# Patient Record
Sex: Female | Born: 1960 | Race: White | Hispanic: No | Smoking: Former smoker
Health system: Southern US, Community
[De-identification: ages and names within clinical notes are randomized; demographics above are authoritative.]

## PROBLEM LIST (undated history)

## (undated) DIAGNOSIS — N3281 Overactive bladder: Secondary | ICD-10-CM

## (undated) DIAGNOSIS — E119 Type 2 diabetes mellitus without complications: Secondary | ICD-10-CM

## (undated) DIAGNOSIS — C4490 Unspecified malignant neoplasm of skin, unspecified: Secondary | ICD-10-CM

## (undated) DIAGNOSIS — F419 Anxiety disorder, unspecified: Secondary | ICD-10-CM

## (undated) DIAGNOSIS — F411 Generalized anxiety disorder: Secondary | ICD-10-CM

## (undated) DIAGNOSIS — G4730 Sleep apnea, unspecified: Secondary | ICD-10-CM

## (undated) DIAGNOSIS — E785 Hyperlipidemia, unspecified: Secondary | ICD-10-CM

## (undated) DIAGNOSIS — R42 Dizziness and giddiness: Secondary | ICD-10-CM

## (undated) DIAGNOSIS — S82899A Other fracture of unspecified lower leg, initial encounter for closed fracture: Secondary | ICD-10-CM

## (undated) DIAGNOSIS — Z972 Presence of dental prosthetic device (complete) (partial): Secondary | ICD-10-CM

## (undated) HISTORY — PX: BASAL CELL CARCINOMA EXCISION: SHX1214

## (undated) HISTORY — DX: Other fracture of unspecified lower leg, initial encounter for closed fracture: S82.899A

## (undated) HISTORY — DX: Overactive bladder: N32.81

---

## 2004-06-30 ENCOUNTER — Ambulatory Visit: Payer: Self-pay

## 2007-12-03 ENCOUNTER — Ambulatory Visit: Payer: Self-pay

## 2007-12-28 ENCOUNTER — Emergency Department: Payer: Self-pay | Admitting: Internal Medicine

## 2008-01-31 ENCOUNTER — Ambulatory Visit: Payer: Self-pay | Admitting: Unknown Physician Specialty

## 2008-02-06 ENCOUNTER — Ambulatory Visit: Payer: Self-pay | Admitting: Unknown Physician Specialty

## 2008-07-13 ENCOUNTER — Ambulatory Visit: Payer: Self-pay | Admitting: Unknown Physician Specialty

## 2008-07-16 ENCOUNTER — Inpatient Hospital Stay: Payer: Self-pay | Admitting: Unknown Physician Specialty

## 2008-09-11 HISTORY — PX: TOTAL ABDOMINAL HYSTERECTOMY: SHX209

## 2008-09-11 HISTORY — PX: ABDOMINAL HYSTERECTOMY: SHX81

## 2008-11-14 ENCOUNTER — Emergency Department: Payer: Self-pay | Admitting: Emergency Medicine

## 2010-05-06 ENCOUNTER — Ambulatory Visit (HOSPITAL_BASED_OUTPATIENT_CLINIC_OR_DEPARTMENT_OTHER): Admission: RE | Admit: 2010-05-06 | Discharge: 2010-05-06 | Payer: Self-pay | Admitting: Orthopedic Surgery

## 2010-09-11 DIAGNOSIS — C4490 Unspecified malignant neoplasm of skin, unspecified: Secondary | ICD-10-CM

## 2010-09-11 HISTORY — DX: Unspecified malignant neoplasm of skin, unspecified: C44.90

## 2010-11-25 LAB — POCT HEMOGLOBIN-HEMACUE: Hemoglobin: 14.6 g/dL (ref 12.0–15.0)

## 2011-05-24 ENCOUNTER — Ambulatory Visit: Payer: Self-pay | Admitting: Family Medicine

## 2011-07-02 ENCOUNTER — Ambulatory Visit: Payer: Self-pay

## 2011-09-06 ENCOUNTER — Ambulatory Visit: Payer: Self-pay | Admitting: Family Medicine

## 2013-09-08 ENCOUNTER — Emergency Department: Payer: Self-pay | Admitting: Emergency Medicine

## 2015-10-22 DIAGNOSIS — F411 Generalized anxiety disorder: Secondary | ICD-10-CM | POA: Insufficient documentation

## 2015-10-22 DIAGNOSIS — E1169 Type 2 diabetes mellitus with other specified complication: Secondary | ICD-10-CM | POA: Insufficient documentation

## 2015-10-22 DIAGNOSIS — E7800 Pure hypercholesterolemia, unspecified: Secondary | ICD-10-CM | POA: Insufficient documentation

## 2015-10-25 ENCOUNTER — Other Ambulatory Visit: Payer: Self-pay | Admitting: Family Medicine

## 2015-10-25 DIAGNOSIS — Z1231 Encounter for screening mammogram for malignant neoplasm of breast: Secondary | ICD-10-CM

## 2015-11-23 ENCOUNTER — Ambulatory Visit
Admission: RE | Admit: 2015-11-23 | Discharge: 2015-11-23 | Disposition: A | Discharge status: Home / Self Care | Payer: BLUE CROSS/BLUE SHIELD | Source: Ambulatory Visit | Attending: Family Medicine | Admitting: Family Medicine

## 2015-11-23 DIAGNOSIS — Z1231 Encounter for screening mammogram for malignant neoplasm of breast: Secondary | ICD-10-CM | POA: Diagnosis present

## 2015-11-23 HISTORY — DX: Unspecified malignant neoplasm of skin, unspecified: C44.90

## 2015-11-26 ENCOUNTER — Other Ambulatory Visit: Payer: Self-pay | Admitting: Family Medicine

## 2015-11-26 DIAGNOSIS — R928 Other abnormal and inconclusive findings on diagnostic imaging of breast: Secondary | ICD-10-CM

## 2015-12-06 ENCOUNTER — Ambulatory Visit
Admission: RE | Admit: 2015-12-06 | Discharge: 2015-12-06 | Disposition: A | Discharge status: Home / Self Care | Payer: BLUE CROSS/BLUE SHIELD | Source: Ambulatory Visit | Attending: Family Medicine | Admitting: Family Medicine

## 2015-12-06 DIAGNOSIS — R928 Other abnormal and inconclusive findings on diagnostic imaging of breast: Secondary | ICD-10-CM | POA: Insufficient documentation

## 2015-12-17 ENCOUNTER — Encounter: Payer: Self-pay | Admitting: *Deleted

## 2015-12-20 ENCOUNTER — Encounter: Payer: Self-pay | Admitting: *Deleted

## 2015-12-20 ENCOUNTER — Encounter
Admission: RE | Disposition: A | Discharge status: Home / Self Care | Payer: Self-pay | Source: Ambulatory Visit | Attending: Gastroenterology

## 2015-12-20 ENCOUNTER — Ambulatory Visit
Admission: RE | Admit: 2015-12-20 | Discharge: 2015-12-20 | Disposition: A | Discharge status: Home / Self Care | Payer: BLUE CROSS/BLUE SHIELD | Source: Ambulatory Visit | Attending: Gastroenterology | Admitting: Gastroenterology

## 2015-12-20 ENCOUNTER — Ambulatory Visit: Payer: BLUE CROSS/BLUE SHIELD | Admitting: *Deleted

## 2015-12-20 DIAGNOSIS — E785 Hyperlipidemia, unspecified: Secondary | ICD-10-CM | POA: Diagnosis not present

## 2015-12-20 DIAGNOSIS — D125 Benign neoplasm of sigmoid colon: Secondary | ICD-10-CM | POA: Insufficient documentation

## 2015-12-20 DIAGNOSIS — Z87891 Personal history of nicotine dependence: Secondary | ICD-10-CM | POA: Insufficient documentation

## 2015-12-20 DIAGNOSIS — D122 Benign neoplasm of ascending colon: Secondary | ICD-10-CM | POA: Diagnosis not present

## 2015-12-20 DIAGNOSIS — Z90710 Acquired absence of both cervix and uterus: Secondary | ICD-10-CM | POA: Insufficient documentation

## 2015-12-20 DIAGNOSIS — D124 Benign neoplasm of descending colon: Secondary | ICD-10-CM | POA: Insufficient documentation

## 2015-12-20 DIAGNOSIS — Z6834 Body mass index (BMI) 34.0-34.9, adult: Secondary | ICD-10-CM | POA: Insufficient documentation

## 2015-12-20 DIAGNOSIS — Z1211 Encounter for screening for malignant neoplasm of colon: Secondary | ICD-10-CM | POA: Diagnosis not present

## 2015-12-20 DIAGNOSIS — F419 Anxiety disorder, unspecified: Secondary | ICD-10-CM | POA: Insufficient documentation

## 2015-12-20 DIAGNOSIS — Z79899 Other long term (current) drug therapy: Secondary | ICD-10-CM | POA: Insufficient documentation

## 2015-12-20 DIAGNOSIS — E669 Obesity, unspecified: Secondary | ICD-10-CM | POA: Insufficient documentation

## 2015-12-20 DIAGNOSIS — Z85828 Personal history of other malignant neoplasm of skin: Secondary | ICD-10-CM | POA: Insufficient documentation

## 2015-12-20 DIAGNOSIS — Z8371 Family history of colonic polyps: Secondary | ICD-10-CM | POA: Insufficient documentation

## 2015-12-20 HISTORY — PX: COLONOSCOPY WITH PROPOFOL: SHX5780

## 2015-12-20 HISTORY — DX: Anxiety disorder, unspecified: F41.9

## 2015-12-20 HISTORY — DX: Hyperlipidemia, unspecified: E78.5

## 2015-12-20 SURGERY — COLONOSCOPY WITH PROPOFOL
Anesthesia: General

## 2015-12-20 MED ORDER — SODIUM CHLORIDE 0.9 % IV SOLN: INTRAVENOUS | Status: DC

## 2015-12-20 MED ORDER — SODIUM CHLORIDE 0.9 % IV SOLN
INTRAVENOUS | Status: DC
  Administered 2015-12-20: 1000 mL via INTRAVENOUS

## 2015-12-20 MED ORDER — PROPOFOL 500 MG/50ML IV EMUL
INTRAVENOUS | Status: DC | PRN
  Administered 2015-12-20: 125 ug/kg/min via INTRAVENOUS

## 2015-12-20 MED ORDER — PROPOFOL 10 MG/ML IV BOLUS
INTRAVENOUS | Status: DC | PRN
  Administered 2015-12-20: 60 mg via INTRAVENOUS

## 2015-12-20 MED ORDER — LIDOCAINE HCL (PF) 2 % IJ SOLN
INTRAMUSCULAR | Status: DC | PRN
  Administered 2015-12-20: 80 mg via INTRADERMAL

## 2015-12-20 NOTE — Anesthesia Postprocedure Evaluation (Signed)
Anesthesia Post Note  Patient: Karen Lambert  Procedure(s) Performed: Procedure(s) (LRB): COLONOSCOPY WITH PROPOFOL (N/A)  Patient location during evaluation: Endoscopy Anesthesia Type: General Level of consciousness: awake Pain management: pain level controlled Vital Signs Assessment: post-procedure vital signs reviewed and stable Respiratory status: spontaneous breathing Cardiovascular status: blood pressure returned to baseline Postop Assessment: no headache Anesthetic complications: no    Last Vitals:  Filed Vitals:   12/20/15 1053 12/20/15 1100  BP:  121/67  Pulse: 64 69  Temp:    Resp: 12 10    Last Pain: There were no vitals filed for this visit.               Satori Krabill M

## 2015-12-20 NOTE — Op Note (Signed)
[facility] Gastroenterology Patient Name: Karen Lambert Procedure Date: 12/20/2015 10:10 AM MRN: [id number] Account #: [id number] Date of Birth: 02/22/1961 Admit Type: Outpatient Age: 55 Room: [facility] ENDO ROOM 4 Gender: Female Note Status: Finalized Procedure:            Colonoscopy Indications:          Colon cancer screening in patient at increased risk:                        Family history of 1st-degree relative with colon polyps Providers:            Lupita Dawn. Candace Cruise, MD Referring MD:         Sofie Hartigan (Referring MD) Medicines:            Monitored Anesthesia Care Complications:        No immediate complications. Procedure:            Pre-Anesthesia Assessment:                       - Prior to the procedure, a History and Physical was                        performed, and patient medications, allergies and                        sensitivities were reviewed. The patient's tolerance of                        previous anesthesia was reviewed.                       - The risks and benefits of the procedure and the                        sedation options and risks were discussed with the                        patient. All questions were answered and informed                        consent was obtained.                       - After reviewing the risks and benefits, the patient                        was deemed in satisfactory condition to undergo the                        procedure.                       After obtaining informed consent, the colonoscope was                        passed under direct vision. Throughout the procedure,                        the patient's blood pressure, pulse, and oxygen  saturations were monitored continuously. The                        Colonoscope was introduced through the anus and                        advanced to the the cecum, identified by appendiceal                        orifice and ileocecal  valve. The colonoscopy was                        performed without difficulty. The patient tolerated the                        procedure well. Findings:      Three sessile polyps were found in the sigmoid colon and descending       colon. The polyps were small in size. These polyps were removed with a       hot snare. Resection and retrieval were complete.      A diminutive polyp was found in the ascending colon. The polyp was       sessile. The polyp was removed with a jumbo cold forceps. Resection and       retrieval were complete.      The exam was otherwise without abnormality. Impression:           - Three small polyps in the sigmoid colon and in the                        descending colon, removed with a hot snare. Resected                        and retrieved.                       - One diminutive polyp in the ascending colon, removed                        with a jumbo cold forceps. Resected and retrieved.                       - The examination was otherwise normal. Recommendation:       - Discharge patient to home.                       - Await pathology results.                       - Repeat colonoscopy in 5 years for surveillance based                        on pathology results.                       - The findings and recommendations were discussed with                        the patient. Procedure Code(s):    --- Professional ---  45385, Colonoscopy, flexible; with removal of tumor(s),                        polyp(s), or other lesion(s) by snare technique                       45380, 59, Colonoscopy, flexible; with biopsy, single                        or multiple Diagnosis Code(s):    --- Professional ---                       Z83.71, Family history of colonic polyps                       D12.5, Benign neoplasm of sigmoid colon                       D12.4, Benign neoplasm of descending colon                       D12.2, Benign neoplasm of  ascending colon CPT copyright 2016 American Medical Association. All rights reserved. The codes documented in this report are preliminary and upon coder review may  be revised to meet current compliance requirements. Hulen Luster, MD 12/20/2015 10:39:01 AM This report has been signed electronically. Number of Addenda: 0 Note Initiated On: 12/20/2015 10:10 AM Scope Withdrawal Time: 0 hours 15 minutes 39 seconds  Total Procedure Duration: 0 hours 19 minutes 55 seconds       [facility]

## 2015-12-20 NOTE — Anesthesia Preprocedure Evaluation (Signed)
Anesthesia Evaluation  Patient identified by MRN, date of birth, ID band Patient awake    Reviewed: Allergy & Precautions, NPO status , Patient's Chart, lab work & pertinent test results  Airway Mallampati: I  TM Distance: >3 FB Neck ROM: Full    Dental  (+) Teeth Intact   Pulmonary former smoker,    Pulmonary exam normal        Cardiovascular Exercise Tolerance: Good negative cardio ROS Normal cardiovascular exam     Neuro/Psych    GI/Hepatic negative GI ROS,   Endo/Other  negative endocrine ROS  Renal/GU      Musculoskeletal   Abdominal (+) + obese,  Abdomen: soft.    Peds  Hematology   Anesthesia Other Findings   Reproductive/Obstetrics                             Anesthesia Physical Anesthesia Plan  ASA: II  Anesthesia Plan: General   Post-op Pain Management:    Induction: Intravenous  Airway Management Planned: Nasal Cannula  Additional Equipment:   Intra-op Plan:   Post-operative Plan:   Informed Consent: I have reviewed the patients History and Physical, chart, labs and discussed the procedure including the risks, benefits and alternatives for the proposed anesthesia with the patient or authorized representative who has indicated his/her understanding and acceptance.     Plan Discussed with: CRNA  Anesthesia Plan Comments:         Anesthesia Quick Evaluation

## 2015-12-20 NOTE — H&P (Signed)
    Primary Care Physician:  [facility], Chrissie Noa, MD Primary Gastroenterologist:  Dr. Candace Cruise  Pre-Procedure History & Physical: HPI:  Karen Lambert is a 55 y.o. female is here for an colonoscopy.  Past Medical History  Diagnosis Date  . Hyperlipidemia   . Anxiety   . Skin cancer 2012    Past Surgical History  Procedure Laterality Date  . Abdominal hysterectomy  2010  . Cesarean section      Prior to Admission medications   Medication Sig Start Date End Date Taking? Authorizing Provider  FLUoxetine (PROZAC) 10 MG capsule Take 10 mg by mouth daily.   Yes Historical Provider, MD  hydrocortisone valerate ointment (WESTCORT) 0.2 % Apply 1 application topically 2 (two) times daily.   Yes Historical Provider, MD    Allergies as of 12/14/2015  . (Not on File)    Family History  Problem Relation Age of Onset  . Breast cancer Neg Hx     Social History   Social History  . Marital Status: Legally Separated    Spouse Name: N/A  . Number of Children: N/A  . Years of Education: N/A   Occupational History  . Not on file.   Social History Main Topics  . Smoking status: Former Research scientist (life sciences)  . Smokeless tobacco: Not on file  . Alcohol Use: Not on file  . Drug Use: Not on file  . Sexual Activity: Not on file   Other Topics Concern  . Not on file   Social History Narrative    Review of Systems: See HPI, otherwise negative ROS  Physical Exam: BP 130/62 mmHg  Pulse 66  Temp(Src) 96.9 F (36.1 C) (Tympanic)  Resp 20  Ht 5' (1.524 m)  Wt 79.833 kg (176 lb)  BMI 34.37 kg/m2  SpO2 100% General:   Alert,  pleasant and cooperative in NAD Head:  Normocephalic and atraumatic. Neck:  Supple; no masses or thyromegaly. Lungs:  Clear throughout to auscultation.    Heart:  Regular rate and rhythm. Abdomen:  Soft, nontender and nondistended. Normal bowel sounds, without guarding, and without rebound.   Neurologic:  Alert and  oriented x4;  grossly normal  neurologically.  Impression/Plan: Karen Lambert is here for an colonoscopy to be performed for screening, family hx of colon polyps  Risks, benefits, limitations, and alternatives regarding  {colonoscopy have been reviewed with the patient.  Questions have been answered.  All parties agreeable.   Tyia Binford, Lupita Dawn, MD  12/20/2015, 10:12 AM

## 2015-12-20 NOTE — Transfer of Care (Signed)
Immediate Anesthesia Transfer of Care Note  Patient: Karen Lambert  Procedure(s) Performed: Procedure(s): COLONOSCOPY WITH PROPOFOL (N/A)  Patient Location: PACU  Anesthesia Type:General  Level of Consciousness: awake, alert  and oriented  Airway & Oxygen Therapy: Patient Spontanous Breathing and Patient connected to nasal cannula oxygen  Post-op Assessment: Report given to RN and Post -op Vital signs reviewed and stable  Post vital signs: Reviewed and stable  Last Vitals:  Filed Vitals:   12/20/15 0940 12/20/15 1040  BP: 130/62 100/63  Pulse: 66 71  Temp: 36.1 C   Resp: 20 13    Complications: No apparent anesthesia complications

## 2015-12-21 ENCOUNTER — Encounter: Payer: Self-pay | Admitting: Gastroenterology

## 2015-12-21 LAB — SURGICAL PATHOLOGY

## 2017-04-03 ENCOUNTER — Ambulatory Visit: Payer: BLUE CROSS/BLUE SHIELD

## 2017-04-03 ENCOUNTER — Ambulatory Visit (INDEPENDENT_AMBULATORY_CARE_PROVIDER_SITE_OTHER): Payer: BLUE CROSS/BLUE SHIELD

## 2017-04-03 ENCOUNTER — Ambulatory Visit (INDEPENDENT_AMBULATORY_CARE_PROVIDER_SITE_OTHER): Payer: BLUE CROSS/BLUE SHIELD | Admitting: Podiatry

## 2017-04-03 DIAGNOSIS — M722 Plantar fascial fibromatosis: Secondary | ICD-10-CM

## 2017-04-03 MED ORDER — MELOXICAM 15 MG PO TABS: 15.0000 mg | ORAL_TABLET | Freq: Every day | ORAL | 1 refills | Status: DC

## 2017-04-03 MED ORDER — BETAMETHASONE SOD PHOS & ACET 6 (3-3) MG/ML IJ SUSP: 3.0000 mg | Freq: Once | INTRAMUSCULAR | Status: DC

## 2017-04-03 NOTE — Progress Notes (Signed)
   Subjective: Patient presents today for pain and tenderness in the feet bilaterally. Patient states the foot pain has been hurting for several weeks now. Patient states that it hurts in the mornings with the first steps out of bed. Patient presents today for further treatment and evaluation The patient recently underwent rotator cuff surgery on the right shoulder. Surgery was performed in [place].  Objective: Physical Exam General: The patient is alert and oriented x3 in no acute distress.  Dermatology: Skin is warm, dry and supple bilateral lower extremities. Negative for open lesions or macerations bilateral.   Vascular: Dorsalis Pedis and Posterior Tibial pulses palpable bilateral.  Capillary fill time is immediate to all digits.  Neurological: Epicritic and protective threshold intact bilateral.   Musculoskeletal: Tenderness to palpation at the medial calcaneal tubercale and through the insertion of the plantar fascia of the bilateral feet. All other joints range of motion within normal limits bilateral. Strength 5/5 in all groups bilateral.   Radiographic exam: Normal osseous mineralization. Joint spaces preserved. No fracture/dislocation/boney destruction. Calcaneal spur present with mild thickening of plantar fascia bilateral. No other soft tissue abnormalities or radiopaque foreign bodies.   Assessment: 1. plantar fasciitis bilateral feet  Plan of Care:  1. Patient evaluated. Xrays reviewed.   2. Injection of 0.5cc Celestone soluspan injected into the bilateral heels.  3. Prescription for meloxicam 15 mg daily  4. patient regarding therapies and modalities at home to alleviate symptoms.  6. Return to clinic in 4 weeks.    Patient's insurance covers 70% after a $489 remaining deductible  Edrick Kins, DPM [facility]  Dr. Edrick Kins, DPM    [address], [place] [address]                Office  [phone]  Fax [phone]

## 2017-05-01 ENCOUNTER — Ambulatory Visit (INDEPENDENT_AMBULATORY_CARE_PROVIDER_SITE_OTHER): Payer: BLUE CROSS/BLUE SHIELD | Admitting: Podiatry

## 2017-05-01 DIAGNOSIS — M722 Plantar fascial fibromatosis: Secondary | ICD-10-CM

## 2017-05-09 MED ORDER — BETAMETHASONE SOD PHOS & ACET 6 (3-3) MG/ML IJ SUSP: 3.0000 mg | Freq: Once | INTRAMUSCULAR | Status: DC

## 2017-05-09 NOTE — Progress Notes (Signed)
   Subjective: Patient presents today for follow-up evaluation of plantar fasciitis bilateral. Patient states that her feet feel better however she still gets some aches and pain at night when she removes the plantar fascial brace. She believes the meloxicam and injections helped.  Objective: Physical Exam General: The patient is alert and oriented x3 in no acute distress.  Dermatology: Skin is warm, dry and supple bilateral lower extremities. Negative for open lesions or macerations bilateral.   Vascular: Dorsalis Pedis and Posterior Tibial pulses palpable bilateral.  Capillary fill time is immediate to all digits.  Neurological: Epicritic and protective threshold intact bilateral.   Musculoskeletal: Tenderness to palpation at the medial calcaneal tubercale and through the insertion of the plantar fascia of the bilateral feet. All other joints range of motion within normal limits bilateral. Strength 5/5 in all groups bilateral.    Assessment: 1. plantar fasciitis bilateral feet  Plan of Care:  1. Patient evaluated.  2. Injection of 0.5cc Celestone soluspan injected into the bilateral heels.  3. Continue meloxicam and plantar fascial brace 4. Today I stressed the importance of stretching exercises and modalities to stretch the calf and plantar fascia. Recommend stretching 3 times daily 5. Handwritten Prescription for physical therapy to take to [place], since that is where she is undergoing physical therapy for rotator cuff surgery 6. Return to clinic in 6 weeks  Patient's insurance covers 70% after a $489 remaining deductible  Edrick Kins, DPM [facility]  Dr. Edrick Kins, DPM    [address], [place] [address]                Office [phone]  Fax [phone]

## 2017-06-03 ENCOUNTER — Other Ambulatory Visit: Payer: Self-pay | Admitting: Podiatry

## 2017-06-12 ENCOUNTER — Ambulatory Visit: Payer: BLUE CROSS/BLUE SHIELD | Admitting: Podiatry

## 2017-09-11 HISTORY — PX: ROTATOR CUFF REPAIR: SHX139

## 2017-09-19 DIAGNOSIS — M722 Plantar fascial fibromatosis: Secondary | ICD-10-CM | POA: Insufficient documentation

## 2018-01-17 DIAGNOSIS — N3281 Overactive bladder: Secondary | ICD-10-CM | POA: Insufficient documentation

## 2018-01-17 DIAGNOSIS — J301 Allergic rhinitis due to pollen: Secondary | ICD-10-CM | POA: Insufficient documentation

## 2018-01-17 DIAGNOSIS — E559 Vitamin D deficiency, unspecified: Secondary | ICD-10-CM | POA: Insufficient documentation

## 2018-06-26 DIAGNOSIS — M75101 Unspecified rotator cuff tear or rupture of right shoulder, not specified as traumatic: Secondary | ICD-10-CM | POA: Insufficient documentation

## 2018-11-12 ENCOUNTER — Encounter: Payer: Self-pay | Admitting: Nurse Practitioner

## 2018-11-12 DIAGNOSIS — Z87898 Personal history of other specified conditions: Secondary | ICD-10-CM | POA: Insufficient documentation

## 2018-11-13 ENCOUNTER — Other Ambulatory Visit: Payer: Self-pay

## 2018-11-13 ENCOUNTER — Encounter: Payer: Self-pay | Admitting: Nurse Practitioner

## 2018-11-13 ENCOUNTER — Ambulatory Visit (INDEPENDENT_AMBULATORY_CARE_PROVIDER_SITE_OTHER): Payer: BLUE CROSS/BLUE SHIELD | Admitting: Nurse Practitioner

## 2018-11-13 VITALS — BP 131/78 | HR 82 | Temp 97.9°F | Ht 60.5 in | Wt 167.0 lb

## 2018-11-13 DIAGNOSIS — E6609 Other obesity due to excess calories: Secondary | ICD-10-CM | POA: Diagnosis not present

## 2018-11-13 DIAGNOSIS — Z6832 Body mass index (BMI) 32.0-32.9, adult: Secondary | ICD-10-CM

## 2018-11-13 DIAGNOSIS — N3281 Overactive bladder: Secondary | ICD-10-CM

## 2018-11-13 DIAGNOSIS — F411 Generalized anxiety disorder: Secondary | ICD-10-CM

## 2018-11-13 DIAGNOSIS — E669 Obesity, unspecified: Secondary | ICD-10-CM | POA: Insufficient documentation

## 2018-11-13 DIAGNOSIS — E7800 Pure hypercholesterolemia, unspecified: Secondary | ICD-10-CM

## 2018-11-13 NOTE — Assessment & Plan Note (Signed)
ASCVD 3.8% based on recent lipid panel in January.  Recommend diet changes at this time and repeat lipid panel at physical.

## 2018-11-13 NOTE — Patient Instructions (Addendum)
Over time and in combination with inflammation and other factors, this contributes to plaque which in turn may lead to stroke and/or heart attack down the road.  Sometimes high LDL is primarily genetic, and people might be eating all the right foods but still have high numbers.  Other times, there is room for improvement in one's diet and eating healthier can bring this number down and potentially reduce one's risk of heart attack and/or stroke. To reduce your LDL, Remember - more fruits and vegetables, more fish, and limitnred meat and dairy products.  More soy, nuts, beans, barley, lentils, oats and plant sterol ester enriched margarine instead of butter.  I also encourage eliminating sugar and processed food.  Remember, shop on the outside of the grocery store and visit your [employer].   Fat and Cholesterol Restricted Eating Plan Getting too much fat and cholesterol in your diet may cause health problems. Choosing the right foods helps keep your fat and cholesterol at normal levels. This can keep you from getting certain diseases. Your doctor may recommend an eating plan that includes:  Total fat: ______% or less of total calories a day.  Saturated fat: ______% or less of total calories a day.  Cholesterol: less than _________mg a day.  Fiber: ______g a day. What are tips for following this plan? Meal planning  At meals, divide your plate into four equal parts: ? Fill one-half of your plate with vegetables and green salads. ? Fill one-fourth of your plate with whole grains. ? Fill one-fourth of your plate with low-fat (lean) protein foods.  Eat fish that is high in omega-3 fats at least two times a week. This includes mackerel, tuna, sardines, and salmon.  Eat foods that are high in fiber, such as whole grains, beans, apples, broccoli, carrots, peas, and barley. General tips   Work with your doctor to lose weight if you need to.  Avoid: ? Foods with added sugar. ? Fried  foods. ? Foods with partially hydrogenated oils.  Limit alcohol intake to no more than 1 drink a day for nonpregnant women and 2 drinks a day for men. One drink equals 12 oz of beer, 5 oz of wine, or 1 oz of hard liquor. Reading food labels  Check food labels for: ? Trans fats. ? Partially hydrogenated oils. ? Saturated fat (g) in each serving. ? Cholesterol (mg) in each serving. ? Fiber (g) in each serving.  Choose foods with healthy fats, such as: ? Monounsaturated fats. ? Polyunsaturated fats. ? Omega-3 fats.  Choose grain products that have whole grains. Look for the word "whole" as the first word in the ingredient list. Cooking  Cook foods using low-fat methods. These include baking, boiling, grilling, and broiling.  Eat more home-cooked foods. Eat at restaurants and buffets less often.  Avoid cooking using saturated fats, such as butter, cream, palm oil, palm kernel oil, and coconut oil. Recommended foods  Fruits  All fresh, canned (in natural juice), or frozen fruits. Vegetables  Fresh or frozen vegetables (raw, steamed, roasted, or grilled). Green salads. Grains  Whole grains, such as whole wheat or whole grain breads, crackers, cereals, and pasta. Unsweetened oatmeal, bulgur, barley, quinoa, or brown rice. Corn or whole wheat flour tortillas. Meats and other protein foods  Ground beef (85% or leaner), grass-fed beef, or beef trimmed of fat. Skinless chicken or [place]. Ground chicken or [place]. Pork trimmed of fat. All fish and seafood. Egg whites. Dried beans, peas, or lentils. Unsalted nuts or  seeds. Unsalted canned beans. Nut butters without added sugar or oil. Dairy  Low-fat or nonfat dairy products, such as skim or 1% milk, 2% or reduced-fat cheeses, low-fat and fat-free ricotta or cottage cheese, or plain low-fat and nonfat yogurt. Fats and oils  Tub margarine without trans fats. Light or reduced-fat mayonnaise and salad dressings. Avocado. Olive, canola,  sesame, or safflower oils. The items listed above may not be a complete list of foods and beverages you can eat. Contact a dietitian for more information. Foods to avoid Fruits  Canned fruit in heavy syrup. Fruit in cream or butter sauce. Fried fruit. Vegetables  Vegetables cooked in cheese, cream, or butter sauce. Fried vegetables. Grains  White bread. White pasta. White rice. Cornbread. Bagels, pastries, and croissants. Crackers and snack foods that contain trans fat and hydrogenated oils. Meats and other protein foods  Fatty cuts of meat. Ribs, chicken wings, bacon, sausage, bologna, salami, chitterlings, fatback, hot dogs, bratwurst, and packaged lunch meats. Liver and organ meats. Whole eggs and egg yolks. Chicken and [place] with skin. Fried meat. Dairy  Whole or 2% milk, cream, half-and-half, and cream cheese. Whole milk cheeses. Whole-fat or sweetened yogurt. Full-fat cheeses. Nondairy creamers and whipped toppings. Processed cheese, cheese spreads, and cheese curds. Beverages  Alcohol. Sugar-sweetened drinks such as sodas, lemonade, and fruit drinks. Fats and oils  Butter, stick margarine, lard, shortening, ghee, or bacon fat. Coconut, palm kernel, and palm oils. Sweets and desserts  Corn syrup, sugars, honey, and molasses. Candy. Jam and jelly. Syrup. Sweetened cereals. Cookies, pies, cakes, donuts, muffins, and ice cream. The items listed above may not be a complete list of foods and beverages you should avoid. Contact a dietitian for more information. Summary  Choosing the right foods helps keep your fat and cholesterol at normal levels. This can keep you from getting certain diseases.  At meals, fill one-half of your plate with vegetables and green salads.  Eat high-fiber foods, like whole grains, beans, apples, carrots, peas, and barley.  Limit added sugar, saturated fats, alcohol, and fried foods. This information is not intended to replace advice given to you by  your health care provider. Make sure you discuss any questions you have with your health care provider. Document Released: 02/27/2012 Document Revised: 05/01/2018 Document Reviewed: 05/15/2017 Elsevier Interactive Patient Education  2019 [employer].

## 2018-11-13 NOTE — Assessment & Plan Note (Signed)
Recommend continued focus on health diet choices and regular physical activity (30 minutes 5 days a week). 

## 2018-11-13 NOTE — Assessment & Plan Note (Signed)
Chronic, ongoing.  Recommend increasing Sertraline to 50 MG at home with her current medication (25 MG tablet x 2) and if tolerates then will send in 50 MG daily script.

## 2018-11-13 NOTE — Assessment & Plan Note (Signed)
Chronic, ongoing.  Continue current medication regimen.   

## 2018-11-13 NOTE — Progress Notes (Signed)
New Patient Office Visit  Subjective:  Patient ID: Karen Lambert, female    DOB: October 22, 1960  Age: 58 y.o. MRN: [id number]  CC:  Chief Complaint  Patient presents with  . Establish Care    HPI Karen Lambert presents for new patient visit to establish care.  Introduced to Designer, jewellery role and practice setting.  All questions answered.  Denies any current acute issues or concerns today.  Previously receiving care at [facility], reports having to change due to insurance.  ANXIETY/STRESS Duration:stable Anxious mood: yes  Excessive worrying: yes Irritability: no  Sweating: no Nausea: no Palpitations:no Hyperventilation: no Panic attacks: no Agoraphobia: no  Obscessions/compulsions: no Depressed mood: occasional Depression screen PHQ 2/9 11/13/2018  Decreased Interest 1  Down, Depressed, Hopeless 1  PHQ - 2 Score 2  Altered sleeping 2  Tired, decreased energy 2  Change in appetite 2  Feeling bad or failure about yourself  1  Trouble concentrating 1  Moving slowly or fidgety/restless 0  Suicidal thoughts 0  PHQ-9 Score 10  Difficult doing work/chores Somewhat difficult   Anhedonia: no Weight changes: yes , gain some Insomnia: yes hard to fall asleep  Hypersomnia: no Fatigue/loss of energy: yes Feelings of worthlessness: no Feelings of guilt: no Impaired concentration/indecisiveness: finds hard to concentrate when more depressed Suicidal ideations: no  Crying spells: no Recent Stressors/Life Changes: no   Relationship problems: no   Family stress: no     Financial stress: no    Job stress: no    Recent death/loss: no  OVERACTIVE BLADDER: Currently reports good control with Detrol.  Reports occasional dribbling with laughing too hard or coughing.  Past UTI treated in September and October.  No recent UTI.  VITAMIN D DEFICIENCY: Reports h/o Vitamin D deficiency.  Not currently taking supplement.  No recent level.   Past Medical History:  Diagnosis  Date  . Anxiety   . Hyperlipidemia   . Skin cancer 2012    Past Surgical History:  Procedure Laterality Date  . ABDOMINAL HYSTERECTOMY  2010  . CESAREAN SECTION    . COLONOSCOPY WITH PROPOFOL N/A 12/20/2015   Procedure: COLONOSCOPY WITH PROPOFOL;  Surgeon: Hulen Luster, MD;  Location: [facility] ENDOSCOPY;  Service: Gastroenterology;  Laterality: N/A;    Family History  Problem Relation Age of Onset  . Heart disease Mother   . Diabetes Mother   . Cataracts Mother   . Cancer Father   . Diabetes Father   . Stroke Father   . Stroke Maternal Grandfather   . Breast cancer Neg Hx     Social History   Socioeconomic History  . Marital status: Legally Separated    Spouse name: Not on file  . Number of children: Not on file  . Years of education: Not on file  . Highest education level: Not on file  Occupational History  . Not on file  Social Needs  . Financial resource strain: Not on file  . Food insecurity:    Worry: Not on file    Inability: Not on file  . Transportation needs:    Medical: Not on file    Non-medical: Not on file  Tobacco Use  . Smoking status: Former Research scientist (life sciences)  . Smokeless tobacco: Never Used  Substance and Sexual Activity  . Alcohol use: Yes    Alcohol/week: 1.0 standard drinks    Types: 1 Glasses of wine per week  . Drug use: Not Currently  . Sexual activity: Not Currently  Lifestyle  . Physical activity:    Days per week: Not on file    Minutes per session: Not on file  . Stress: Not on file  Relationships  . Social connections:    Talks on phone: Not on file    Gets together: Not on file    Attends religious service: Not on file    Active member of club or organization: Not on file    Attends meetings of clubs or organizations: Not on file    Relationship status: Not on file  . Intimate partner violence:    Fear of current or ex partner: Not on file    Emotionally abused: Not on file    Physically abused: Not on file    Forced sexual activity: Not  on file  Other Topics Concern  . Not on file  Social History Narrative  . Not on file    ROS Review of Systems  Constitutional: Negative for activity change, appetite change, diaphoresis, fatigue and fever.  Respiratory: Negative for cough, chest tightness and shortness of breath.   Cardiovascular: Negative for chest pain, palpitations and leg swelling.  Gastrointestinal: Negative for abdominal distention, abdominal pain, constipation, diarrhea, nausea and vomiting.  Endocrine: Negative for cold intolerance, heat intolerance, polydipsia, polyphagia and polyuria.  Neurological: Negative for dizziness, syncope, weakness, light-headedness, numbness and headaches.  Psychiatric/Behavioral: Positive for sleep disturbance. Negative for decreased concentration and self-injury. The patient is nervous/anxious.     Objective:   Today's Vitals: BP 131/78   Pulse 82   Temp 97.9 F (36.6 C) (Oral)   Ht 5' 0.5" (1.537 m)   Wt 167 lb (75.8 kg)   BMI 32.08 kg/m   Physical Exam Vitals signs and nursing note reviewed.  Constitutional:      Appearance: She is well-developed.  HENT:     Head: Normocephalic.  Eyes:     General:        Right eye: No discharge.        Left eye: No discharge.     Conjunctiva/sclera: Conjunctivae normal.     Pupils: Pupils are equal, round, and reactive to light.  Neck:     Musculoskeletal: Normal range of motion and neck supple.     Thyroid: No thyromegaly.     Vascular: No carotid bruit or JVD.  Cardiovascular:     Rate and Rhythm: Normal rate and regular rhythm.     Heart sounds: Normal heart sounds. No murmur. No gallop.   Pulmonary:     Effort: Pulmonary effort is normal.     Breath sounds: Normal breath sounds.  Abdominal:     General: Bowel sounds are normal.     Palpations: Abdomen is soft.  Musculoskeletal:     Right lower leg: No edema.     Left lower leg: No edema.  Lymphadenopathy:     Cervical: No cervical adenopathy.  Skin:    General:  Skin is warm and dry.  Neurological:     Mental Status: She is alert and oriented to person, place, and time.  Psychiatric:        Mood and Affect: Mood normal.        Behavior: Behavior normal.        Thought Content: Thought content normal.        Judgment: Judgment normal.     Assessment & Plan:   Problem List Items Addressed This Visit      Genitourinary   OAB (overactive bladder)  Chronic, ongoing.  Continue current medication regimen.          Other   Generalized anxiety disorder - Primary    Chronic, ongoing.  Recommend increasing Sertraline to 50 MG at home with her current medication (25 MG tablet x 2) and if tolerates then will send in 50 MG daily script.        Relevant Medications   sertraline (ZOLOFT) 25 MG tablet   Pure hypercholesterolemia    ASCVD 3.8% based on recent lipid panel in January.  Recommend diet changes at this time and repeat lipid panel at physical.      Obesity    Recommend continued focus on health diet choices and regular physical activity (30 minutes 5 days a week).         Outpatient Encounter Medications as of 11/13/2018  Medication Sig  . Cholecalciferol (VITAMIN D3) 10 MCG (400 UNIT) CAPS Take by mouth.  . gabapentin (NEURONTIN) 300 MG capsule Take by mouth.  . sertraline (ZOLOFT) 25 MG tablet Take by mouth.  . tolterodine (DETROL LA) 2 MG 24 hr capsule Take by mouth.  . [DISCONTINUED] FLUoxetine (PROZAC) 10 MG capsule Take 10 mg by mouth daily.  . [DISCONTINUED] HYDROcodone-acetaminophen (NORCO/VICODIN) 5-325 MG tablet TK 1 T PO  Q 6 H PRN P  . [DISCONTINUED] hydrocortisone valerate ointment (WESTCORT) 0.2 % Apply 1 application topically 2 (two) times daily.  . [DISCONTINUED] meloxicam (MOBIC) 15 MG tablet TAKE 1 TABLET(15 MG) BY MOUTH DAILY  . [DISCONTINUED] ondansetron (ZOFRAN-ODT) 4 MG disintegrating tablet DIS 1 T ON THE TONGUE Q 8 H PRF NAUSEA OR VOM   Facility-Administered Encounter Medications as of 11/13/2018  Medication   . betamethasone acetate-betamethasone sodium phosphate (CELESTONE) injection 3 mg  . betamethasone acetate-betamethasone sodium phosphate (CELESTONE) injection 3 mg    Follow-up: Return in about 2 weeks (around 11/27/2018) for Physical.   Venita Lick, NP

## 2018-11-27 ENCOUNTER — Encounter: Payer: Self-pay | Admitting: Nurse Practitioner

## 2018-11-27 ENCOUNTER — Encounter: Payer: BLUE CROSS/BLUE SHIELD | Admitting: Nurse Practitioner

## 2018-11-27 ENCOUNTER — Other Ambulatory Visit: Payer: Self-pay

## 2018-11-27 ENCOUNTER — Ambulatory Visit (INDEPENDENT_AMBULATORY_CARE_PROVIDER_SITE_OTHER): Payer: BLUE CROSS/BLUE SHIELD | Admitting: Nurse Practitioner

## 2018-11-27 VITALS — BP 120/76 | HR 58 | Temp 97.5°F | Ht 60.0 in

## 2018-11-27 DIAGNOSIS — E6609 Other obesity due to excess calories: Secondary | ICD-10-CM

## 2018-11-27 DIAGNOSIS — R350 Frequency of micturition: Secondary | ICD-10-CM | POA: Diagnosis not present

## 2018-11-27 DIAGNOSIS — E7800 Pure hypercholesterolemia, unspecified: Secondary | ICD-10-CM

## 2018-11-27 DIAGNOSIS — Z1239 Encounter for other screening for malignant neoplasm of breast: Secondary | ICD-10-CM

## 2018-11-27 DIAGNOSIS — F411 Generalized anxiety disorder: Secondary | ICD-10-CM

## 2018-11-27 DIAGNOSIS — E559 Vitamin D deficiency, unspecified: Secondary | ICD-10-CM

## 2018-11-27 DIAGNOSIS — Z6832 Body mass index (BMI) 32.0-32.9, adult: Secondary | ICD-10-CM

## 2018-11-27 DIAGNOSIS — Z0000 Encounter for general adult medical examination without abnormal findings: Secondary | ICD-10-CM | POA: Diagnosis not present

## 2018-11-27 DIAGNOSIS — Z1159 Encounter for screening for other viral diseases: Secondary | ICD-10-CM

## 2018-11-27 DIAGNOSIS — Z23 Encounter for immunization: Secondary | ICD-10-CM | POA: Diagnosis not present

## 2018-11-27 DIAGNOSIS — Z114 Encounter for screening for human immunodeficiency virus [HIV]: Secondary | ICD-10-CM

## 2018-11-27 MED ORDER — CIPROFLOXACIN HCL 500 MG PO TABS: 500.0000 mg | ORAL_TABLET | Freq: Two times a day (BID) | ORAL | 0 refills | Status: AC

## 2018-11-27 MED ORDER — ZOSTER VAC RECOMB ADJUVANTED 50 MCG/0.5ML IM SUSR: 0.5000 mL | Freq: Once | INTRAMUSCULAR | 0 refills | Status: AC

## 2018-11-27 MED ORDER — SERTRALINE HCL 50 MG PO TABS: 50.0000 mg | ORAL_TABLET | Freq: Every day | ORAL | 3 refills | Status: DC

## 2018-11-27 NOTE — Patient Instructions (Addendum)
Please call to this number to schedule your mammogram: [phone]    Mammogram  A mammogram is an X-ray of the breasts that is done to check for changes that are not normal. This test can screen for and find any changes that may suggest breast cancer. This test can also help to find other changes and variations in the breast. What happens before the procedure?  Have this test done about 1-2 weeks after your period. This is usually when your breasts are the least tender.  If you are visiting a new doctor or clinic, send any past mammogram images to your new doctor's office.  Wash your breasts and under your arms the day of the test.  Do not use deodorants, perfumes, lotions, or powders on the day of the test.  Take off any jewelry from your neck.  Wear clothes that you can change into and out of easily. What happens during the procedure?  You will undress from the waist up. You will put on a gown.  You will stand in front of the X-ray machine.  Each breast will be placed between two plastic or glass plates. The plates will press down on your breast for a few seconds. Try to stay as relaxed as possible. This does not cause any harm to your breasts. Any discomfort you feel will be very brief.  X-rays will be taken from different angles of each breast. The procedure may vary among doctors and hospitals. What happens after the procedure?  The mammogram will be looked at by a specialist (radiologist).  You may need to do certain parts of the test again. This depends on the quality of the images.  Ask when your test results will be ready. Make sure you get your test results.  You may go back to your normal activities. This information is not intended to replace advice given to you by your health care provider. Make sure you discuss any questions you have with your health care provider. Document Released: 11/24/2008 Document Revised: 04/12/2017 Document Reviewed: 11/06/2014 Elsevier  Interactive Patient Education  2019 [employer].

## 2018-11-27 NOTE — Assessment & Plan Note (Signed)
Chronic, ongoing.  Continue current medication regimen.  Vitamin D level today.

## 2018-11-27 NOTE — Assessment & Plan Note (Signed)
UA noting trace blood, trace LEU, and few bacteria.  Negative NIT.  Due to symptoms at this time will initiate treatment with Cipro, which has helped before.  Discussed at length aging and vaginal atrophy, with recommendations to use lubricant to vagina as needed and increase hydration.  Use AZO for symptom control.  Return for worsening or continued issues.

## 2018-11-27 NOTE — Assessment & Plan Note (Signed)
Chronic, ongoing.  Continue current medication regimen.  Reports improvement with 50 MG Sertraline.

## 2018-11-27 NOTE — Assessment & Plan Note (Signed)
Recommend continued focus on health diet choices and regular physical activity (30 minutes 5 days a week). 

## 2018-11-27 NOTE — Progress Notes (Signed)
BP 120/76   Pulse (!) 58   Temp (!) 97.5 F (36.4 C) (Oral)   Ht 5' (1.524 m)   SpO2 95%   BMI 32.61 kg/m    Subjective:    Patient ID: Karen Lambert, female    DOB: 1961-04-29, 58 y.o.   MRN: [id number]  HPI: Karen Lambert is a 58 y.o. female presenting on 11/27/2018 for comprehensive medical examination. Current medical complaints include:urinary frequency  She currently lives with: mother Menopausal Symptoms: no   URINARY SYMPTOMS Started yesterday.  Currently taking medication for overactive bladder, Detrol.   Has history of urine infections, treated last 06/27/18 with Cipro.   Dysuria: no Urinary frequency: yes Urgency: yes Small volume voids: yes Symptom severity: yes Urinary incontinence: no Foul odor: no Hematuria: no Abdominal pain: no Back pain: no Suprapubic pain/pressure: yes Flank pain: no Fever:  no Vomiting: no Relief with cranberry juice: yes Relief with pyridium: no Status: stable Previous urinary tract infection: yes Recurrent urinary tract infection: no Sexual activity: monogomous History of sexually transmitted disease: no Treatments attempted: cranberry and increasing fluids   HYPERLIPIDEMIA Labs at [facility] 09/30/18 TRIG 389, TCHOL 238, LDL 119.   Supplements: none Aspirin:  no The ASCVD Risk score Mikey Bussing DC Jr., et al., 2013) failed to calculate for the following reasons:   Cannot find a previous HDL lab   Cannot find a previous total cholesterol lab Chest pain:  no Coronary artery disease:  no Family history CAD:  yes Family history early CAD:  no   VITAMIN D DEFICIENCY: Currently takes 2000 units daily.  No recent falls or fractures.  Denies muscle aches.  ANXIETY/STRESS Increased Sertraline to 50 MG and reports improved mood. Duration:controlled Anxious mood: no  Excessive worrying: no Irritability: no  Sweating: no Nausea: no Palpitations:no Hyperventilation: no Panic attacks: no Agoraphobia: no   Obscessions/compulsions: no Depressed mood: no  Anhedonia: no Weight changes: no Insomnia: none Hypersomnia: no Fatigue/loss of energy: no Feelings of worthlessness: no Feelings of guilt: no Impaired concentration/indecisiveness: no Suicidal ideations: no  Crying spells: no Recent Stressors/Life Changes: no   Relationship problems: no   Family stress: no     Financial stress: no    Job stress: no    Recent death/loss: no  Depression Screen done today and results listed below:  Depression screen [facility] 2/9 11/27/2018 11/13/2018  Decreased Interest 0 1  Down, Depressed, Hopeless 0 1  PHQ - 2 Score 0 2  Altered sleeping 1 2  Tired, decreased energy 2 2  Change in appetite 1 2  Feeling bad or failure about yourself  0 1  Trouble concentrating 1 1  Moving slowly or fidgety/restless 0 0  Suicidal thoughts 0 0  PHQ-9 Score 5 10  Difficult doing work/chores Not difficult at all Somewhat difficult    The patient has a history of falls. I did complete a risk assessment for falls. A plan of care for falls was documented.   Past Medical History:  Past Medical History:  Diagnosis Date  . Anxiety   . Hyperlipidemia   . Skin cancer 2012    Surgical History:  Past Surgical History:  Procedure Laterality Date  . ABDOMINAL HYSTERECTOMY  2010  . CESAREAN SECTION    . COLONOSCOPY WITH PROPOFOL N/A 12/20/2015   Procedure: COLONOSCOPY WITH PROPOFOL;  Surgeon: Hulen Luster, MD;  Location: [facility] ENDOSCOPY;  Service: Gastroenterology;  Laterality: N/A;    Medications:  Current Outpatient Medications on File Prior  to Visit  Medication Sig  . gabapentin (NEURONTIN) 300 MG capsule Take by mouth.  . tolterodine (DETROL LA) 2 MG 24 hr capsule Take by mouth.  . Cholecalciferol (VITAMIN D3) 10 MCG (400 UNIT) CAPS Take by mouth.   Current Facility-Administered Medications on File Prior to Visit  Medication  . betamethasone acetate-betamethasone sodium phosphate (CELESTONE) injection 3 mg  .  betamethasone acetate-betamethasone sodium phosphate (CELESTONE) injection 3 mg    Allergies:  Allergies  Allergen Reactions  . Latex Rash and Shortness Of Breath    Social History:  Social History   Socioeconomic History  . Marital status: Divorced    Spouse name: Not on file  . Number of children: Not on file  . Years of education: Not on file  . Highest education level: Not on file  Occupational History  . Not on file  Social Needs  . Financial resource strain: Not on file  . Food insecurity:    Worry: Not on file    Inability: Not on file  . Transportation needs:    Medical: Not on file    Non-medical: Not on file  Tobacco Use  . Smoking status: Former Research scientist (life sciences)  . Smokeless tobacco: Never Used  Substance and Sexual Activity  . Alcohol use: Yes    Alcohol/week: 1.0 standard drinks    Types: 1 Glasses of wine per week  . Drug use: Not Currently  . Sexual activity: Not Currently  Lifestyle  . Physical activity:    Days per week: Not on file    Minutes per session: Not on file  . Stress: Not on file  Relationships  . Social connections:    Talks on phone: Not on file    Gets together: Not on file    Attends religious service: Not on file    Active member of club or organization: Not on file    Attends meetings of clubs or organizations: Not on file    Relationship status: Not on file  . Intimate partner violence:    Fear of current or ex partner: Not on file    Emotionally abused: Not on file    Physically abused: Not on file    Forced sexual activity: Not on file  Other Topics Concern  . Not on file  Social History Narrative  . Not on file   Social History   Tobacco Use  Smoking Status Former Smoker  Smokeless Tobacco Never Used   Social History   Substance and Sexual Activity  Alcohol Use Yes  . Alcohol/week: 1.0 standard drinks  . Types: 1 Glasses of wine per week    Family History:  Family History  Problem Relation Age of Onset  . Heart  disease Mother   . Diabetes Mother   . Cataracts Mother   . Cancer Father   . Diabetes Father   . Stroke Father   . Stroke Maternal Grandfather   . Breast cancer Neg Hx     Past medical history, surgical history, medications, allergies, family history and social history reviewed with patient today and changes made to appropriate areas of the chart.   Review of Systems - Negative except urinary frequency All other ROS negative except what is listed above and in the HPI.      Objective:    BP 120/76   Pulse (!) 58   Temp (!) 97.5 F (36.4 C) (Oral)   Ht 5' (1.524 m)   SpO2 95%   BMI 32.61 kg/m  Wt Readings from Last 3 Encounters:  11/13/18 167 lb (75.8 kg)  12/20/15 176 lb (79.8 kg)    Physical Exam Vitals signs and nursing note reviewed.  Constitutional:      General: She is awake.     Appearance: She is well-developed and overweight.  HENT:     Head: Normocephalic and atraumatic.     Right Ear: Hearing, tympanic membrane, ear canal and external ear normal.     Left Ear: Hearing, tympanic membrane, ear canal and external ear normal.     Nose: Nose normal.     Right Sinus: No maxillary sinus tenderness or frontal sinus tenderness.     Left Sinus: No maxillary sinus tenderness or frontal sinus tenderness.     Mouth/Throat:     Mouth: Mucous membranes are moist.     Pharynx: Oropharynx is clear. Uvula midline.  Eyes:     General: Lids are normal.        Right eye: No discharge.        Left eye: No discharge.     Conjunctiva/sclera: Conjunctivae normal.     Pupils: Pupils are equal, round, and reactive to light.  Neck:     Musculoskeletal: Normal range of motion and neck supple.     Thyroid: No thyromegaly.     Vascular: No carotid bruit or JVD.  Cardiovascular:     Rate and Rhythm: Normal rate and regular rhythm.     Pulses: Normal pulses.     Heart sounds: Normal heart sounds. No murmur. No gallop.   Pulmonary:     Effort: Pulmonary effort is normal.      Breath sounds: Normal breath sounds.  Chest:     Breasts:        Right: Normal. No swelling, bleeding, inverted nipple, mass, nipple discharge, skin change or tenderness.        Left: Normal. No swelling, bleeding, inverted nipple, mass, nipple discharge, skin change or tenderness.  Abdominal:     General: Bowel sounds are normal.     Palpations: Abdomen is soft. There is no hepatomegaly or splenomegaly.     Tenderness: There is abdominal tenderness in the suprapubic area. There is no right CVA tenderness, left CVA tenderness, guarding or rebound.     Hernia: No hernia is present.  Musculoskeletal: Normal range of motion.     Right lower leg: No edema.     Left lower leg: No edema.  Lymphadenopathy:     Cervical: No cervical adenopathy.  Skin:    General: Skin is warm and dry.  Neurological:     Mental Status: She is alert and oriented to person, place, and time.     Deep Tendon Reflexes: Reflexes are normal and symmetric.     Reflex Scores:      Brachioradialis reflexes are 2+ on the right side and 2+ on the left side.      Patellar reflexes are 2+ on the right side and 2+ on the left side. Psychiatric:        Attention and Perception: Attention normal.        Mood and Affect: Mood normal.        Speech: Speech normal.        Behavior: Behavior normal. Behavior is cooperative.        Thought Content: Thought content normal.        Cognition and Memory: Cognition normal.        Judgment: Judgment normal.  Results for orders placed or performed in visit on 11/27/18  Microscopic Examination  Result Value Ref Range   WBC, UA 0-5 0 - 5 /hpf   RBC, UA 0-2 0 - 2 /hpf   Epithelial Cells (non renal) 0-10 0 - 10 /hpf   Bacteria, UA Few (A) None seen/Few  Urine Culture, Reflex  Result Value Ref Range   Urine Culture, Routine WILL FOLLOW   UA/M w/rflx Culture, Routine  Result Value Ref Range   Specific Gravity, UA 1.015 1.005 - 1.030   pH, UA 6.5 5.0 - 7.5   Color, UA Yellow  Yellow   Appearance Ur Clear Clear   Leukocytes, UA Trace (A) Negative   Protein, UA Negative Negative/Trace   Glucose, UA Negative Negative   Ketones, UA Negative Negative   RBC, UA Trace (A) Negative   Bilirubin, UA Negative Negative   Urobilinogen, Ur 0.2 0.2 - 1.0 mg/dL   Nitrite, UA Negative Negative   Microscopic Examination See below:    Urinalysis Reflex Comment       Assessment & Plan:   Problem List Items Addressed This Visit      Other   Generalized anxiety disorder    Chronic, ongoing.  Continue current medication regimen.  Reports improvement with 50 MG Sertraline.      Relevant Medications   sertraline (ZOLOFT) 50 MG tablet   Pure hypercholesterolemia    Chronic, ongoing.  ASCVD last 3.8%.  Repeat lipid panel today and focus on diet.      Relevant Orders   VITAMIN D 25 Hydroxy (Vit-D Deficiency, Fractures)   Vitamin D deficiency    Chronic, ongoing.  Continue current medication regimen.  Vitamin D level today.      Relevant Orders   Lipid Panel w/o Chol/HDL Ratio   Obesity    Recommend continued focus on health diet choices and regular physical activity (30 minutes 5 days a week).      Urine frequency    UA noting trace blood, trace LEU, and few bacteria.  Negative NIT.  Due to symptoms at this time will initiate treatment with Cipro, which has helped before.  Discussed at length aging and vaginal atrophy, with recommendations to use lubricant to vagina as needed and increase hydration.  Use AZO for symptom control.  Return for worsening or continued issues.      Relevant Orders   UA/M w/rflx Culture, Routine (Completed)   Microscopic Examination (Completed)   Urine Culture, Reflex (Completed)    Other Visit Diagnoses    Encounter for annual physical exam    -  Primary   Relevant Orders   Lipid Panel w/o Chol/HDL Ratio   Need for diphtheria-tetanus-pertussis (Tdap) vaccine       Relevant Orders   Tdap vaccine greater than or equal to 7yo IM  (Completed)   Encounter for screening for HIV       Relevant Orders   HIV Antibody (routine testing w rflx)   Need for hepatitis C screening test       Relevant Orders   Hepatitis C antibody   Breast cancer screening       Relevant Orders   MM DIGITAL SCREENING BILATERAL       Follow up plan: Return in about 6 months (around 05/30/2019) for Follow-up HLD.   LABORATORY TESTING:  - Pap smear: not applicable, history of hysterectomy total  IMMUNIZATIONS:   - Tdap: Tetanus vaccination status reviewed: provided today - Influenza: Up to date -  Pneumovax: Not applicable - Prevnar: Not applicable - HPV: Not applicable - Zostavax vaccine: ordered today  SCREENING: -Mammogram: Ordered today  - Colonoscopy: Up to date  - Bone Density: Not applicable  -Hearing Test: Not applicable  -Spirometry: Not applicable   PATIENT COUNSELING:   Advised to take 1 mg of folate supplement per day if capable of pregnancy.   Sexuality: Discussed sexually transmitted diseases, partner selection, use of condoms, avoidance of unintended pregnancy  and contraceptive alternatives.   Advised to avoid cigarette smoking.  I discussed with the patient that most people either abstain from alcohol or drink within safe limits (<=14/week and <=4 drinks/occasion for males, <=7/weeks and <= 3 drinks/occasion for females) and that the risk for alcohol disorders and other health effects rises proportionally with the number of drinks per week and how often a drinker exceeds daily limits.  Discussed cessation/primary prevention of drug use and availability of treatment for abuse.   Diet: Encouraged to adjust caloric intake to maintain  or achieve ideal body weight, to reduce intake of dietary saturated fat and total fat, to limit sodium intake by avoiding high sodium foods and not adding table salt, and to maintain adequate dietary potassium and calcium preferably from fresh fruits, vegetables, and low-fat dairy  products.    stressed the importance of regular exercise  Injury prevention: Discussed safety belts, safety helmets, smoke detector, smoking near bedding or upholstery.   Dental health: Discussed importance of regular tooth brushing, flossing, and dental visits.    NEXT PREVENTATIVE PHYSICAL DUE IN 1 YEAR. Return in about 6 months (around 05/30/2019) for Follow-up HLD.

## 2018-11-27 NOTE — Assessment & Plan Note (Signed)
Chronic, ongoing.  ASCVD last 3.8%.  Repeat lipid panel today and focus on diet.

## 2018-11-28 ENCOUNTER — Other Ambulatory Visit: Payer: Self-pay | Admitting: Nurse Practitioner

## 2018-11-28 DIAGNOSIS — E559 Vitamin D deficiency, unspecified: Secondary | ICD-10-CM

## 2018-11-28 LAB — HIV ANTIBODY (ROUTINE TESTING W REFLEX): HIV Screen 4th Generation wRfx: NONREACTIVE

## 2018-11-28 LAB — LIPID PANEL W/O CHOL/HDL RATIO
Cholesterol, Total: 222 mg/dL — ABNORMAL HIGH (ref 100–199)
HDL: 42 mg/dL (ref >39)
LDL CALC: 137 mg/dL — AB (ref 0–99)
Triglycerides: 216 mg/dL — ABNORMAL HIGH (ref 0–149)
VLDL Cholesterol Cal: 43 mg/dL — ABNORMAL HIGH (ref 5–40)

## 2018-11-28 LAB — VITAMIN D 25 HYDROXY (VIT D DEFICIENCY, FRACTURES): VIT D 25 HYDROXY: 17.8 ng/mL — AB (ref 30.0–100.0)

## 2018-11-28 LAB — HEPATITIS C ANTIBODY: Hep C Virus Ab: <0.1 s/co ratio (ref 0.0–0.9)

## 2018-11-28 MED ORDER — CHOLECALCIFEROL 1.25 MG (50000 UT) PO CAPS: 50000.0000 [IU] | ORAL_CAPSULE | ORAL | 0 refills | Status: DC

## 2018-11-28 NOTE — Progress Notes (Signed)
Vitamin D level remains low, order for weekly dosing and recheck level in 8 weeks.

## 2018-11-30 LAB — UA/M W/RFLX CULTURE, ROUTINE
BILIRUBIN UA: NEGATIVE
GLUCOSE, UA: NEGATIVE
KETONES UA: NEGATIVE
NITRITE UA: NEGATIVE
Protein, UA: NEGATIVE
SPEC GRAV UA: 1.015 (ref 1.005–1.030)
UUROB: 0.2 mg/dL (ref 0.2–1.0)
pH, UA: 6.5 (ref 5.0–7.5)

## 2018-11-30 LAB — MICROSCOPIC EXAMINATION

## 2018-11-30 LAB — URINE CULTURE, REFLEX

## 2018-12-03 ENCOUNTER — Other Ambulatory Visit: Payer: Self-pay | Admitting: Nurse Practitioner

## 2018-12-03 ENCOUNTER — Telehealth: Payer: Self-pay

## 2018-12-03 DIAGNOSIS — N6320 Unspecified lump in the left breast, unspecified quadrant: Secondary | ICD-10-CM

## 2018-12-03 NOTE — Telephone Encounter (Signed)
Called patient to discuss labs. Patient stated that they need diagnostic orders for her to be able to schedule her mammogram. Called over to [facility] to see exactly what they need. They need orders for:  1. diagnostic bilateral mammogram 2. ultrasound of left breast  3. ultrasound of right breast  They are following up on a left breast mass.   Routing to provider for orders.

## 2018-12-03 NOTE — Telephone Encounter (Signed)
Patient notified. She will call [facility] to schedule.

## 2018-12-03 NOTE — Telephone Encounter (Signed)
Orders placed.  Please alert [facility] and patient

## 2018-12-03 NOTE — Progress Notes (Signed)
Per [facility] orders for following:  Diagnostic bilateral mammogram and u/s left and right breast.

## 2018-12-10 ENCOUNTER — Telehealth: Payer: Self-pay | Admitting: Nurse Practitioner

## 2018-12-10 DIAGNOSIS — Z1239 Encounter for other screening for malignant neoplasm of breast: Secondary | ICD-10-CM

## 2018-12-10 NOTE — Telephone Encounter (Signed)
Orders placed.

## 2018-12-10 NOTE — Telephone Encounter (Signed)
Message relayed to [facility] Referrals. Verbalized understanding and denied questions.

## 2018-12-10 NOTE — Telephone Encounter (Signed)
Copied from [place] [phone]. Topic: General - Other >> Dec 10, 2018 11:21 AM Parke Poisson wrote: Reason for CRM: Per [facility] they will need an order for bilateral diagnostic mammogram TOMO [id number],limited right breast ultrasound [id number] and limited left breast ultrasound [id number]

## 2019-01-16 ENCOUNTER — Ambulatory Visit (INDEPENDENT_AMBULATORY_CARE_PROVIDER_SITE_OTHER): Payer: BLUE CROSS/BLUE SHIELD | Admitting: Family Medicine

## 2019-01-16 ENCOUNTER — Encounter: Payer: Self-pay | Admitting: Family Medicine

## 2019-01-16 ENCOUNTER — Other Ambulatory Visit: Payer: Self-pay

## 2019-01-16 DIAGNOSIS — R109 Unspecified abdominal pain: Secondary | ICD-10-CM | POA: Diagnosis not present

## 2019-01-16 MED ORDER — CIPROFLOXACIN HCL 500 MG PO TABS: 500.0000 mg | ORAL_TABLET | Freq: Two times a day (BID) | ORAL | 0 refills | Status: DC

## 2019-01-16 MED ORDER — CYCLOBENZAPRINE HCL 10 MG PO TABS: 10.0000 mg | ORAL_TABLET | Freq: Every day | ORAL | 0 refills | Status: DC

## 2019-01-16 NOTE — Progress Notes (Signed)
There were no vitals taken for this visit.   Subjective:    Patient ID: Karen Lambert, female    DOB: 30-Jun-1961, 58 y.o.   MRN: [id number]  HPI: Karen Lambert is a 58 y.o. female  Chief Complaint  Patient presents with  . Back Pain    Lower right side x 3 days   URINARY SYMPTOMS/FLANK PAIN- has been having some pain in her back for the past 3-4 days. Doesn't remember doing anything to hurt her back. Hurting in the lower R side. Has been taking some ibuprofen without benefit. Dull nagging pain. Duration: 3 days Dysuria: no Urinary frequency: yes Urgency: yes Small volume voids: yes Symptom severity: moderate Urinary incontinence: no Foul odor: no Hematuria: no Abdominal pain: no Back pain: yes Suprapubic pain/pressure: no Flank pain: yes Fever:  no Vomiting: no Relief with cranberry juice: no Relief with pyridium: no Status: stable Previous urinary tract infection: yes Recurrent urinary tract infection: no History of sexually transmitted disease: no Vaginal discharge: no Treatments attempted: increasing fluids   Relevant past medical, surgical, family and social history reviewed and updated as indicated. Interim medical history since our last visit reviewed. Allergies and medications reviewed and updated.  Review of Systems  Constitutional: Negative.   HENT: Negative.   Respiratory: Negative.   Cardiovascular: Negative.   Gastrointestinal: Negative.   Genitourinary: Positive for frequency and urgency. Negative for decreased urine volume, difficulty urinating, dyspareunia, dysuria, enuresis, flank pain, genital sores, hematuria, menstrual problem, pelvic pain, vaginal bleeding, vaginal discharge and vaginal pain.  Musculoskeletal: Positive for back pain. Negative for arthralgias, gait problem, joint swelling, myalgias, neck pain and neck stiffness.  Skin: Negative.   Psychiatric/Behavioral: Negative.     Per HPI unless specifically indicated above      Objective:    There were no vitals taken for this visit.  Wt Readings from Last 3 Encounters:  11/13/18 167 lb (75.8 kg)  12/20/15 176 lb (79.8 kg)    Physical Exam Vitals signs and nursing note reviewed.  Constitutional:      General: She is not in acute distress.    Appearance: Normal appearance. She is not ill-appearing, toxic-appearing or diaphoretic.  HENT:     Head: Normocephalic and atraumatic.     Right Ear: External ear normal.     Left Ear: External ear normal.     Nose: Nose normal.     Mouth/Throat:     Mouth: Mucous membranes are moist.     Pharynx: Oropharynx is clear.  Eyes:     General: No scleral icterus.       Right eye: No discharge.        Left eye: No discharge.     Conjunctiva/sclera: Conjunctivae normal.     Pupils: Pupils are equal, round, and reactive to light.  Neck:     Musculoskeletal: Normal range of motion.  Pulmonary:     Effort: Pulmonary effort is normal. No respiratory distress.     Comments: Speaking in full sentences Musculoskeletal: Normal range of motion.  Skin:    Coloration: Skin is not jaundiced or pale.     Findings: No bruising, erythema, lesion or rash.  Neurological:     Mental Status: She is alert and oriented to person, place, and time. Mental status is at baseline.  Psychiatric:        Mood and Affect: Mood normal.        Behavior: Behavior normal.  Thought Content: Thought content normal.        Judgment: Judgment normal.     Results for orders placed or performed in visit on 11/27/18  Microscopic Examination  Result Value Ref Range   WBC, UA 0-5 0 - 5 /hpf   RBC, UA 0-2 0 - 2 /hpf   Epithelial Cells (non renal) 0-10 0 - 10 /hpf   Bacteria, UA Few (A) None seen/Few  Urine Culture, Reflex  Result Value Ref Range   Urine Culture, Routine Final report    Organism ID, Bacteria Comment   Hepatitis C antibody  Result Value Ref Range   Hep C Virus Ab <0.1 0.0 - 0.9 s/co ratio  HIV Antibody (routine testing w  rflx)  Result Value Ref Range   HIV Screen 4th Generation wRfx Non Reactive Non Reactive  UA/M w/rflx Culture, Routine  Result Value Ref Range   Specific Gravity, UA 1.015 1.005 - 1.030   pH, UA 6.5 5.0 - 7.5   Color, UA Yellow Yellow   Appearance Ur Clear Clear   Leukocytes, UA Trace (A) Negative   Protein, UA Negative Negative/Trace   Glucose, UA Negative Negative   Ketones, UA Negative Negative   RBC, UA Trace (A) Negative   Bilirubin, UA Negative Negative   Urobilinogen, Ur 0.2 0.2 - 1.0 mg/dL   Nitrite, UA Negative Negative   Microscopic Examination See below:    Urinalysis Reflex Comment   Lipid Panel w/o Chol/HDL Ratio  Result Value Ref Range   Cholesterol, Total 222 (H) 100 - 199 mg/dL   Triglycerides 216 (H) 0 - 149 mg/dL   HDL 42 >39 mg/dL   VLDL Cholesterol Cal 43 (H) 5 - 40 mg/dL   LDL Calculated 137 (H) 0 - 99 mg/dL  VITAMIN D 25 Hydroxy (Vit-D Deficiency, Fractures)  Result Value Ref Range   Vit D, 25-Hydroxy 17.8 (L) 30.0 - 100.0 ng/mL      Assessment & Plan:   Problem List Items Addressed This Visit    None    Visit Diagnoses    Flank pain    -  Primary   1+ Leuks, will treat with cipro and flexeril in case it's a muscle spasm. Call with any concerns and continue to monitor. F/u 1 wk   Relevant Orders   UA/M w/rflx Culture, Routine       Follow up plan: Return in about 1 week (around 01/23/2019) for follow up flank pain with Jolene.   . This visit was completed via FaceTime due to the restrictions of the COVID-19 pandemic. All issues as above were discussed and addressed. Physical exam was done as above through visual confirmation on FaceTime. If it was felt that the patient should be evaluated in the office, they were directed there. The patient verbally consented to this visit. . Location of the patient: work . Location of the provider: home . Those involved with this call:  . Provider: Park Liter, DO . CMA: Gerda Diss, CMA . Front  Desk/Registration: Don Perking  . Time spent on call: 15 minutes with patient face to face via video conference. More than 50% of this time was spent in counseling and coordination of care. 23 minutes total spent in review of patient's record and preparation of their chart.

## 2019-01-19 LAB — UA/M W/RFLX CULTURE, ROUTINE
Bilirubin, UA: NEGATIVE
Glucose, UA: NEGATIVE
Ketones, UA: NEGATIVE
Nitrite, UA: NEGATIVE
Protein,UA: NEGATIVE
RBC, UA: NEGATIVE
Specific Gravity, UA: 1.025 (ref 1.005–1.030)
Urobilinogen, Ur: 0.2 mg/dL (ref 0.2–1.0)
pH, UA: 5.5 (ref 5.0–7.5)

## 2019-01-19 LAB — URINE CULTURE, REFLEX

## 2019-01-19 LAB — MICROSCOPIC EXAMINATION

## 2019-01-21 ENCOUNTER — Telehealth: Payer: Self-pay | Admitting: Family Medicine

## 2019-01-21 NOTE — Telephone Encounter (Signed)
Left patient a VM letting know what doctor Wynetta Emery said, appt cancelled.

## 2019-01-21 NOTE — Telephone Encounter (Signed)
If she is feeling 100% better, she can cancel that appointment.

## 2019-01-21 NOTE — Telephone Encounter (Signed)
Patient notified of result and states that she is feeling better. Patient wants to know if she needs to keep her appt on Friday with Jolene or not? Please advise.

## 2019-01-21 NOTE — Telephone Encounter (Signed)
Please let her know that her urine bacteria did grow out infection and it should have gotten better with the cipro. If she's not feeling better, let me know. Thanks!

## 2019-01-24 ENCOUNTER — Ambulatory Visit: Payer: BLUE CROSS/BLUE SHIELD | Admitting: Nurse Practitioner

## 2019-01-28 ENCOUNTER — Telehealth: Payer: Self-pay | Admitting: Nurse Practitioner

## 2019-01-28 ENCOUNTER — Other Ambulatory Visit: Payer: Self-pay | Admitting: Nurse Practitioner

## 2019-01-28 MED ORDER — GABAPENTIN 300 MG PO CAPS: 600.0000 mg | ORAL_CAPSULE | Freq: Every day | ORAL | 2 refills | Status: DC

## 2019-01-28 NOTE — Telephone Encounter (Signed)
Complete and I called patient.

## 2019-01-28 NOTE — Telephone Encounter (Signed)
Copied from [place] [phone]. Topic: Quick Communication - Rx Refill/Question >> Jan 28, 2019  3:44 PM Margot Ables wrote: Medication: gabapentin (NEURONTIN) 300 MG capsule - pt has 10 days - please send RX for 30 day supply w/refills  Has the patient contacted their pharmacy? Yes -was told to contact her MD office  Preferred Pharmacy (with phone number or street name): [facility] [place], [place] [phone] (Phone) [phone] (Fax)

## 2019-01-28 NOTE — Addendum Note (Signed)
Addended by: Marnee Guarneri T on: 01/28/2019 04:27 PM   Modules accepted: Orders

## 2019-01-28 NOTE — Progress Notes (Signed)
Spoke to patient via telephone.  She needs refill on gabapentin and reports her current dose as 600 MG at night.

## 2019-02-10 ENCOUNTER — Ambulatory Visit
Admission: RE | Admit: 2019-02-10 | Discharge: 2019-02-10 | Disposition: A | Discharge status: Home / Self Care | Payer: BLUE CROSS/BLUE SHIELD | Source: Ambulatory Visit | Attending: Nurse Practitioner | Admitting: Nurse Practitioner

## 2019-02-10 ENCOUNTER — Other Ambulatory Visit: Payer: Self-pay

## 2019-02-10 DIAGNOSIS — Z1239 Encounter for other screening for malignant neoplasm of breast: Secondary | ICD-10-CM

## 2019-02-24 ENCOUNTER — Telehealth: Payer: Self-pay | Admitting: Nurse Practitioner

## 2019-02-24 ENCOUNTER — Other Ambulatory Visit: Payer: BLUE CROSS/BLUE SHIELD

## 2019-02-24 ENCOUNTER — Other Ambulatory Visit: Payer: Self-pay | Admitting: Nurse Practitioner

## 2019-02-24 ENCOUNTER — Other Ambulatory Visit: Payer: Self-pay

## 2019-02-24 DIAGNOSIS — E559 Vitamin D deficiency, unspecified: Secondary | ICD-10-CM

## 2019-02-24 DIAGNOSIS — Z1283 Encounter for screening for malignant neoplasm of skin: Secondary | ICD-10-CM

## 2019-02-24 NOTE — Telephone Encounter (Signed)
Placed referral for patient

## 2019-02-24 NOTE — Telephone Encounter (Signed)
Patient is requesting a referral to a dermatologist for basal cell carcinoma. Patient states that she was seeing a provider at [facility] but can no longer see the provider due to insurance. Patient would like to see someone within our network. Please advise.  Thanks

## 2019-02-24 NOTE — Progress Notes (Signed)
Current dermatologist out of network.  Would like referral to new dermatologist.

## 2019-02-25 LAB — VITAMIN D 25 HYDROXY (VIT D DEFICIENCY, FRACTURES): Vit D, 25-Hydroxy: 29.1 ng/mL — ABNORMAL LOW (ref 30.0–100.0)

## 2019-05-30 ENCOUNTER — Other Ambulatory Visit: Payer: Self-pay

## 2019-05-30 ENCOUNTER — Ambulatory Visit (INDEPENDENT_AMBULATORY_CARE_PROVIDER_SITE_OTHER): Payer: BLUE CROSS/BLUE SHIELD | Admitting: Nurse Practitioner

## 2019-05-30 ENCOUNTER — Encounter: Payer: Self-pay | Admitting: Nurse Practitioner

## 2019-05-30 VITALS — BP 132/51 | HR 70 | Temp 98.7°F | Ht 60.0 in

## 2019-05-30 DIAGNOSIS — E559 Vitamin D deficiency, unspecified: Secondary | ICD-10-CM | POA: Diagnosis not present

## 2019-05-30 DIAGNOSIS — Z23 Encounter for immunization: Secondary | ICD-10-CM

## 2019-05-30 DIAGNOSIS — N951 Menopausal and female climacteric states: Secondary | ICD-10-CM | POA: Diagnosis not present

## 2019-05-30 DIAGNOSIS — E7800 Pure hypercholesterolemia, unspecified: Secondary | ICD-10-CM | POA: Diagnosis not present

## 2019-05-30 MED ORDER — CITALOPRAM HYDROBROMIDE 10 MG PO TABS: ORAL_TABLET | ORAL | 3 refills | Status: DC

## 2019-05-30 NOTE — Progress Notes (Signed)
BP (!) 132/51   Pulse 70   Temp 98.7 F (37.1 C) (Oral)   Ht 5' (1.524 m)   SpO2 97%   BMI 32.61 kg/m    Subjective:    Patient ID: Karen Lambert, female    DOB: 1961-03-09, 58 y.o.   MRN: [id number]  HPI: Karen Lambert is a 58 y.o. female  Chief Complaint  Patient presents with  . Hyperlipidemia    72m f/u   HYPERLIPIDEMIA No current medications. Hyperlipidemia status: good compliance Supplements: none Aspirin:  no The 10-year ASCVD risk score Mikey Bussing DC Jr., et al., 2013) is: 3.6%   Values used to calculate the score:     Age: 33 years     Sex: Female     Is Non-Hispanic African American: No     Diabetic: No     Tobacco smoker: No     Systolic Blood Pressure: [id number] mmHg     Is BP treated: No     HDL Cholesterol: 42 mg/dL     Total Cholesterol: 222 mg/dL Chest pain:  no Coronary artery disease:  no Family history CAD:  no Family history early CAD:  no   MENOPAUSAL SYMPTOMS Recent increase in hot flashes and sweats.  Used to be only at night, but now during daytime at times too.  Currently takes Gabapentin for postherpetic neuralgia. Duration: uncontrolled Symptom severity: moderate Hot flashes: yes Night sweats: yes Sleep disturbances: yes Vaginal dryness: no Dyspareunia:no Decreased libido: no Emotional lability: no Stress incontinence: no Previous HRT/pharmacotherapy: no Hysterectomy: yes Absolute Contraindications to Hormonal Therapy:     Undiagnosed vaginal bleeding: no    Breast cancer: no    Endometrial cancer: no    Coronary disease: no    Cerebrovascular disease: no    Venous thromboembolic disease: no   VITAMIN D DEFICIENCY: Continues on daily supplement.  Recent ankle fracture present after fall, but no other recent fractures or falls.  Reports good tolerance of daily supplement.  Relevant past medical, surgical, family and social history reviewed and updated as indicated. Interim medical history since our last visit reviewed. Allergies and  medications reviewed and updated.  Review of Systems  Constitutional: Negative for activity change, appetite change, diaphoresis, fatigue and fever.  Respiratory: Negative for cough, chest tightness and shortness of breath.   Cardiovascular: Negative for chest pain, palpitations and leg swelling.  Gastrointestinal: Negative for abdominal distention, abdominal pain, constipation, diarrhea, nausea and vomiting.  Neurological: Negative for dizziness, syncope, weakness, light-headedness, numbness and headaches.  Psychiatric/Behavioral: Negative.     Per HPI unless specifically indicated above     Objective:    BP (!) 132/51   Pulse 70   Temp 98.7 F (37.1 C) (Oral)   Ht 5' (1.524 m)   SpO2 97%   BMI 32.61 kg/m   Wt Readings from Last 3 Encounters:  11/13/18 167 lb (75.8 kg)  12/20/15 176 lb (79.8 kg)    Physical Exam Vitals signs and nursing note reviewed.  Constitutional:      General: She is awake. She is not in acute distress.    Appearance: She is well-developed. She is obese. She is not ill-appearing.  HENT:     Head: Normocephalic.     Right Ear: Hearing normal.     Left Ear: Hearing normal.  Eyes:     General: Lids are normal.        Right eye: No discharge.        Left eye:  No discharge.     Conjunctiva/sclera: Conjunctivae normal.     Pupils: Pupils are equal, round, and reactive to light.  Neck:     Musculoskeletal: Normal range of motion and neck supple.     Vascular: No carotid bruit.  Cardiovascular:     Rate and Rhythm: Normal rate and regular rhythm.     Heart sounds: Normal heart sounds. No murmur. No gallop.   Pulmonary:     Effort: Pulmonary effort is normal. No accessory muscle usage or respiratory distress.     Breath sounds: Normal breath sounds.  Abdominal:     General: Bowel sounds are normal.     Palpations: Abdomen is soft.  Musculoskeletal:     Right lower leg: No edema.     Left lower leg: No edema.  Lymphadenopathy:     Cervical: No  cervical adenopathy.  Skin:    General: Skin is warm and dry.  Neurological:     Mental Status: She is alert and oriented to person, place, and time.  Psychiatric:        Attention and Perception: Attention normal.        Mood and Affect: Mood normal.        Behavior: Behavior normal. Behavior is cooperative.        Thought Content: Thought content normal.        Judgment: Judgment normal.     Results for orders placed or performed in visit on 02/24/19  VITAMIN D 25 Hydroxy (Vit-D Deficiency, Fractures)  Result Value Ref Range   Vit D, 25-Hydroxy 29.1 (L) 30.0 - 100.0 ng/mL      Assessment & Plan:   Problem List Items Addressed This Visit      Other   Pure hypercholesterolemia - Primary    Chronic, ongoing.  ASCVD last 3%.  Repeat lipid panel future labs and focus on diet.      Relevant Orders   Lipid Panel w/o Chol/HDL Ratio   Vitamin D deficiency    Chronic, ongoing.  Continue current medication regimen.  Vitamin D level future labs.      Relevant Orders   VITAMIN D 25 Hydroxy (Vit-D Deficiency, Fractures)   Hot flashes due to menopause    With recent increase in hot flashes and sweats.  Is on Gabapentin at HS.  Will trial switching Sertraline to Citalopram daily, start at 10 MG and work up to 20 MG in one week.  Script sent.  Discussed simple measures to take at home to help with hot flashes.  Return in 4 weeks.       Other Visit Diagnoses    Flu vaccine need       Relevant Orders   Flu Vaccine QUAD 36+ mos IM (Completed)       Follow up plan: Return in about 4 weeks (around 06/27/2019) for Menopause.

## 2019-05-30 NOTE — Assessment & Plan Note (Signed)
Chronic, ongoing.  Continue current medication regimen.  Vitamin D level future labs.

## 2019-05-30 NOTE — Assessment & Plan Note (Signed)
With recent increase in hot flashes and sweats.  Is on Gabapentin at HS.  Will trial switching Sertraline to Citalopram daily, start at 10 MG and work up to 20 MG in one week.  Script sent.  Discussed simple measures to take at home to help with hot flashes.  Return in 4 weeks.

## 2019-05-30 NOTE — Patient Instructions (Signed)
Stop taking Sertraline (Zoloft) and start taking Citalopram (Celexa). Black cohosh menopause and red yeast rice (cholsterol)   Menopause Menopause is the normal time of life when menstrual periods stop completely. It is usually confirmed by 12 months without a menstrual period. The transition to menopause (perimenopause) most often happens between the ages of 64 and 20. During perimenopause, hormone levels change in your body, which can cause symptoms and affect your health. Menopause may increase your risk for:  Loss of bone (osteoporosis), which causes bone breaks (fractures).  Depression.  Hardening and narrowing of the arteries (atherosclerosis), which can cause heart attacks and strokes. What are the causes? This condition is usually caused by a natural change in hormone levels that happens as you get older. The condition may also be caused by surgery to remove both ovaries (bilateral oophorectomy). What increases the risk? This condition is more likely to start at an earlier age if you have certain medical conditions or treatments, including:  A tumor of the pituitary gland in the brain.  A disease that affects the ovaries and hormone production.  Radiation treatment for cancer.  Certain cancer treatments, such as chemotherapy or hormone (anti-estrogen) therapy.  Heavy smoking and excessive alcohol use.  Family history of early menopause. This condition is also more likely to develop earlier in women who are very thin. What are the signs or symptoms? Symptoms of this condition include:  Hot flashes.  Irregular menstrual periods.  Night sweats.  Changes in feelings about sex. This could be a decrease in sex drive or an increased comfort around your sexuality.  Vaginal dryness and thinning of the vaginal walls. This may cause painful intercourse.  Dryness of the skin and development of wrinkles.  Headaches.  Problems sleeping (insomnia).  Mood swings or  irritability.  Memory problems.  Weight gain.  Hair growth on the face and chest.  Bladder infections or problems with urinating. How is this diagnosed? This condition is diagnosed based on your medical history, a physical exam, your age, your menstrual history, and your symptoms. Hormone tests may also be done. How is this treated? In some cases, no treatment is needed. You and your health care provider should make a decision together about whether treatment is necessary. Treatment will be based on your individual condition and preferences. Treatment for this condition focuses on managing symptoms. Treatment may include:  Menopausal hormone therapy (MHT).  Medicines to treat specific symptoms or complications.  Acupuncture.  Vitamin or herbal supplements. Before starting treatment, make sure to let your health care provider know if you have a personal or family history of:  Heart disease.  Breast cancer.  Blood clots.  Diabetes.  Osteoporosis. Follow these instructions at home: Lifestyle  Do not use any products that contain nicotine or tobacco, such as cigarettes and e-cigarettes. If you need help quitting, ask your health care provider.  Get at least 30 minutes of physical activity on 5 or more days each week.  Avoid alcoholic and caffeinated beverages, as well as spicy foods. This may help prevent hot flashes.  Get 7-8 hours of sleep each night.  If you have hot flashes, try: ? Dressing in layers. ? Avoiding things that may trigger hot flashes, such as spicy food, warm places, or stress. ? Taking slow, deep breaths when a hot flash starts. ? Keeping a fan in your home and office.  Find ways to manage stress, such as deep breathing, meditation, or journaling.  Consider going to group therapy with  other women who are having menopause symptoms. Ask your health care provider about recommended group therapy meetings. Eating and drinking  Eat a healthy, balanced  diet that contains whole grains, lean protein, low-fat dairy, and plenty of fruits and vegetables.  Your health care provider may recommend adding more soy to your diet. Foods that contain soy include tofu, tempeh, and soy milk.  Eat plenty of foods that contain calcium and vitamin D for bone health. Items that are rich in calcium include low-fat milk, yogurt, beans, almonds, sardines, broccoli, and kale. Medicines  Take over-the-counter and prescription medicines only as told by your health care provider.  Talk with your health care provider before starting any herbal supplements. If prescribed, take vitamins and supplements as told by your health care provider. These may include: ? Calcium. Women age 76 and older should get 1,200 mg (milligrams) of calcium every day. ? Vitamin D. Women need 600-800 International Units of vitamin D each day. ? Vitamins B12 and B6. Aim for 50 micrograms of B12 and 1.5 mg of B6 each day. General instructions  Keep track of your menstrual periods, including: ? When they occur. ? How heavy they are and how long they last. ? How much time passes between periods.  Keep track of your symptoms, noting when they start, how often you have them, and how long they last.  Use vaginal lubricants or moisturizers to help with vaginal dryness and improve comfort during sex.  Keep all follow-up visits as told by your health care provider. This is important. This includes any group therapy or counseling. Contact a health care provider if:  You are still having menstrual periods after age 54.  You have pain during sex.  You have not had a period for 12 months and you develop vaginal bleeding. Get help right away if:  You have: ? Severe depression. ? Excessive vaginal bleeding. ? Pain when you urinate. ? A fast or irregular heart beat (palpitations). ? Severe headaches. ? Abdomen (abdominal) pain or severe indigestion.  You fell and you think you have a broken  bone.  You develop leg or chest pain.  You develop vision problems.  You feel a lump in your breast. Summary  Menopause is the normal time of life when menstrual periods stop completely. It is usually confirmed by 12 months without a menstrual period.  The transition to menopause (perimenopause) most often happens between the ages of 63 and 73.  Symptoms can be managed through medicines, lifestyle changes, and complementary therapies such as acupuncture.  Eat a balanced diet that is rich in nutrients to promote bone health and heart health and to manage symptoms during menopause. This information is not intended to replace advice given to you by your health care provider. Make sure you discuss any questions you have with your health care provider. Document Released: 11/18/2003 Document Revised: 08/10/2017 Document Reviewed: 09/30/2016 Elsevier Patient Education  2020 [employer].

## 2019-05-30 NOTE — Assessment & Plan Note (Signed)
Chronic, ongoing.  ASCVD last 3%.  Repeat lipid panel future labs and focus on diet.

## 2019-06-04 ENCOUNTER — Other Ambulatory Visit: Payer: Self-pay

## 2019-06-04 ENCOUNTER — Ambulatory Visit: Payer: BLUE CROSS/BLUE SHIELD

## 2019-06-04 ENCOUNTER — Other Ambulatory Visit: Payer: BLUE CROSS/BLUE SHIELD

## 2019-06-04 DIAGNOSIS — E559 Vitamin D deficiency, unspecified: Secondary | ICD-10-CM

## 2019-06-04 DIAGNOSIS — E7800 Pure hypercholesterolemia, unspecified: Secondary | ICD-10-CM

## 2019-06-05 LAB — VITAMIN D 25 HYDROXY (VIT D DEFICIENCY, FRACTURES): Vit D, 25-Hydroxy: 21.3 ng/mL — ABNORMAL LOW (ref 30.0–100.0)

## 2019-06-05 LAB — LIPID PANEL W/O CHOL/HDL RATIO
Cholesterol, Total: 285 mg/dL — ABNORMAL HIGH (ref 100–199)
HDL: 45 mg/dL (ref >39)
LDL Chol Calc (NIH): 166 mg/dL — ABNORMAL HIGH (ref 0–99)
Triglycerides: 387 mg/dL — ABNORMAL HIGH (ref 0–149)
VLDL Cholesterol Cal: 74 mg/dL — ABNORMAL HIGH (ref 5–40)

## 2019-06-12 ENCOUNTER — Other Ambulatory Visit: Payer: Self-pay | Admitting: Nurse Practitioner

## 2019-06-12 MED ORDER — OXYBUTYNIN CHLORIDE ER 5 MG PO TB24: 5.0000 mg | ORAL_TABLET | Freq: Every day | ORAL | 2 refills | Status: DC

## 2019-06-12 NOTE — Progress Notes (Signed)
Patient with overactive bladder, will write for script for Oxybutynin ER.  WAs previously on Tolterodine 2 MG daily, ordered by previous provider.  Discussed at length with her risk for interaction with other medications and other risks with this medication.  Discussed Myrbetriq, she is going to look into insurance coverage for this as would be preferred choice due to less interactions and safer with aging.  At this time will trial Oxybutynin.  She is aware to ensure to drink plenty of fluid and notify provider if issues.

## 2019-06-27 ENCOUNTER — Ambulatory Visit (INDEPENDENT_AMBULATORY_CARE_PROVIDER_SITE_OTHER): Payer: BLUE CROSS/BLUE SHIELD | Admitting: Nurse Practitioner

## 2019-06-27 ENCOUNTER — Other Ambulatory Visit: Payer: Self-pay

## 2019-06-27 ENCOUNTER — Encounter: Payer: Self-pay | Admitting: Nurse Practitioner

## 2019-06-27 DIAGNOSIS — N951 Menopausal and female climacteric states: Secondary | ICD-10-CM

## 2019-06-27 MED ORDER — GABAPENTIN 300 MG PO CAPS: ORAL_CAPSULE | ORAL | 3 refills | Status: DC

## 2019-06-27 NOTE — Assessment & Plan Note (Signed)
Ongoing, minimal improvement.  Will add on 300 MG Gabapentin at 5 PM and continue 600 MG at bedtime + patient is to start taking Celexa 20 MG.  Continue Melatonin for sleep.  Plan on follow-up in 4 weeks.

## 2019-06-27 NOTE — Patient Instructions (Signed)
Menopause Menopause is the normal time of life when menstrual periods stop completely. It is usually confirmed by 12 months without a menstrual period. The transition to menopause (perimenopause) most often happens between the ages of 45 and 55. During perimenopause, hormone levels change in your body, which can cause symptoms and affect your health. Menopause may increase your risk for:  Loss of bone (osteoporosis), which causes bone breaks (fractures).  Depression.  Hardening and narrowing of the arteries (atherosclerosis), which can cause heart attacks and strokes. What are the causes? This condition is usually caused by a natural change in hormone levels that happens as you get older. The condition may also be caused by surgery to remove both ovaries (bilateral oophorectomy). What increases the risk? This condition is more likely to start at an earlier age if you have certain medical conditions or treatments, including:  A tumor of the pituitary gland in the brain.  A disease that affects the ovaries and hormone production.  Radiation treatment for cancer.  Certain cancer treatments, such as chemotherapy or hormone (anti-estrogen) therapy.  Heavy smoking and excessive alcohol use.  Family history of early menopause. This condition is also more likely to develop earlier in women who are very thin. What are the signs or symptoms? Symptoms of this condition include:  Hot flashes.  Irregular menstrual periods.  Night sweats.  Changes in feelings about sex. This could be a decrease in sex drive or an increased comfort around your sexuality.  Vaginal dryness and thinning of the vaginal walls. This may cause painful intercourse.  Dryness of the skin and development of wrinkles.  Headaches.  Problems sleeping (insomnia).  Mood swings or irritability.  Memory problems.  Weight gain.  Hair growth on the face and chest.  Bladder infections or problems with urinating. How  is this diagnosed? This condition is diagnosed based on your medical history, a physical exam, your age, your menstrual history, and your symptoms. Hormone tests may also be done. How is this treated? In some cases, no treatment is needed. You and your health care provider should make a decision together about whether treatment is necessary. Treatment will be based on your individual condition and preferences. Treatment for this condition focuses on managing symptoms. Treatment may include:  Menopausal hormone therapy (MHT).  Medicines to treat specific symptoms or complications.  Acupuncture.  Vitamin or herbal supplements. Before starting treatment, make sure to let your health care provider know if you have a personal or family history of:  Heart disease.  Breast cancer.  Blood clots.  Diabetes.  Osteoporosis. Follow these instructions at home: Lifestyle  Do not use any products that contain nicotine or tobacco, such as cigarettes and e-cigarettes. If you need help quitting, ask your health care provider.  Get at least 30 minutes of physical activity on 5 or more days each week.  Avoid alcoholic and caffeinated beverages, as well as spicy foods. This may help prevent hot flashes.  Get 7-8 hours of sleep each night.  If you have hot flashes, try: ? Dressing in layers. ? Avoiding things that may trigger hot flashes, such as spicy food, warm places, or stress. ? Taking slow, deep breaths when a hot flash starts. ? Keeping a fan in your home and office.  Find ways to manage stress, such as deep breathing, meditation, or journaling.  Consider going to group therapy with other women who are having menopause symptoms. Ask your health care provider about recommended group therapy meetings. Eating and   drinking  Eat a healthy, balanced diet that contains whole grains, lean protein, low-fat dairy, and plenty of fruits and vegetables.  Your health care provider may recommend  adding more soy to your diet. Foods that contain soy include tofu, tempeh, and soy milk.  Eat plenty of foods that contain calcium and vitamin D for bone health. Items that are rich in calcium include low-fat milk, yogurt, beans, almonds, sardines, broccoli, and kale. Medicines  Take over-the-counter and prescription medicines only as told by your health care provider.  Talk with your health care provider before starting any herbal supplements. If prescribed, take vitamins and supplements as told by your health care provider. These may include: ? Calcium. Women age 51 and older should get 1,200 mg (milligrams) of calcium every day. ? Vitamin D. Women need 600-800 International Units of vitamin D each day. ? Vitamins B12 and B6. Aim for 50 micrograms of B12 and 1.5 mg of B6 each day. General instructions  Keep track of your menstrual periods, including: ? When they occur. ? How heavy they are and how long they last. ? How much time passes between periods.  Keep track of your symptoms, noting when they start, how often you have them, and how long they last.  Use vaginal lubricants or moisturizers to help with vaginal dryness and improve comfort during sex.  Keep all follow-up visits as told by your health care provider. This is important. This includes any group therapy or counseling. Contact a health care provider if:  You are still having menstrual periods after age 55.  You have pain during sex.  You have not had a period for 12 months and you develop vaginal bleeding. Get help right away if:  You have: ? Severe depression. ? Excessive vaginal bleeding. ? Pain when you urinate. ? A fast or irregular heart beat (palpitations). ? Severe headaches. ? Abdomen (abdominal) pain or severe indigestion.  You fell and you think you have a broken bone.  You develop leg or chest pain.  You develop vision problems.  You feel a lump in your breast. Summary  Menopause is the normal  time of life when menstrual periods stop completely. It is usually confirmed by 12 months without a menstrual period.  The transition to menopause (perimenopause) most often happens between the ages of 45 and 55.  Symptoms can be managed through medicines, lifestyle changes, and complementary therapies such as acupuncture.  Eat a balanced diet that is rich in nutrients to promote bone health and heart health and to manage symptoms during menopause. This information is not intended to replace advice given to you by your health care provider. Make sure you discuss any questions you have with your health care provider. Document Released: 11/18/2003 Document Revised: 08/10/2017 Document Reviewed: 09/30/2016 Elsevier Patient Education  2020 [employer]  

## 2019-06-27 NOTE — Progress Notes (Signed)
BP (!) 143/83 (BP Location: Left Arm, Patient Position: Sitting)   Pulse 83   Temp 98.4 F (36.9 C) (Oral)   SpO2 97%    Subjective:    Patient ID: Karen Lambert, female    DOB: 1961/02/21, 58 y.o.   MRN: [id number]  HPI: Karen Lambert is a 58 y.o. female  Chief Complaint  Patient presents with  . Menopause   MENOPAUSAL SYMPTOMS Last visit Sertraline was changed to Citalopram to assist with hot flashes.  Does endorse some difficulty sleeping, but this is often helped with Melatonin. Does take Gabapentin 600 MG at bedtime.  Has not worked up to [place], is taking 10 MG. Gravida/Para: G1P1 Duration: stable Symptom severity: mild Hot flashes: yes Night sweats: yes Sleep disturbances: yes Vaginal dryness: no Dyspareunia:no Decreased libido: no Emotional lability: no Stress incontinence: no Previous HRT/pharmacotherapy: no Hysterectomy: yes Average interval between menses:  Length of menses:  Flow: Dysmenorrhea:  GYN surgery:  Absolute Contraindications to Hormonal Therapy:     Undiagnosed vaginal bleeding: no    Breast cancer: no    Endometrial cancer: no    Coronary disease: no    Cerebrovascular disease: no    Venous thromboembolic disease: no  Relevant past medical, surgical, family and social history reviewed and updated as indicated. Interim medical history since our last visit reviewed. Allergies and medications reviewed and updated.  Review of Systems  Constitutional: Negative for activity change, appetite change, diaphoresis, fatigue and fever.  Respiratory: Negative for cough, chest tightness and shortness of breath.   Cardiovascular: Negative for chest pain, palpitations and leg swelling.  Gastrointestinal: Negative for abdominal distention, abdominal pain, constipation, diarrhea, nausea and vomiting.  Neurological: Negative for dizziness, syncope, weakness, light-headedness, numbness and headaches.  Psychiatric/Behavioral: Negative.     Per HPI  unless specifically indicated above     Objective:    BP (!) 143/83 (BP Location: Left Arm, Patient Position: Sitting)   Pulse 83   Temp 98.4 F (36.9 C) (Oral)   SpO2 97%   Wt Readings from Last 3 Encounters:  11/13/18 167 lb (75.8 kg)  12/20/15 176 lb (79.8 kg)    Physical Exam Vitals signs and nursing note reviewed.  Constitutional:      General: She is awake. She is not in acute distress.    Appearance: She is well-developed. She is not ill-appearing.  HENT:     Head: Normocephalic.     Right Ear: Hearing normal.     Left Ear: Hearing normal.  Eyes:     General: Lids are normal.        Right eye: No discharge.        Left eye: No discharge.     Conjunctiva/sclera: Conjunctivae normal.     Pupils: Pupils are equal, round, and reactive to light.  Neck:     Musculoskeletal: Normal range of motion and neck supple.     Vascular: No carotid bruit.  Cardiovascular:     Rate and Rhythm: Normal rate and regular rhythm.     Heart sounds: Normal heart sounds. No murmur. No gallop.   Pulmonary:     Effort: Pulmonary effort is normal. No accessory muscle usage or respiratory distress.     Breath sounds: Normal breath sounds.  Abdominal:     General: Bowel sounds are normal.     Palpations: Abdomen is soft.  Musculoskeletal:     Right lower leg: No edema.     Left lower leg:  No edema.  Skin:    General: Skin is warm and dry.  Neurological:     Mental Status: She is alert and oriented to person, place, and time.  Psychiatric:        Attention and Perception: Attention normal.        Mood and Affect: Mood normal.        Behavior: Behavior normal. Behavior is cooperative.        Thought Content: Thought content normal.        Judgment: Judgment normal.     Results for orders placed or performed in visit on 06/04/19  VITAMIN D 25 Hydroxy (Vit-D Deficiency, Fractures)  Result Value Ref Range   Vit D, 25-Hydroxy 21.3 (L) 30.0 - 100.0 ng/mL  Lipid Panel w/o Chol/HDL Ratio   Result Value Ref Range   Cholesterol, Total 285 (H) 100 - 199 mg/dL   Triglycerides 387 (H) 0 - 149 mg/dL   HDL 45 >39 mg/dL   VLDL Cholesterol Cal 74 (H) 5 - 40 mg/dL   LDL Chol Calc (NIH) 166 (H) 0 - 99 mg/dL      Assessment & Plan:   Problem List Items Addressed This Visit      Other   Hot flashes due to menopause    Ongoing, minimal improvement.  Will add on 300 MG Gabapentin at 5 PM and continue 600 MG at bedtime + patient is to start taking Celexa 20 MG.  Continue Melatonin for sleep.  Plan on follow-up in 4 weeks.          Follow up plan: Return in about 4 weeks (around 07/25/2019) for Menopause.

## 2019-07-25 ENCOUNTER — Encounter: Payer: Self-pay | Admitting: Nurse Practitioner

## 2019-07-25 ENCOUNTER — Other Ambulatory Visit: Payer: Self-pay

## 2019-07-25 ENCOUNTER — Ambulatory Visit (INDEPENDENT_AMBULATORY_CARE_PROVIDER_SITE_OTHER): Payer: BLUE CROSS/BLUE SHIELD | Admitting: Nurse Practitioner

## 2019-07-25 VITALS — BP 116/73 | HR 64 | Temp 98.2°F | Ht 60.0 in | Wt 159.0 lb

## 2019-07-25 DIAGNOSIS — N951 Menopausal and female climacteric states: Secondary | ICD-10-CM | POA: Diagnosis not present

## 2019-07-25 DIAGNOSIS — E559 Vitamin D deficiency, unspecified: Secondary | ICD-10-CM | POA: Diagnosis not present

## 2019-07-25 NOTE — Assessment & Plan Note (Signed)
Chronic, ongoing.  Continue supplement and recheck Vitamin D level today.

## 2019-07-25 NOTE — Assessment & Plan Note (Signed)
Ongoing, with improvement.  Continue Celexa and Gabapentin.  Continue Melatonin for sleep.  Plan on follow-up in 5 months or sooner if worsening symptoms present,

## 2019-07-25 NOTE — Progress Notes (Signed)
BP 116/73   Pulse 64   Temp 98.2 F (36.8 C) (Oral)   Ht 5' (1.524 m)   Wt 159 lb (72.1 kg)   SpO2 97%   BMI 31.05 kg/m    Subjective:    Patient ID: Karen Lambert, female    DOB: 1961/06/18, 58 y.o.   MRN: [id number]  HPI: [name] is a 58 y.o. female  Chief Complaint  Patient presents with  . Hot Flashes    f/u   MENOPAUSAL SYMPTOMS Increased Gabapentin last visit, which she reports is helping with symptoms. Duration: stable Symptom severity: mild Hot flashes: occasional, improved Night sweats: occasional, improved Sleep disturbances: no Vaginal dryness: no Dyspareunia:no Decreased libido: no Emotional lability: no Stress incontinence: no Previous HRT/pharmacotherapy: no Hysterectomy: yes Average interval between menses:  Length of menses:  Flow: Dysmenorrhea:  GYN surgery:  Absolute Contraindications to Hormonal Therapy:     Undiagnosed vaginal bleeding: no    Breast cancer: no    Endometrial cancer: no    Coronary disease: no    Cerebrovascular disease: no    Venous thromboembolic disease: no  VITAMIN D DEFICIENCY: Taking Vitamin D 1000 units daily.  Did have one recent fall, followed by ortho after right ankle fracture.    Relevant past medical, surgical, family and social history reviewed and updated as indicated. Interim medical history since our last visit reviewed. Allergies and medications reviewed and updated.  Review of Systems  Constitutional: Negative for activity change, appetite change, diaphoresis, fatigue and fever.  Respiratory: Negative for cough, chest tightness and shortness of breath.   Cardiovascular: Negative for chest pain, palpitations and leg swelling.  Gastrointestinal: Negative for abdominal distention, abdominal pain, constipation, diarrhea, nausea and vomiting.  Psychiatric/Behavioral: Negative.     Per HPI unless specifically indicated above     Objective:    BP 116/73   Pulse 64   Temp 98.2 F (36.8 C)  (Oral)   Ht 5' (1.524 m)   Wt 159 lb (72.1 kg)   SpO2 97%   BMI 31.05 kg/m   Wt Readings from Last 3 Encounters:  07/25/19 159 lb (72.1 kg)  11/13/18 167 lb (75.8 kg)  12/20/15 176 lb (79.8 kg)    Physical Exam Vitals signs and nursing note reviewed.  Constitutional:      General: She is awake. She is not in acute distress.    Appearance: She is well-developed. She is not ill-appearing.  HENT:     Head: Normocephalic.     Right Ear: Hearing normal.     Left Ear: Hearing normal.  Eyes:     General: Lids are normal.        Right eye: No discharge.        Left eye: No discharge.     Conjunctiva/sclera: Conjunctivae normal.     Pupils: Pupils are equal, round, and reactive to light.  Neck:     Musculoskeletal: Normal range of motion and neck supple.     Vascular: No carotid bruit.  Cardiovascular:     Rate and Rhythm: Normal rate and regular rhythm.     Heart sounds: Normal heart sounds. No murmur. No gallop.   Pulmonary:     Effort: Pulmonary effort is normal. No accessory muscle usage or respiratory distress.     Breath sounds: Normal breath sounds.  Abdominal:     General: Bowel sounds are normal.     Palpations: Abdomen is soft.  Musculoskeletal:     Right  lower leg: No edema.     Left lower leg: No edema.  Skin:    General: Skin is warm and dry.  Neurological:     Mental Status: She is alert and oriented to person, place, and time.  Psychiatric:        Attention and Perception: Attention normal.        Mood and Affect: Mood normal.        Behavior: Behavior normal. Behavior is cooperative.        Thought Content: Thought content normal.        Judgment: Judgment normal.     Results for orders placed or performed in visit on 06/04/19  VITAMIN D 25 Hydroxy (Vit-D Deficiency, Fractures)  Result Value Ref Range   Vit D, 25-Hydroxy 21.3 (L) 30.0 - 100.0 ng/mL  Lipid Panel w/o Chol/HDL Ratio  Result Value Ref Range   Cholesterol, Total 285 (H) 100 - 199 mg/dL    Triglycerides 387 (H) 0 - 149 mg/dL   HDL 45 >39 mg/dL   VLDL Cholesterol Cal 74 (H) 5 - 40 mg/dL   LDL Chol Calc (NIH) 166 (H) 0 - 99 mg/dL      Assessment & Plan:   Problem List Items Addressed This Visit      Other   Vitamin D deficiency - Primary    Chronic, ongoing.  Continue supplement and recheck Vitamin D level today.      Relevant Orders   Vit D  25 hydroxy (rtn osteoporosis monitoring)   Hot flashes due to menopause    Ongoing, with improvement.  Continue Celexa and Gabapentin.  Continue Melatonin for sleep.  Plan on follow-up in 5 months or sooner if worsening symptoms present,          Follow up plan: Return in about 5 months (around 12/23/2019) for HLD and Overactive Bladder + Vit D deficiency.

## 2019-07-25 NOTE — Patient Instructions (Signed)
Menopause Menopause is the normal time of life when menstrual periods stop completely. It is usually confirmed by 12 months without a menstrual period. The transition to menopause (perimenopause) most often happens between the ages of 45 and 55. During perimenopause, hormone levels change in your body, which can cause symptoms and affect your health. Menopause may increase your risk for:  Loss of bone (osteoporosis), which causes bone breaks (fractures).  Depression.  Hardening and narrowing of the arteries (atherosclerosis), which can cause heart attacks and strokes. What are the causes? This condition is usually caused by a natural change in hormone levels that happens as you get older. The condition may also be caused by surgery to remove both ovaries (bilateral oophorectomy). What increases the risk? This condition is more likely to start at an earlier age if you have certain medical conditions or treatments, including:  A tumor of the pituitary gland in the brain.  A disease that affects the ovaries and hormone production.  Radiation treatment for cancer.  Certain cancer treatments, such as chemotherapy or hormone (anti-estrogen) therapy.  Heavy smoking and excessive alcohol use.  Family history of early menopause. This condition is also more likely to develop earlier in women who are very thin. What are the signs or symptoms? Symptoms of this condition include:  Hot flashes.  Irregular menstrual periods.  Night sweats.  Changes in feelings about sex. This could be a decrease in sex drive or an increased comfort around your sexuality.  Vaginal dryness and thinning of the vaginal walls. This may cause painful intercourse.  Dryness of the skin and development of wrinkles.  Headaches.  Problems sleeping (insomnia).  Mood swings or irritability.  Memory problems.  Weight gain.  Hair growth on the face and chest.  Bladder infections or problems with urinating. How  is this diagnosed? This condition is diagnosed based on your medical history, a physical exam, your age, your menstrual history, and your symptoms. Hormone tests may also be done. How is this treated? In some cases, no treatment is needed. You and your health care provider should make a decision together about whether treatment is necessary. Treatment will be based on your individual condition and preferences. Treatment for this condition focuses on managing symptoms. Treatment may include:  Menopausal hormone therapy (MHT).  Medicines to treat specific symptoms or complications.  Acupuncture.  Vitamin or herbal supplements. Before starting treatment, make sure to let your health care provider know if you have a personal or family history of:  Heart disease.  Breast cancer.  Blood clots.  Diabetes.  Osteoporosis. Follow these instructions at home: Lifestyle  Do not use any products that contain nicotine or tobacco, such as cigarettes and e-cigarettes. If you need help quitting, ask your health care provider.  Get at least 30 minutes of physical activity on 5 or more days each week.  Avoid alcoholic and caffeinated beverages, as well as spicy foods. This may help prevent hot flashes.  Get 7-8 hours of sleep each night.  If you have hot flashes, try: ? Dressing in layers. ? Avoiding things that may trigger hot flashes, such as spicy food, warm places, or stress. ? Taking slow, deep breaths when a hot flash starts. ? Keeping a fan in your home and office.  Find ways to manage stress, such as deep breathing, meditation, or journaling.  Consider going to group therapy with other women who are having menopause symptoms. Ask your health care provider about recommended group therapy meetings. Eating and   drinking  Eat a healthy, balanced diet that contains whole grains, lean protein, low-fat dairy, and plenty of fruits and vegetables.  Your health care provider may recommend  adding more soy to your diet. Foods that contain soy include tofu, tempeh, and soy milk.  Eat plenty of foods that contain calcium and vitamin D for bone health. Items that are rich in calcium include low-fat milk, yogurt, beans, almonds, sardines, broccoli, and kale. Medicines  Take over-the-counter and prescription medicines only as told by your health care provider.  Talk with your health care provider before starting any herbal supplements. If prescribed, take vitamins and supplements as told by your health care provider. These may include: ? Calcium. Women age 51 and older should get 1,200 mg (milligrams) of calcium every day. ? Vitamin D. Women need 600-800 International Units of vitamin D each day. ? Vitamins B12 and B6. Aim for 50 micrograms of B12 and 1.5 mg of B6 each day. General instructions  Keep track of your menstrual periods, including: ? When they occur. ? How heavy they are and how long they last. ? How much time passes between periods.  Keep track of your symptoms, noting when they start, how often you have them, and how long they last.  Use vaginal lubricants or moisturizers to help with vaginal dryness and improve comfort during sex.  Keep all follow-up visits as told by your health care provider. This is important. This includes any group therapy or counseling. Contact a health care provider if:  You are still having menstrual periods after age 55.  You have pain during sex.  You have not had a period for 12 months and you develop vaginal bleeding. Get help right away if:  You have: ? Severe depression. ? Excessive vaginal bleeding. ? Pain when you urinate. ? A fast or irregular heart beat (palpitations). ? Severe headaches. ? Abdomen (abdominal) pain or severe indigestion.  You fell and you think you have a broken bone.  You develop leg or chest pain.  You develop vision problems.  You feel a lump in your breast. Summary  Menopause is the normal  time of life when menstrual periods stop completely. It is usually confirmed by 12 months without a menstrual period.  The transition to menopause (perimenopause) most often happens between the ages of 45 and 55.  Symptoms can be managed through medicines, lifestyle changes, and complementary therapies such as acupuncture.  Eat a balanced diet that is rich in nutrients to promote bone health and heart health and to manage symptoms during menopause. This information is not intended to replace advice given to you by your health care provider. Make sure you discuss any questions you have with your health care provider. Document Released: 11/18/2003 Document Revised: 08/10/2017 Document Reviewed: 09/30/2016 Elsevier Patient Education  2020 [employer]  

## 2019-07-26 LAB — VITAMIN D 25 HYDROXY (VIT D DEFICIENCY, FRACTURES): Vit D, 25-Hydroxy: 23.1 ng/mL — ABNORMAL LOW (ref 30.0–100.0)

## 2019-07-27 ENCOUNTER — Other Ambulatory Visit: Payer: Self-pay | Admitting: Nurse Practitioner

## 2019-07-27 DIAGNOSIS — E559 Vitamin D deficiency, unspecified: Secondary | ICD-10-CM

## 2019-07-27 MED ORDER — CHOLECALCIFEROL 1.25 MG (50000 UT) PO TABS: 1.0000 | ORAL_TABLET | ORAL | 0 refills | Status: DC

## 2019-09-11 ENCOUNTER — Ambulatory Visit: Payer: Self-pay | Admitting: Nurse Practitioner

## 2019-09-15 ENCOUNTER — Encounter: Payer: Self-pay | Admitting: Nurse Practitioner

## 2019-09-15 ENCOUNTER — Other Ambulatory Visit: Payer: Self-pay

## 2019-09-15 ENCOUNTER — Ambulatory Visit (INDEPENDENT_AMBULATORY_CARE_PROVIDER_SITE_OTHER): Payer: BLUE CROSS/BLUE SHIELD | Admitting: Nurse Practitioner

## 2019-09-15 DIAGNOSIS — F418 Other specified anxiety disorders: Secondary | ICD-10-CM

## 2019-09-15 MED ORDER — TRAZODONE HCL 50 MG PO TABS: 25.0000 mg | ORAL_TABLET | Freq: Every evening | ORAL | 3 refills | Status: DC | PRN

## 2019-09-15 MED ORDER — CITALOPRAM HYDROBROMIDE 40 MG PO TABS: 40.0000 mg | ORAL_TABLET | Freq: Every day | ORAL | 3 refills | Status: DC

## 2019-09-15 NOTE — Patient Instructions (Signed)

## 2019-09-15 NOTE — Progress Notes (Signed)
There were no vitals taken for this visit.   Subjective:    Patient ID: Karen Lambert, female    DOB: [birth year], 59 y.o.   MRN: [id number]  HPI: Karen Lambert is a 59 y.o. female  Chief Complaint  Patient presents with  . Anxiety    . This visit was completed via FaceTime due to the restrictions of the COVID-19 pandemic. All issues as above were discussed and addressed. Physical exam was done as above through visual confirmation on FaceTime. If it was felt that the patient should be evaluated in the office, they were directed there. The patient verbally consented to this visit. . Location of the patient: home . Location of the provider: work . Those involved with this call:  . Provider: Marnee Guarneri, DNP . CMA: Yvonna Alanis, CMA . Front Desk/Registration: Jill Side  . Time spent on call: 15 minutes with patient face to face via video conference. More than 50% of this time was spent in counseling and coordination of care. 10 minutes total spent in review of patient's record and preparation of their chart.  . I verified patient identity using two factors (patient name and date of birth). Patient consents verbally to being seen via telemedicine visit today.    ANXIETY/STRESS Recently reports increase in anxiety due to brother being diagnosed with lymphoma and taking over caregiver for mother.   Duration:exacerbated Anxious mood: yes  Excessive worrying: yes Irritability: yes  Sweating: no Nausea: no Palpitations:no Hyperventilation: no Panic attacks: no Agoraphobia: no  Obscessions/compulsions: no Depressed mood: yes Depression screen [facility] 2/9 11/27/2018 11/13/2018  Decreased Interest 0 1  Down, Depressed, Hopeless 0 1  PHQ - 2 Score 0 2  Altered sleeping 1 2  Tired, decreased energy 2 2  Change in appetite 1 2  Feeling bad or failure about yourself  0 1  Trouble concentrating 1 1  Moving slowly or fidgety/restless 0 0  Suicidal thoughts 0 0  PHQ-9 Score 5 10    Difficult doing work/chores Not difficult at all Somewhat difficult   Anhedonia: no Weight changes: no Insomnia: yes hard to fall asleep  Hypersomnia: no Fatigue/loss of energy: yes Feelings of worthlessness: no Feelings of guilt: no Impaired concentration/indecisiveness: yes Suicidal ideations: no  Crying spells: no Recent Stressors/Life Changes: yes   Relationship problems: no   Family stress: yes     Financial stress: no    Job stress: no    Recent death/loss: no GAD 7 : Generalized Anxiety Score 11/27/2018 11/13/2018  Nervous, Anxious, on Edge 1 1  Control/stop worrying 1 2  Worry too much - different things 1 2  Trouble relaxing 1 2  Restless 0 1  Easily annoyed or irritable 0 2  Afraid - awful might happen 0 0  Total GAD 7 Score 4 10  Anxiety Difficulty Not difficult at all Somewhat difficult    Relevant past medical, surgical, family and social history reviewed and updated as indicated. Interim medical history since our last visit reviewed. Allergies and medications reviewed and updated.  Review of Systems  Constitutional: Negative.   Respiratory: Negative.   Cardiovascular: Negative.   Gastrointestinal: Negative.   Neurological: Negative.   Psychiatric/Behavioral: Positive for decreased concentration and sleep disturbance. Negative for self-injury and suicidal ideas. The patient is nervous/anxious.     Per HPI unless specifically indicated above     Objective:    There were no vitals taken for this visit.  Wt Readings from Last 3 Encounters:  07/25/19 159 lb (72.1 kg)  11/13/18 167 lb (75.8 kg)  12/20/15 176 lb (79.8 kg)    Physical Exam Vitals and nursing note reviewed.  Constitutional:      General: She is awake. She is not in acute distress.    Appearance: She is well-developed. She is not ill-appearing.  HENT:     Head: Normocephalic.     Right Ear: Hearing normal.     Left Ear: Hearing normal.  Eyes:     General: Lids are normal.         Right eye: No discharge.        Left eye: No discharge.     Conjunctiva/sclera: Conjunctivae normal.  Pulmonary:     Effort: Pulmonary effort is normal. No accessory muscle usage or respiratory distress.  Musculoskeletal:     Cervical back: Normal range of motion.  Neurological:     Mental Status: She is alert and oriented to person, place, and time.  Psychiatric:        Attention and Perception: Attention normal.        Mood and Affect: Mood normal.        Behavior: Behavior normal. Behavior is cooperative.        Thought Content: Thought content normal.        Judgment: Judgment normal.     Results for orders placed or performed in visit on 07/25/19  Vit D  25 hydroxy (rtn osteoporosis monitoring)  Result Value Ref Range   Vit D, 25-Hydroxy 23.1 (L) 30.0 - 100.0 ng/mL      Assessment & Plan:   Problem List Items Addressed This Visit      Other   Situational anxiety    Recently exacerbated by life events.  Denies SI/HI.  Will increase Citalopram to 40 MG daily, which benefits both mood and menopausal symptoms, plus add on Trazodone PRN 25-50 MG at HS for short period.  Scripts sent.  Discussed various methods of meditation and relaxation techniques at home.  Return in 4 weeks for follow-up.      Relevant Medications   citalopram (CELEXA) 40 MG tablet   traZODone (DESYREL) 50 MG tablet      I discussed the assessment and treatment plan with the patient. The patient was provided an opportunity to ask questions and all were answered. The patient agreed with the plan and demonstrated an understanding of the instructions.   The patient was advised to call back or seek an in-person evaluation if the symptoms worsen or if the condition fails to improve as anticipated.   I provided 15 minutes of time during this encounter.  Follow up plan: Return in about 4 weeks (around 10/13/2019) for Mood follow-up.

## 2019-09-15 NOTE — Assessment & Plan Note (Signed)
Recently exacerbated by life events.  Denies SI/HI.  Will increase Citalopram to 40 MG daily, which benefits both mood and menopausal symptoms, plus add on Trazodone PRN 25-50 MG at HS for short period.  Scripts sent.  Discussed various methods of meditation and relaxation techniques at home.  Return in 4 weeks for follow-up.

## 2019-09-17 ENCOUNTER — Encounter: Payer: Self-pay | Admitting: Nurse Practitioner

## 2019-09-17 NOTE — Progress Notes (Signed)
LVM for pt to call back to schedule 4wk f/u

## 2019-09-17 NOTE — Progress Notes (Signed)
LVM, letter sent through [place] and printed to mail.

## 2019-10-01 ENCOUNTER — Other Ambulatory Visit: Payer: 59

## 2019-10-01 ENCOUNTER — Other Ambulatory Visit: Payer: Self-pay

## 2019-10-01 ENCOUNTER — Telehealth: Payer: Self-pay | Admitting: Nurse Practitioner

## 2019-10-01 DIAGNOSIS — E559 Vitamin D deficiency, unspecified: Secondary | ICD-10-CM

## 2019-10-01 NOTE — Telephone Encounter (Signed)
Pt is coming in today to have labs done

## 2019-10-01 NOTE — Telephone Encounter (Signed)
Yes, there is future order in chart and would like this checked.  Please schedule lab only visit. Thank you.

## 2019-10-01 NOTE — Telephone Encounter (Signed)
Pt called in to inquire on whether or not she needs to have a vit d checked again being that she just finished the medication. Please advise.

## 2019-10-02 ENCOUNTER — Encounter: Payer: Self-pay | Admitting: Nurse Practitioner

## 2019-10-02 LAB — VITAMIN D 25 HYDROXY (VIT D DEFICIENCY, FRACTURES): Vit D, 25-Hydroxy: 52.1 ng/mL (ref 30.0–100.0)

## 2019-10-02 NOTE — Progress Notes (Signed)
Contacted via MyChart

## 2019-10-09 ENCOUNTER — Encounter: Payer: Self-pay | Admitting: Nurse Practitioner

## 2019-10-09 ENCOUNTER — Other Ambulatory Visit: Payer: Self-pay | Admitting: Nurse Practitioner

## 2019-10-09 MED ORDER — GABAPENTIN 300 MG PO CAPS: ORAL_CAPSULE | ORAL | 3 refills | Status: DC

## 2019-10-15 ENCOUNTER — Other Ambulatory Visit: Payer: Self-pay | Admitting: Nurse Practitioner

## 2019-10-15 MED ORDER — GABAPENTIN 300 MG PO CAPS: ORAL_CAPSULE | ORAL | 3 refills | Status: DC

## 2019-12-21 ENCOUNTER — Other Ambulatory Visit: Payer: Self-pay | Admitting: Nurse Practitioner

## 2019-12-21 NOTE — Telephone Encounter (Signed)
oxybutynin (DITROPAN-XL) 5 MG 24 hr tablet  Quantity: 90 tablet  Refills: 2  Start: 06/12/2019  Sig: Take 1 tablet (5 mg total) by mouth at bedtime.  Patient taking differently: Take 5 mg by mouth 2 (two) times daily. At bed time  Route: Oral

## 2019-12-21 NOTE — Telephone Encounter (Signed)
Requested medication (s) are due for refill today: yes  Requested medication (s) are on the active medication list: yes  Last refill:  06/12/19  Future visit scheduled: yes  Notes to clinic:   oxybutynin (DITROPAN-XL) 5 MG 24 hr tablet  Quantity: 90 tablet  Refills: 2  Start: 06/12/2019  Sig:   Take 1 tablet (5 mg total) by mouth at bedtime.  Patient taking differently:   Take 5 mg by mouth 2 (two) times daily. At bed time  Pt is taking med differently than what ordered and what is on [facility]   Requested Prescriptions  Pending Prescriptions Disp Refills   oxybutynin (DITROPAN-XL) 5 MG 24 hr tablet [Pharmacy Med Name: OXYBUTYNIN ER 5MG  TABLETS] 90 tablet 2    Sig: TAKE 1 TABLET(5 MG) BY MOUTH AT BEDTIME      Urology:  Bladder Agents Passed - 12/21/2019  3:03 PM      Passed - Valid encounter within last 12 months    Recent Outpatient Visits           3 months ago Situational anxiety   [place], [place] T, NP   4 months ago Vitamin D deficiency   [place], [place] T, NP   5 months ago Hot flashes due to menopause   [employer], Barbaraann Faster, NP   6 months ago Pure hypercholesterolemia   [place] [facility], [place] T, NP   11 months ago Flank pain   [employer], [place], DO       Future Appointments             In 5 days [place], Barbaraann Faster, NP [employer]

## 2019-12-22 NOTE — Telephone Encounter (Signed)
Routing to provider  

## 2019-12-26 ENCOUNTER — Encounter: Payer: Self-pay | Admitting: Nurse Practitioner

## 2019-12-26 ENCOUNTER — Ambulatory Visit (INDEPENDENT_AMBULATORY_CARE_PROVIDER_SITE_OTHER): Payer: 59 | Admitting: Nurse Practitioner

## 2019-12-26 ENCOUNTER — Other Ambulatory Visit: Payer: Self-pay

## 2019-12-26 ENCOUNTER — Ambulatory Visit: Payer: BLUE CROSS/BLUE SHIELD | Admitting: Nurse Practitioner

## 2019-12-26 VITALS — BP 122/68 | HR 64 | Temp 98.3°F | Wt 165.0 lb

## 2019-12-26 DIAGNOSIS — F411 Generalized anxiety disorder: Secondary | ICD-10-CM

## 2019-12-26 DIAGNOSIS — E559 Vitamin D deficiency, unspecified: Secondary | ICD-10-CM | POA: Diagnosis not present

## 2019-12-26 DIAGNOSIS — Z6832 Body mass index (BMI) 32.0-32.9, adult: Secondary | ICD-10-CM

## 2019-12-26 DIAGNOSIS — E6609 Other obesity due to excess calories: Secondary | ICD-10-CM | POA: Diagnosis not present

## 2019-12-26 DIAGNOSIS — E7800 Pure hypercholesterolemia, unspecified: Secondary | ICD-10-CM

## 2019-12-26 NOTE — Assessment & Plan Note (Signed)
Chronic, ongoing.  ASCVD last 3.9%.  Repeat lipid panel today and focus on diet.  Non fasting.

## 2019-12-26 NOTE — Assessment & Plan Note (Signed)
Chronic, stable.  Recent loss of brother caused some increased anxiety, but reports no need to adjust medications.  Denies SI/HI.  Continue Citalopram 40 MG daily, which benefits both mood and menopausal symptoms, plus Trazodone PRN 25-50 MG at HS for short period for sleep needs.  Discussed various methods of meditation and relaxation techniques at home.  Return in 6 months.

## 2019-12-26 NOTE — Patient Instructions (Signed)

## 2019-12-26 NOTE — Assessment & Plan Note (Signed)
Recommended eating smaller high protein, low fat meals more frequently and exercising 30 mins a day 5 times a week with a goal of 10-15lb weight loss in the next 3 months. Patient voiced their understanding and motivation to adhere to these recommendations.  

## 2019-12-26 NOTE — Assessment & Plan Note (Signed)
Chronic, ongoing.  Continue supplement and recheck Vitamin D level today.  May consider return to Vit D weekly if on lower side. 

## 2019-12-26 NOTE — Progress Notes (Signed)
BP 122/68   Pulse 64   Temp 98.3 F (36.8 C) (Oral)   Wt 165 lb (74.8 kg)   SpO2 98%   BMI 32.22 kg/m    Subjective:    Patient ID: Karen Lambert, female    DOB: Feb 04, 1961, 59 y.o.   MRN: [id number]  HPI: Karen Lambert is a 59 y.o. female  Chief Complaint  Patient presents with  . Hyperlipidemia  . Vitamin D  . Anxiety   HYPERLIPIDEMIA No current medications, diet focused.  LDL 166 and TCHOL 285 at last visit in September.  She is working on diet, cutting back on alcohol and eating healthy foods. Hyperlipidemia status: good compliance Supplements: none Aspirin:  no The 10-year ASCVD risk score Mikey Bussing DC Jr., et al., 2013) is: 3.9%   Values used to calculate the score:     Age: 61 years     Sex: Female     Is Non-Hispanic African American: No     Diabetic: No     Tobacco smoker: No     Systolic Blood Pressure: 123XX123 mmHg     Is BP treated: No     HDL Cholesterol: 45 mg/dL     Total Cholesterol: 285 mg/dL Chest pain:  no Coronary artery disease:  no Family history CAD:  no Family history early CAD:  no   VITAMIN D DEFICIENCY: Continues on daily supplement.  No recent fractures or falls.  Last level was 52.1 in January.  She reports feeling like her Vitamin D is low again because when it is low she gets fatigued.  ANXIETY/STRESS Continues on Celexa 40 MG and Trazodone 50 MG at night.  Previously has taken Zoloft 50 MG.  Her brother just passed away, which is a stressor. Duration:stable Anxious mood: yes -- brother just passed Excessive worrying: yes -- brother just passed Irritability: no  Sweating: no Nausea: no Palpitations:no Hyperventilation: no Panic attacks: no Agoraphobia: no  Obscessions/compulsions: no Depressed mood: no Depression screen [facility] 2/9 12/26/2019 11/27/2018 11/13/2018  Decreased Interest 1 0 1  Down, Depressed, Hopeless 1 0 1  PHQ - 2 Score 2 0 2  Altered sleeping 2 1 2   Tired, decreased energy 3 2 2   Change in appetite 1 1 2   Feeling  bad or failure about yourself  0 0 1  Trouble concentrating 0 1 1  Moving slowly or fidgety/restless 0 0 0  Suicidal thoughts 0 0 0  PHQ-9 Score 8 5 10   Difficult doing work/chores Somewhat difficult Not difficult at all Somewhat difficult   Anhedonia: no Weight changes: no Insomnia: yes hard to stay asleep  Hypersomnia: no Fatigue/loss of energy: no Feelings of worthlessness: no Feelings of guilt: no Impaired concentration/indecisiveness: no Suicidal ideations: no  Crying spells: no Recent Stressors/Life Changes: yes   Relationship problems: no   Family stress: yes     Financial stress: no    Job stress: no    Recent death/loss: no GAD 7 : Generalized Anxiety Score 12/26/2019 11/27/2018 11/13/2018  Nervous, Anxious, on Edge 3 1 1   Control/stop worrying 3 1 2   Worry too much - different things 3 1 2   Trouble relaxing 1 1 2   Restless 1 0 1  Easily annoyed or irritable 3 0 2  Afraid - awful might happen 1 0 0  Total GAD 7 Score 15 4 10   Anxiety Difficulty Somewhat difficult Not difficult at all Somewhat difficult    Relevant past medical, surgical, family and social history  reviewed and updated as indicated. Interim medical history since our last visit reviewed. Allergies and medications reviewed and updated.  Review of Systems  Constitutional: Negative for activity change, appetite change, diaphoresis, fatigue and fever.  Respiratory: Negative for cough, chest tightness and shortness of breath.   Cardiovascular: Negative for chest pain, palpitations and leg swelling.  Gastrointestinal: Negative.   Neurological: Negative.   Psychiatric/Behavioral: Positive for decreased concentration and sleep disturbance. Negative for self-injury and suicidal ideas. The patient is nervous/anxious.     Per HPI unless specifically indicated above     Objective:    BP 122/68   Pulse 64   Temp 98.3 F (36.8 C) (Oral)   Wt 165 lb (74.8 kg)   SpO2 98%   BMI 32.22 kg/m   Wt Readings  from Last 3 Encounters:  12/26/19 165 lb (74.8 kg)  07/25/19 159 lb (72.1 kg)  11/13/18 167 lb (75.8 kg)    Physical Exam Vitals and nursing note reviewed.  Constitutional:      General: She is awake. She is not in acute distress.    Appearance: She is well-developed. She is not ill-appearing.  HENT:     Head: Normocephalic.     Right Ear: Hearing normal.     Left Ear: Hearing normal.  Eyes:     General: Lids are normal.        Right eye: No discharge.        Left eye: No discharge.     Conjunctiva/sclera: Conjunctivae normal.     Pupils: Pupils are equal, round, and reactive to light.  Neck:     Vascular: No carotid bruit.  Cardiovascular:     Rate and Rhythm: Normal rate and regular rhythm.     Heart sounds: Normal heart sounds. No murmur. No gallop.   Pulmonary:     Effort: Pulmonary effort is normal. No accessory muscle usage or respiratory distress.     Breath sounds: Normal breath sounds.  Abdominal:     General: Bowel sounds are normal.     Palpations: Abdomen is soft.  Musculoskeletal:     Cervical back: Normal range of motion and neck supple.     Right lower leg: No edema.     Left lower leg: No edema.  Skin:    General: Skin is warm and dry.  Neurological:     Mental Status: She is alert and oriented to person, place, and time.  Psychiatric:        Attention and Perception: Attention normal.        Mood and Affect: Mood normal.        Behavior: Behavior normal. Behavior is cooperative.        Thought Content: Thought content normal.        Judgment: Judgment normal.     Results for orders placed or performed in visit on 10/01/19  Vit D  25 hydroxy (rtn osteoporosis monitoring)  Result Value Ref Range   Vit D, 25-Hydroxy 52.1 30.0 - 100.0 ng/mL      Assessment & Plan:   Problem List Items Addressed This Visit      Other   Generalized anxiety disorder - Primary    Chronic, stable.  Recent loss of brother caused some increased anxiety, but reports no  need to adjust medications.  Denies SI/HI.  Continue Citalopram 40 MG daily, which benefits both mood and menopausal symptoms, plus Trazodone PRN 25-50 MG at HS for short period for sleep needs.  Discussed  various methods of meditation and relaxation techniques at home.  Return in 6 months.      Relevant Orders   Basic metabolic panel   Pure hypercholesterolemia    Chronic, ongoing.  ASCVD last 3.9%.  Repeat lipid panel today and focus on diet.  Non fasting.      Relevant Orders   Lipid Panel w/o Chol/HDL Ratio   Basic metabolic panel   Vitamin D deficiency    Chronic, ongoing.  Continue supplement and recheck Vitamin D level today.  May consider return to Vit D weekly if on lower side.      Relevant Orders   VITAMIN D 25 Hydroxy (Vit-D Deficiency, Fractures)   Obesity    Recommended eating smaller high protein, low fat meals more frequently and exercising 30 mins a day 5 times a week with a goal of 10-15lb weight loss in the next 3 months. Patient voiced their understanding and motivation to adhere to these recommendations.           Follow up plan: Return in about 6 months (around 06/26/2020) for Annual physical.

## 2019-12-28 ENCOUNTER — Encounter: Payer: Self-pay | Admitting: Nurse Practitioner

## 2019-12-28 NOTE — Progress Notes (Signed)
Contacted via MyChart The 10-year ASCVD risk score Mikey Bussing DC Jr., et al., 2013) is: 3.7%   Values used to calculate the score:     Age: 59 years     Sex: Female     Is Non-Hispanic African American: No     Diabetic: No     Tobacco smoker: No     Systolic Blood Pressure: 123XX123 mmHg     Is BP treated: No     HDL Cholesterol: 37 mg/dL     Total Cholesterol: 225 mg/dL

## 2019-12-29 LAB — BASIC METABOLIC PANEL
BUN/Creatinine Ratio: 20 (ref 9–23)
BUN: 13 mg/dL (ref 6–24)
CO2: 23 mmol/L (ref 20–29)
Calcium: 9.4 mg/dL (ref 8.7–10.2)
Chloride: 102 mmol/L (ref 96–106)
Creatinine, Ser: 0.65 mg/dL (ref 0.57–1.00)
GFR calc Af Amer: 113 mL/min/{1.73_m2} (ref >59)
GFR calc non Af Amer: 98 mL/min/{1.73_m2} (ref >59)
Glucose: 89 mg/dL (ref 65–99)
Potassium: 4.4 mmol/L (ref 3.5–5.2)
Sodium: 138 mmol/L (ref 134–144)

## 2019-12-29 LAB — LIPID PANEL W/O CHOL/HDL RATIO
Cholesterol, Total: 225 mg/dL — ABNORMAL HIGH (ref 100–199)
HDL: 37 mg/dL — ABNORMAL LOW (ref >39)
LDL Chol Calc (NIH): 84 mg/dL (ref 0–99)
Triglycerides: 644 mg/dL (ref 0–149)
VLDL Cholesterol Cal: 104 mg/dL — ABNORMAL HIGH (ref 5–40)

## 2019-12-29 LAB — VITAMIN D 25 HYDROXY (VIT D DEFICIENCY, FRACTURES)

## 2020-01-02 ENCOUNTER — Other Ambulatory Visit: Payer: Self-pay | Admitting: Nurse Practitioner

## 2020-01-02 ENCOUNTER — Encounter: Payer: Self-pay | Admitting: Nurse Practitioner

## 2020-01-02 ENCOUNTER — Other Ambulatory Visit: Payer: Self-pay

## 2020-01-02 ENCOUNTER — Telehealth (INDEPENDENT_AMBULATORY_CARE_PROVIDER_SITE_OTHER): Payer: 59 | Admitting: Nurse Practitioner

## 2020-01-02 DIAGNOSIS — R300 Dysuria: Secondary | ICD-10-CM

## 2020-01-02 DIAGNOSIS — R399 Unspecified symptoms and signs involving the genitourinary system: Secondary | ICD-10-CM | POA: Insufficient documentation

## 2020-01-02 MED ORDER — NITROFURANTOIN MONOHYD MACRO 100 MG PO CAPS: 100.0000 mg | ORAL_CAPSULE | Freq: Two times a day (BID) | ORAL | 0 refills | Status: AC

## 2020-01-02 NOTE — Progress Notes (Signed)
There were no vitals taken for this visit.   Subjective:    Patient ID: Karen Lambert, female    DOB: June 08, 1961, 59 y.o.   MRN: [id number]  HPI: Karen Lambert is a 59 y.o. female  Chief Complaint  Patient presents with  . Urinary Tract Infection    pt states she has been having a fever and pain since yesterday     . This visit was completed via telephone due to the restrictions of the COVID-19 pandemic. All issues as above were discussed and addressed but no physical exam was performed. If it was felt that the patient should be evaluated in the office, they were directed there. The patient verbally consented to this visit. Patient was unable to complete an audio/visual visit due to Technical difficulties,Lack of internet. Due to the catastrophic nature of the COVID-19 pandemic, this visit was done through audio contact only. . Location of the patient: home . Location of the provider: work . Those involved with this call:  . Provider: Marnee Guarneri, DNP . CMA: Yvonna Alanis, CMA . Front Desk/Registration: Don Perking  . Time spent on call: 15 minutes on the phone discussing health concerns. 10 minutes total spent in review of patient's record and preparation of their chart.  . I verified patient identity using two factors (patient name and date of birth). Patient consents verbally to being seen via telemedicine visit today.    URINARY SYMPTOMS Started with symptoms yesterday, did not feel good.  Had pressure and voiding small amounts and abdominal pain in suprapubic area. Dysuria: no Urinary frequency: yes Urgency: yes Small volume voids: yes Symptom severity: yes Urinary incontinence: no Foul odor: no Hematuria: no Abdominal pain: yes Back pain: no Suprapubic pain/pressure: yes Flank pain: no Fever:  yes -- 101 last night -- no fever today Vomiting: no Relief with cranberry juice: no Relief with pyridium: yes Status: stable Previous urinary tract  infection: yes, not in a long time Recurrent urinary tract infection: no Sexual activity: No sexually active History of sexually transmitted disease: no Treatments attempted: pyridium and increasing fluids   Relevant past medical, surgical, family and social history reviewed and updated as indicated. Interim medical history since our last visit reviewed. Allergies and medications reviewed and updated.  Review of Systems  Constitutional: Positive for fever (none today). Negative for activity change, appetite change, diaphoresis and fatigue.  Respiratory: Negative for cough, chest tightness and shortness of breath.   Cardiovascular: Negative for chest pain, palpitations and leg swelling.  Gastrointestinal: Positive for abdominal pain (suprapubic). Negative for abdominal distention, nausea and vomiting.  Genitourinary: Positive for decreased urine volume, frequency and urgency. Negative for difficulty urinating, dysuria, flank pain, hematuria, menstrual problem and vaginal discharge.  Neurological: Negative.   Psychiatric/Behavioral: Negative.     Per HPI unless specifically indicated above     Objective:    There were no vitals taken for this visit.  Wt Readings from Last 3 Encounters:  12/26/19 165 lb (74.8 kg)  07/25/19 159 lb (72.1 kg)  11/13/18 167 lb (75.8 kg)    Physical Exam   Unable to perform due to telephone visit only  Results for orders placed or performed in visit on 12/26/19  Lipid Panel w/o Chol/HDL Ratio  Result Value Ref Range   Cholesterol, Total 225 (H) 100 - 199 mg/dL   Triglycerides 644 (HH) 0 - 149 mg/dL   HDL 37 (L) >39 mg/dL   VLDL Cholesterol Cal 104 (H) 5 - 40  mg/dL   LDL Chol Calc (NIH) 84 0 - 99 mg/dL  VITAMIN D 25 Hydroxy (Vit-D Deficiency, Fractures)  Result Value Ref Range   Vit D, 25-Hydroxy CANCELED ng/mL  Basic metabolic panel  Result Value Ref Range   Glucose 89 65 - 99 mg/dL   BUN 13 6 - 24 mg/dL   Creatinine, Ser 0.65 0.57 - 1.00  mg/dL   GFR calc non Af Amer 98 >59 mL/min/1.73   GFR calc Af Amer 113 >59 mL/min/1.73   BUN/Creatinine Ratio 20 9 - 23   Sodium 138 134 - 144 mmol/L   Potassium 4.4 3.5 - 5.2 mmol/L   Chloride 102 96 - 106 mmol/L   CO2 23 20 - 29 mmol/L   Calcium 9.4 8.7 - 10.2 mg/dL      Assessment & Plan:   Problem List Items Addressed This Visit      Other   UTI symptoms    Acute for 24 hours, no further fever. UA noting few bacteria and hyaline casts, no RBC.  Unable to perform dipstick due to Azo on board.  Will send in Macrobid 100 MG BID x 5 days.  Recommend she continue Azo and increased fluid intake.  May take Tylenol as needed for fever or discomfort.  Recommend if worsening symptoms over weekend, such as increased pain or fever, then to present immediately to UC or ER.  Return to office for worsening or ongoing symptoms.           I discussed the assessment and treatment plan with the patient. The patient was provided an opportunity to ask questions and all were answered. The patient agreed with the plan and demonstrated an understanding of the instructions.   The patient was advised to call back or seek an in-person evaluation if the symptoms worsen or if the condition fails to improve as anticipated.   I provided 15+ minutes of time during this encounter.  Follow up plan: Return if symptoms worsen or fail to improve.

## 2020-01-02 NOTE — Assessment & Plan Note (Signed)
Acute for 24 hours, no further fever. UA noting few bacteria and hyaline casts, no RBC.  Unable to perform dipstick due to Azo on board.  Will send in Macrobid 100 MG BID x 5 days.  Recommend she continue Azo and increased fluid intake.  May take Tylenol as needed for fever or discomfort.  Recommend if worsening symptoms over weekend, such as increased pain or fever, then to present immediately to UC or ER.  Return to office for worsening or ongoing symptoms.

## 2020-01-02 NOTE — Patient Instructions (Signed)
Urinary Tract Infection, Adult A urinary tract infection (UTI) is an infection of any part of the urinary tract. The urinary tract includes:  The kidneys.  The ureters.  The bladder.  The urethra. These organs make, store, and get rid of pee (urine) in the body. What are the causes? This is caused by germs (bacteria) in your genital area. These germs grow and cause swelling (inflammation) of your urinary tract. What increases the risk? You are more likely to develop this condition if:  You have a small, thin tube (catheter) to drain pee.  You cannot control when you pee or poop (incontinence).  You are female, and: ? You use these methods to prevent pregnancy:  A medicine that kills sperm (spermicide).  A device that blocks sperm (diaphragm). ? You have low levels of a female hormone (estrogen). ? You are pregnant.  You have genes that add to your risk.  You are sexually active.  You take antibiotic medicines.  You have trouble peeing because of: ? A prostate that is bigger than normal, if you are female. ? A blockage in the part of your body that drains pee from the bladder (urethra). ? A kidney stone. ? A nerve condition that affects your bladder (neurogenic bladder). ? Not getting enough to drink. ? Not peeing often enough.  You have other conditions, such as: ? Diabetes. ? A weak disease-fighting system (immune system). ? Sickle cell disease. ? Gout. ? Injury of the spine. What are the signs or symptoms? Symptoms of this condition include:  Needing to pee right away (urgently).  Peeing often.  Peeing small amounts often.  Pain or burning when peeing.  Blood in the pee.  Pee that smells bad or not like normal.  Trouble peeing.  Pee that is cloudy.  Fluid coming from the vagina, if you are female.  Pain in the belly or lower back. Other symptoms include:  Throwing up (vomiting).  No urge to eat.  Feeling mixed up (confused).  Being tired  and grouchy (irritable).  A fever.  Watery poop (diarrhea). How is this treated? This condition may be treated with:  Antibiotic medicine.  Other medicines.  Drinking enough water. Follow these instructions at home:  Medicines  Take over-the-counter and prescription medicines only as told by your doctor.  If you were prescribed an antibiotic medicine, take it as told by your doctor. Do not stop taking it even if you start to feel better. General instructions  Make sure you: ? Pee until your bladder is empty. ? Do not hold pee for a long time. ? Empty your bladder after sex. ? Wipe from front to back after pooping if you are a female. Use each tissue one time when you wipe.  Drink enough fluid to keep your pee pale yellow.  Keep all follow-up visits as told by your doctor. This is important. Contact a doctor if:  You do not get better after 1-2 days.  Your symptoms go away and then come back. Get help right away if:  You have very bad back pain.  You have very bad pain in your lower belly.  You have a fever.  You are sick to your stomach (nauseous).  You are throwing up. Summary  A urinary tract infection (UTI) is an infection of any part of the urinary tract.  This condition is caused by germs in your genital area.  There are many risk factors for a UTI. These include having a small, thin   tube to drain pee and not being able to control when you pee or poop.  Treatment includes antibiotic medicines for germs.  Drink enough fluid to keep your pee pale yellow. This information is not intended to replace advice given to you by your health care provider. Make sure you discuss any questions you have with your health care provider. Document Revised: 08/15/2018 Document Reviewed: 03/07/2018 Elsevier Patient Education  2020 [employer]  

## 2020-01-04 LAB — URINE CULTURE, REFLEX

## 2020-01-04 LAB — UA/M W/RFLX CULTURE, ROUTINE

## 2020-01-04 LAB — MICROSCOPIC EXAMINATION: RBC, Urine: NONE SEEN /hpf (ref 0–2)

## 2020-01-05 NOTE — Progress Notes (Signed)
Contacted via MyChart

## 2020-01-19 ENCOUNTER — Other Ambulatory Visit: Payer: Self-pay | Admitting: Nurse Practitioner

## 2020-01-19 DIAGNOSIS — R0683 Snoring: Secondary | ICD-10-CM | POA: Insufficient documentation

## 2020-01-19 DIAGNOSIS — G4733 Obstructive sleep apnea (adult) (pediatric): Secondary | ICD-10-CM | POA: Insufficient documentation

## 2020-01-19 NOTE — Progress Notes (Signed)
Patient request for sleep study due to snoring.

## 2020-01-21 ENCOUNTER — Telehealth: Payer: Self-pay | Admitting: Nurse Practitioner

## 2020-01-21 NOTE — Telephone Encounter (Signed)
Resent referral and addend to state to whomever takes her insurance.  Please alert referral team and patient.

## 2020-01-21 NOTE — Addendum Note (Signed)
Addended by: Marnee Guarneri T on: 01/21/2020 03:14 PM   Modules accepted: Orders

## 2020-01-21 NOTE — Telephone Encounter (Signed)
Sleep Doctor per Dr. Suezanne Jacquet referral has called and stated that a referral would need to be made elsewhere as they do not take Karen Lambert's insurance. Pls FU with pt

## 2020-02-07 ENCOUNTER — Other Ambulatory Visit: Payer: Self-pay | Admitting: Nurse Practitioner

## 2020-02-07 NOTE — Telephone Encounter (Signed)
Requested Prescriptions  Pending Prescriptions Disp Refills  . traZODone (DESYREL) 50 MG tablet [Pharmacy Med Name: TRAZODONE 50MG  TABLETS] 30 tablet 3    Sig: TAKE 1/2 TO 1 TABLET(25 TO 50 MG) BY MOUTH AT BEDTIME AS NEEDED FOR SLEEP     Psychiatry: Antidepressants - Serotonin Modulator Passed - 02/07/2020  3:21 AM      Passed - Valid encounter within last 6 months    Recent Outpatient Visits          1 month ago UTI symptoms   [facility] [place], [place] T, NP   1 month ago Generalized anxiety disorder   [facility] [place], Barbaraann Faster, NP   4 months ago Situational anxiety   [facility] [place], [place] T, NP   6 months ago Vitamin D deficiency   [facility] [place], [place] T, NP   7 months ago Hot flashes due to menopause   [facility], Barbaraann Faster, NP      Future Appointments            In 4 months [facility], Barbaraann Faster, NP [facility]

## 2020-03-02 NOTE — Addendum Note (Signed)
Addended by: Marnee Guarneri T on: 03/02/2020 12:00 PM   Modules accepted: Orders

## 2020-04-13 MED ORDER — AMOXICILLIN 875 MG PO TABS: 875.0000 mg | ORAL_TABLET | Freq: Two times a day (BID) | ORAL | 0 refills | Status: DC

## 2020-05-04 ENCOUNTER — Encounter: Payer: Self-pay | Admitting: Nurse Practitioner

## 2020-05-04 ENCOUNTER — Other Ambulatory Visit: Payer: Self-pay

## 2020-05-04 ENCOUNTER — Telehealth (INDEPENDENT_AMBULATORY_CARE_PROVIDER_SITE_OTHER): Payer: 59 | Admitting: Nurse Practitioner

## 2020-05-04 DIAGNOSIS — J3489 Other specified disorders of nose and nasal sinuses: Secondary | ICD-10-CM | POA: Diagnosis not present

## 2020-05-04 MED ORDER — FLUTICASONE PROPIONATE 50 MCG/ACT NA SUSP: 2.0000 | Freq: Every day | NASAL | 6 refills | Status: DC

## 2020-05-04 NOTE — Patient Instructions (Signed)
COVID-19 COVID-19 is a respiratory infection that is caused by a virus called severe acute respiratory syndrome coronavirus 2 (SARS-CoV-2). The disease is also known as coronavirus disease or novel coronavirus. In some people, the virus may not cause any symptoms. In others, it may cause a serious infection. The infection can get worse quickly and can lead to complications, such as:  Pneumonia, or infection of the lungs.  Acute respiratory distress syndrome or ARDS. This is a condition in which fluid build-up in the lungs prevents the lungs from filling with air and passing oxygen into the blood.  Acute respiratory failure. This is a condition in which there is not enough oxygen passing from the lungs to the body or when carbon dioxide is not passing from the lungs out of the body.  Sepsis or septic shock. This is a serious bodily reaction to an infection.  Blood clotting problems.  Secondary infections due to bacteria or fungus.  Organ failure. This is when your body's organs stop working. The virus that causes COVID-19 is contagious. This means that it can spread from person to person through droplets from coughs and sneezes (respiratory secretions). What are the causes? This illness is caused by a virus. You may catch the virus by:  Breathing in droplets from an infected person. Droplets can be spread by a person breathing, speaking, singing, coughing, or sneezing.  Touching something, like a table or a doorknob, that was exposed to the virus (contaminated) and then touching your mouth, nose, or eyes. What increases the risk? Risk for infection You are more likely to be infected with this virus if you:  Are within 6 feet (2 meters) of a person with COVID-19.  Provide care for or live with a person who is infected with COVID-19.  Spend time in crowded indoor spaces or live in shared housing. Risk for serious illness You are more likely to become seriously ill from the virus if you:   Are 50 years of age or older. The higher your age, the more you are at risk for serious illness.  Live in a nursing home or long-term care facility.  Have cancer.  Have a long-term (chronic) disease such as: ? Chronic lung disease, including chronic obstructive pulmonary disease or asthma. ? A long-term disease that lowers your body's ability to fight infection (immunocompromised). ? Heart disease, including heart failure, a condition in which the arteries that lead to the heart become narrow or blocked (coronary artery disease), a disease which makes the heart muscle thick, weak, or stiff (cardiomyopathy). ? Diabetes. ? Chronic kidney disease. ? Sickle cell disease, a condition in which red blood cells have an abnormal "sickle" shape. ? Liver disease.  Are obese. What are the signs or symptoms? Symptoms of this condition can range from mild to severe. Symptoms may appear any time from 2 to 14 days after being exposed to the virus. They include:  A fever or chills.  A cough.  Difficulty breathing.  Headaches, body aches, or muscle aches.  Runny or stuffy (congested) nose.  A sore throat.  New loss of taste or smell. Some people may also have stomach problems, such as nausea, vomiting, or diarrhea. Other people may not have any symptoms of COVID-19. How is this diagnosed? This condition may be diagnosed based on:  Your signs and symptoms, especially if: ? You live in an area with a COVID-19 outbreak. ? You recently traveled to or from an area where the virus is common. ? You   provide care for or live with a person who was diagnosed with COVID-19. ? You were exposed to a person who was diagnosed with COVID-19.  A physical exam.  Lab tests, which may include: ? Taking a sample of fluid from the back of your nose and throat (nasopharyngeal fluid), your nose, or your throat using a swab. ? A sample of mucus from your lungs (sputum). ? Blood tests.  Imaging tests, which  may include, X-rays, CT scan, or ultrasound. How is this treated? At present, there is no medicine to treat COVID-19. Medicines that treat other diseases are being used on a trial basis to see if they are effective against COVID-19. Your health care provider will talk with you about ways to treat your symptoms. For most people, the infection is mild and can be managed at home with rest, fluids, and over-the-counter medicines. Treatment for a serious infection usually takes places in a hospital intensive care unit (ICU). It may include one or more of the following treatments. These treatments are given until your symptoms improve.  Receiving fluids and medicines through an IV.  Supplemental oxygen. Extra oxygen is given through a tube in the nose, a face mask, or a hood.  Positioning you to lie on your stomach (prone position). This makes it easier for oxygen to get into the lungs.  Continuous positive airway pressure (CPAP) or bi-level positive airway pressure (BPAP) machine. This treatment uses mild air pressure to keep the airways open. A tube that is connected to a motor delivers oxygen to the body.  Ventilator. This treatment moves air into and out of the lungs by using a tube that is placed in your windpipe.  Tracheostomy. This is a procedure to create a hole in the neck so that a breathing tube can be inserted.  Extracorporeal membrane oxygenation (ECMO). This procedure gives the lungs a chance to recover by taking over the functions of the heart and lungs. It supplies oxygen to the body and removes carbon dioxide. Follow these instructions at home: Lifestyle  If you are sick, stay home except to get medical care. Your health care provider will tell you how long to stay home. Call your health care provider before you go for medical care.  Rest at home as told by your health care provider.  Do not use any products that contain nicotine or tobacco, such as cigarettes, e-cigarettes, and  chewing tobacco. If you need help quitting, ask your health care provider.  Return to your normal activities as told by your health care provider. Ask your health care provider what activities are safe for you. General instructions  Take over-the-counter and prescription medicines only as told by your health care provider.  Drink enough fluid to keep your urine pale yellow.  Keep all follow-up visits as told by your health care provider. This is important. How is this prevented?  There is no vaccine to help prevent COVID-19 infection. However, there are steps you can take to protect yourself and others from this virus. To protect yourself:   Do not travel to areas where COVID-19 is a risk. The areas where COVID-19 is reported change often. To identify high-risk areas and travel restrictions, check the CDC travel website: wwwnc.cdc.gov/travel/notices  If you live in, or must travel to, an area where COVID-19 is a risk, take precautions to avoid infection. ? Stay away from people who are sick. ? Wash your hands often with soap and water for 20 seconds. If soap and water   are not available, use an alcohol-based hand sanitizer. ? Avoid touching your mouth, face, eyes, or nose. ? Avoid going out in public, follow guidance from your state and local health authorities. ? If you must go out in public, wear a cloth face covering or face mask. Make sure your mask covers your nose and mouth. ? Avoid crowded indoor spaces. Stay at least 6 feet (2 meters) away from others. ? Disinfect objects and surfaces that are frequently touched every day. This may include:  Counters and tables.  Doorknobs and light switches.  Sinks and faucets.  Electronics, such as phones, remote controls, keyboards, computers, and tablets. To protect others: If you have symptoms of COVID-19, take steps to prevent the virus from spreading to others.  If you think you have a COVID-19 infection, contact your health care  provider right away. Tell your health care team that you think you may have a COVID-19 infection.  Stay home. Leave your house only to seek medical care. Do not use public transport.  Do not travel while you are sick.  Wash your hands often with soap and water for 20 seconds. If soap and water are not available, use alcohol-based hand sanitizer.  Stay away from other members of your household. Let healthy household members care for children and pets, if possible. If you have to care for children or pets, wash your hands often and wear a mask. If possible, stay in your own room, separate from others. Use a different bathroom.  Make sure that all people in your household wash their hands well and often.  Cough or sneeze into a tissue or your sleeve or elbow. Do not cough or sneeze into your hand or into the air.  Wear a cloth face covering or face mask. Make sure your mask covers your nose and mouth. Where to find more information  Centers for Disease Control and Prevention: www.cdc.gov/coronavirus/2019-ncov/index.html  World Health Organization: www.who.int/health-topics/coronavirus Contact a health care provider if:  You live in or have traveled to an area where COVID-19 is a risk and you have symptoms of the infection.  You have had contact with someone who has COVID-19 and you have symptoms of the infection. Get help right away if:  You have trouble breathing.  You have pain or pressure in your chest.  You have confusion.  You have bluish lips and fingernails.  You have difficulty waking from sleep.  You have symptoms that get worse. These symptoms may represent a serious problem that is an emergency. Do not wait to see if the symptoms will go away. Get medical help right away. Call your local emergency services (911 in the [place] Do not drive yourself to the hospital. Let the emergency medical personnel know if you think you have COVID-19. Summary  COVID-19 is a  respiratory infection that is caused by a virus. It is also known as coronavirus disease or novel coronavirus. It can cause serious infections, such as pneumonia, acute respiratory distress syndrome, acute respiratory failure, or sepsis.  The virus that causes COVID-19 is contagious. This means that it can spread from person to person through droplets from breathing, speaking, singing, coughing, or sneezing.  You are more likely to develop a serious illness if you are 50 years of age or older, have a weak immune system, live in a nursing home, or have chronic disease.  There is no medicine to treat COVID-19. Your health care provider will talk with you about ways to treat your symptoms.    Take steps to protect yourself and others from infection. Wash your hands often and disinfect objects and surfaces that are frequently touched every day. Stay away from people who are sick and wear a mask if you are sick. This information is not intended to replace advice given to you by your health care provider. Make sure you discuss any questions you have with your health care provider. Document Revised: 06/27/2019 Document Reviewed: 10/03/2018 Elsevier Patient Education  2020 [employer] Inc.  

## 2020-05-04 NOTE — Progress Notes (Signed)
Temp 97.7 F (36.5 C)    Subjective:    Patient ID: Karen Lambert, female    DOB: 06/11/1961, 59 y.o.   MRN: [id number]  HPI: Karen Lambert is a 59 y.o. female  Chief Complaint  Patient presents with  . URI    pt states she has been having congestion, sinus pressure, slight cough, headache, runny nose, and scratchy throat since last Wednesday. States a coworker tested positive last week, her test was negative last week but has another test scheduled for today     . This visit was completed via telephone only due to the restrictions of the COVID-19 pandemic. All issues as above were discussed and addressed. Physical exam was done as above through visual confirmation on telephone. If it was felt that the patient should be evaluated in the office, they were directed there. The patient verbally consented to this visit. . Location of the patient: home . Location of the provider: work . Those involved with this call:  . Provider: Marnee Guarneri, DNP . CMA: Yvonna Alanis, CMA . Front Desk/Registration: Don Perking  . Time spent on call: 20 minutes on the phone discussing health concerns. 15 minutes total spent in review of patient's record and preparation of their chart.  . I verified patient identity using two factors (patient name and date of birth). Patient consents verbally to being seen via telemedicine visit today.   UPPER RESPIRATORY TRACT INFECTION Was treated on 04/13/20 with Amoxicillin for sinus infection -- does not feel this got to 100%.  Reports having ongoing symptoms, had a negative Covid test via health department on Thursday last week, but is getting retested today at 1 PM at [facility]. Started having new symptoms 04/28/20.  Denies loss of taste or smell.  Is vaccinated.  Her coworker, who works beside her and who she drove in car with, is also vaccinated but tested positive for Covid at same time patient tested negative and is having similar symptoms. Fever:  no Cough: no Shortness of breath: no Wheezing: no Chest pain: no Chest tightness: no Chest congestion: no Nasal congestion: yes Runny nose: yes Post nasal drip: yes Sneezing: yes Sore throat: yes Swollen glands: no Sinus pressure: yes -- above eyes Headache: yes Face pain: yes Toothache: no Ear pain: yes "right Ear pressure: yes bilateral Eyes red/itching:no Eye drainage/crusting: no  Vomiting: no Rash: no Fatigue: yes Sick contacts: yes -- a coworker who tested positive Strep contacts: no  Context: fluctuating Recurrent sinusitis: no Relief with OTC cold/cough medications: yes , minimal Treatments attempted: cold/sinus, anti-histamine and antibiotics   Relevant past medical, surgical, family and social history reviewed and updated as indicated. Interim medical history since our last visit reviewed. Allergies and medications reviewed and updated.  Review of Systems  Constitutional: Positive for fatigue. Negative for activity change, appetite change and fever.  HENT: Positive for congestion, postnasal drip, rhinorrhea, sinus pressure, sneezing and sore throat. Negative for ear discharge, ear pain, facial swelling, sinus pain and voice change.   Eyes: Negative for pain and visual disturbance.  Respiratory: Negative for cough, chest tightness, shortness of breath and wheezing.   Cardiovascular: Negative for chest pain, palpitations and leg swelling.  Gastrointestinal: Negative.   Endocrine: Negative.   Musculoskeletal: Negative for myalgias.  Neurological: Positive for headaches. Negative for dizziness and numbness.  Psychiatric/Behavioral: Negative.     Per HPI unless specifically indicated above     Objective:    Temp 97.7 F (36.5 C)  Wt Readings from Last 3 Encounters:  12/26/19 165 lb (74.8 kg)  07/25/19 159 lb (72.1 kg)  11/13/18 167 lb (75.8 kg)    Physical Exam   Unable to perform due to telephone visit only  Results for orders placed or performed  in visit on 01/02/20  Microscopic Examination   URINE  Result Value Ref Range   WBC, UA 0-5 0 - 5 /hpf   RBC None seen 0 - 2 /hpf   Epithelial Cells (non renal) 0-10 0 - 10 /hpf   Casts Present None seen /lpf   Cast Type Hyaline casts N/A   Bacteria, UA Few (A) None seen/Few  Urine Culture, Reflex   URINE  Result Value Ref Range   Urine Culture, Routine Final report    Organism ID, Bacteria Comment   UA/M w/rflx Culture, Routine   Specimen: Urine   URINE  Result Value Ref Range   Color, UA Orange Yellow   Appearance Ur Clear Clear   Microscopic Examination See below:    Urinalysis Reflex Comment       Assessment & Plan:   Problem List Items Addressed This Visit      Other   Sinus pressure    Acute, recent treatment with Amoxicillin -- then recent exposure to Covid positive patient last week with return of worsening symptoms (sinus pressure, rhinorrhea, sore throat).  Initial Covid test negative, but coworker was positive with similar symptoms.  She is retesting today as recommended.  At this time recommend continue OTC medications for symptoms relief + script for Flonase sent in.  Recommend she continue her daily Vitamin D + start taking Zinc and Vitamin C daily.  She will alert provider to Covid results and aware treatment will continue with OTC regimen due to length of time with symptoms.  Increase rest and hydration at home.  Self quarantine until results return, if positive 10 day quarantine recommended.  Follow-up as needed or for worsening.         I discussed the assessment and treatment plan with the patient. The patient was provided an opportunity to ask questions and all were answered. The patient agreed with the plan and demonstrated an understanding of the instructions.   The patient was advised to call back or seek an in-person evaluation if the symptoms worsen or if the condition fails to improve as anticipated.   I provided 21+ minutes of time during this  encounter.  Follow up plan: Return if symptoms worsen or fail to improve.

## 2020-05-04 NOTE — Assessment & Plan Note (Signed)
Acute, recent treatment with Amoxicillin -- then recent exposure to Covid positive patient last week with return of worsening symptoms (sinus pressure, rhinorrhea, sore throat).  Initial Covid test negative, but coworker was positive with similar symptoms.  She is retesting today as recommended.  At this time recommend continue OTC medications for symptoms relief + script for Flonase sent in.  Recommend she continue her daily Vitamin D + start taking Zinc and Vitamin C daily.  She will alert provider to Covid results and aware treatment will continue with OTC regimen due to length of time with symptoms.  Increase rest and hydration at home.  Self quarantine until results return, if positive 10 day quarantine recommended.  Follow-up as needed or for worsening.

## 2020-05-06 ENCOUNTER — Other Ambulatory Visit: Payer: Self-pay | Admitting: Nurse Practitioner

## 2020-05-06 MED ORDER — AZITHROMYCIN 250 MG PO TABS
ORAL_TABLET | ORAL | 0 refills | Status: DC
Start: 2020-05-06 — End: 2020-07-22

## 2020-05-06 MED ORDER — MONTELUKAST SODIUM 10 MG PO TABS: 10.0000 mg | ORAL_TABLET | Freq: Every day | ORAL | 3 refills | Status: DC

## 2020-05-12 ENCOUNTER — Other Ambulatory Visit: Payer: Self-pay | Admitting: Nurse Practitioner

## 2020-05-12 MED ORDER — PREDNISONE 20 MG PO TABS: 40.0000 mg | ORAL_TABLET | Freq: Every day | ORAL | 0 refills | Status: AC

## 2020-06-08 ENCOUNTER — Other Ambulatory Visit: Payer: Self-pay | Admitting: Nurse Practitioner

## 2020-06-08 NOTE — Telephone Encounter (Signed)
Requested medications are due for refill today? Yes  Requested medications are on active medication list?  Yes  Last Refill:   02/07/2020  # 30 with three refills   Future visit scheduled?  Yes  Notes to Clinic:  Please see message below.    Why Am I Seeing These Alternatives?   traZODone (DESYREL) 50 MG tablet [Pharmacy Med Name: TRAZODONE 50MG  TABLETS] is not on the preferred formulary for the patient's insurance plan. Below are alternatives which are likely to be more affordable. Do not assume that every medication presented is a clinically appropriate alternative.  These alternatives are medications that are in the same pharmaceutical subclass (Serotonin Modulators) as the ordered medication and are on formulary for the patient's insurance plan.

## 2020-06-28 ENCOUNTER — Other Ambulatory Visit: Payer: Self-pay | Admitting: Nurse Practitioner

## 2020-06-28 MED ORDER — CITALOPRAM HYDROBROMIDE 40 MG PO TABS
40.0000 mg | ORAL_TABLET | Freq: Every day | ORAL | 0 refills | Status: DC
Start: 2020-06-28 — End: 2020-07-22

## 2020-06-28 NOTE — Telephone Encounter (Signed)
Medication: citalopram (CELEXA) 40 MG tablet [[id number]] Apt 07/22/20  Has the patient contacted their pharmacy? YES (Agent: If no, request that the patient contact the pharmacy for the refill.) (Agent: If yes, when and what did the pharmacy advise?)  Preferred Pharmacy (with phone number or street name): [facility] [place], [address] [place] [address] Phone: [phone] Fax: [phone] Hours: Not open 24 hours    Agent: Please be advised that RX refills may take up to 3 business days. We ask that you follow-up with your pharmacy.

## 2020-06-29 ENCOUNTER — Encounter: Payer: 59 | Admitting: Nurse Practitioner

## 2020-07-14 ENCOUNTER — Other Ambulatory Visit: Payer: Self-pay | Admitting: Family Medicine

## 2020-07-14 NOTE — Telephone Encounter (Signed)
Requested Prescriptions  Pending Prescriptions Disp Refills   traZODone (DESYREL) 50 MG tablet [Pharmacy Med Name: TRAZODONE 50MG  TABLETS] 30 tablet 0    Sig: TAKE 1/2 TO 1 TABLET(25 TO 50 MG) BY MOUTH AT BEDTIME AS NEEDED FOR SLEEP     Psychiatry: Antidepressants - Serotonin Modulator Passed - 07/14/2020  3:13 AM      Passed - Valid encounter within last 6 months    Recent Outpatient Visits          2 months ago Sinus pressure   [facility], [place] T, NP   6 months ago UTI symptoms   [facility] [place], [place] T, NP   6 months ago Generalized anxiety disorder   [facility], Barbaraann Faster, NP   10 months ago Situational anxiety   [place], [place] T, NP   11 months ago Vitamin D deficiency   [place], Barbaraann Faster, NP      Future Appointments            In 1 week Cannady, Barbaraann Faster, NP [employer]

## 2020-07-22 ENCOUNTER — Encounter: Payer: Self-pay | Admitting: Nurse Practitioner

## 2020-07-22 ENCOUNTER — Ambulatory Visit (INDEPENDENT_AMBULATORY_CARE_PROVIDER_SITE_OTHER): Payer: 59 | Admitting: Nurse Practitioner

## 2020-07-22 ENCOUNTER — Other Ambulatory Visit: Payer: Self-pay

## 2020-07-22 VITALS — BP 108/63 | HR 67 | Temp 98.3°F | Ht 61.0 in | Wt 165.2 lb

## 2020-07-22 DIAGNOSIS — F411 Generalized anxiety disorder: Secondary | ICD-10-CM | POA: Diagnosis not present

## 2020-07-22 DIAGNOSIS — N951 Menopausal and female climacteric states: Secondary | ICD-10-CM

## 2020-07-22 DIAGNOSIS — Z6832 Body mass index (BMI) 32.0-32.9, adult: Secondary | ICD-10-CM

## 2020-07-22 DIAGNOSIS — E559 Vitamin D deficiency, unspecified: Secondary | ICD-10-CM

## 2020-07-22 DIAGNOSIS — E7800 Pure hypercholesterolemia, unspecified: Secondary | ICD-10-CM | POA: Diagnosis not present

## 2020-07-22 DIAGNOSIS — Z1329 Encounter for screening for other suspected endocrine disorder: Secondary | ICD-10-CM

## 2020-07-22 DIAGNOSIS — E6609 Other obesity due to excess calories: Secondary | ICD-10-CM

## 2020-07-22 DIAGNOSIS — Z0000 Encounter for general adult medical examination without abnormal findings: Secondary | ICD-10-CM | POA: Diagnosis not present

## 2020-07-22 DIAGNOSIS — Z1231 Encounter for screening mammogram for malignant neoplasm of breast: Secondary | ICD-10-CM

## 2020-07-22 DIAGNOSIS — Z23 Encounter for immunization: Secondary | ICD-10-CM | POA: Diagnosis not present

## 2020-07-22 DIAGNOSIS — J301 Allergic rhinitis due to pollen: Secondary | ICD-10-CM

## 2020-07-22 DIAGNOSIS — N3281 Overactive bladder: Secondary | ICD-10-CM

## 2020-07-22 DIAGNOSIS — E66811 Obesity, class 1: Secondary | ICD-10-CM

## 2020-07-22 MED ORDER — CITALOPRAM HYDROBROMIDE 40 MG PO TABS
40.0000 mg | ORAL_TABLET | Freq: Every day | ORAL | 4 refills | Status: DC
Start: 2020-07-22 — End: 2021-04-14

## 2020-07-22 MED ORDER — MONTELUKAST SODIUM 10 MG PO TABS
10.0000 mg | ORAL_TABLET | Freq: Every day | ORAL | 4 refills | Status: DC
Start: 2020-07-22 — End: 2022-05-02

## 2020-07-22 MED ORDER — GABAPENTIN 300 MG PO CAPS
ORAL_CAPSULE | ORAL | 4 refills | Status: DC
Start: 2020-07-22 — End: 2021-04-14

## 2020-07-22 MED ORDER — OXYBUTYNIN CHLORIDE ER 5 MG PO TB24
ORAL_TABLET | ORAL | 4 refills | Status: DC
Start: 2020-07-22 — End: 2021-01-17

## 2020-07-22 MED ORDER — TRAZODONE HCL 50 MG PO TABS
ORAL_TABLET | ORAL | 4 refills | Status: DC
Start: 2020-07-22 — End: 2021-03-21

## 2020-07-22 MED ORDER — FLUTICASONE PROPIONATE 50 MCG/ACT NA SUSP
2.0000 | Freq: Every day | NASAL | 6 refills | Status: DC
Start: 2020-07-22 — End: 2022-08-08

## 2020-07-22 NOTE — Assessment & Plan Note (Signed)
Chronic, stable.  Continue current medication regimen and adjust as needed based on symptoms.

## 2020-07-22 NOTE — Patient Instructions (Signed)
Healthy Eating Following a healthy eating pattern may help you to achieve and maintain a healthy body weight, reduce the risk of chronic disease, and live a long and productive life. It is important to follow a healthy eating pattern at an appropriate calorie level for your body. Your nutritional needs should be met primarily through food by choosing a variety of nutrient-rich foods. What are tips for following this plan? Reading food labels  Read labels and choose the following: ? Reduced or low sodium. ? Juices with 100% fruit juice. ? Foods with low saturated fats and high polyunsaturated and monounsaturated fats. ? Foods with whole grains, such as whole wheat, cracked wheat, brown rice, and wild rice. ? Whole grains that are fortified with folic acid. This is recommended for women who are pregnant or who want to become pregnant.  Read labels and avoid the following: ? Foods with a lot of added sugars. These include foods that contain brown sugar, corn sweetener, corn syrup, dextrose, fructose, glucose, high-fructose corn syrup, honey, invert sugar, lactose, malt syrup, maltose, molasses, raw sugar, sucrose, trehalose, or turbinado sugar.  Do not eat more than the following amounts of added sugar per day:  6 teaspoons (25 g) for women.  9 teaspoons (38 g) for men. ? Foods that contain processed or refined starches and grains. ? Refined grain products, such as white flour, degermed cornmeal, white bread, and white rice. Shopping  Choose nutrient-rich snacks, such as vegetables, whole fruits, and nuts. Avoid high-calorie and high-sugar snacks, such as potato chips, fruit snacks, and candy.  Use oil-based dressings and spreads on foods instead of solid fats such as butter, stick margarine, or cream cheese.  Limit pre-made sauces, mixes, and "instant" products such as flavored rice, instant noodles, and ready-made pasta.  Try more plant-protein sources, such as tofu, tempeh, black beans,  edamame, lentils, nuts, and seeds.  Explore eating plans such as the Mediterranean diet or vegetarian diet. Cooking  Use oil to saut or stir-fry foods instead of solid fats such as butter, stick margarine, or lard.  Try baking, boiling, grilling, or broiling instead of frying.  Remove the fatty part of meats before cooking.  Steam vegetables in water or broth. Meal planning   At meals, imagine dividing your plate into fourths: ? One-half of your plate is fruits and vegetables. ? One-fourth of your plate is whole grains. ? One-fourth of your plate is protein, especially lean meats, poultry, eggs, tofu, beans, or nuts.  Include low-fat dairy as part of your daily diet. Lifestyle  Choose healthy options in all settings, including home, work, school, restaurants, or stores.  Prepare your food safely: ? Wash your hands after handling raw meats. ? Keep food preparation surfaces clean by regularly washing with hot, soapy water. ? Keep raw meats separate from ready-to-eat foods, such as fruits and vegetables. ? Cook seafood, meat, poultry, and eggs to the recommended internal temperature. ? Store foods at safe temperatures. In general:  Keep cold foods at 59F (4.4C) or below.  Keep hot foods at 159F (60C) or above.  Keep your freezer at [facility] (-17.8C) or below.  Foods are no longer safe to eat when they have been between the temperatures of 40-159F (4.4-60C) for more than 2 hours. What foods should I eat? Fruits Aim to eat 2 cup-equivalents of fresh, canned (in natural juice), or frozen fruits each day. Examples of 1 cup-equivalent of fruit include 1 small apple, 8 large strawberries, 1 cup canned fruit,  cup  dried fruit, or 1 cup 100% juice. Vegetables Aim to eat 2-3 cup-equivalents of fresh and frozen vegetables each day, including different varieties and colors. Examples of 1 cup-equivalent of vegetables include 2 medium carrots, 2 cups raw, leafy greens, 1 cup chopped  vegetable (raw or cooked), or 1 medium baked potato. Grains Aim to eat 6 ounce-equivalents of whole grains each day. Examples of 1 ounce-equivalent of grains include 1 slice of bread, 1 cup ready-to-eat cereal, 3 cups popcorn, or  cup cooked rice, pasta, or cereal. Meats and other proteins Aim to eat 5-6 ounce-equivalents of protein each day. Examples of 1 ounce-equivalent of protein include 1 egg, 1/2 cup nuts or seeds, or 1 tablespoon (16 g) peanut butter. A cut of meat or fish that is the size of a deck of cards is about 3-4 ounce-equivalents.  Of the protein you eat each week, try to have at least 8 ounces come from seafood. This includes salmon, trout, herring, and anchovies. Dairy Aim to eat 3 cup-equivalents of fat-free or low-fat dairy each day. Examples of 1 cup-equivalent of dairy include 1 cup (240 mL) milk, 8 ounces (250 g) yogurt, 1 ounces (44 g) natural cheese, or 1 cup (240 mL) fortified soy milk. Fats and oils  Aim for about 5 teaspoons (21 g) per day. Choose monounsaturated fats, such as canola and olive oils, avocados, peanut butter, and most nuts, or polyunsaturated fats, such as sunflower, corn, and soybean oils, walnuts, pine nuts, sesame seeds, sunflower seeds, and flaxseed. Beverages  Aim for six 8-oz glasses of water per day. Limit coffee to three to five 8-oz cups per day.  Limit caffeinated beverages that have added calories, such as soda and energy drinks.  Limit alcohol intake to no more than 1 drink a day for nonpregnant women and 2 drinks a day for men. One drink equals 12 oz of beer (355 mL), 5 oz of wine (148 mL), or 1 oz of hard liquor (44 mL). Seasoning and other foods  Avoid adding excess amounts of salt to your foods. Try flavoring foods with herbs and spices instead of salt.  Avoid adding sugar to foods.  Try using oil-based dressings, sauces, and spreads instead of solid fats. This information is based on general [place] nutrition guidelines. For more  information, visit [employer]. Exact amounts may vary based on your nutrition needs. Summary  A healthy eating plan may help you to maintain a healthy weight, reduce the risk of chronic diseases, and stay active throughout your life.  Plan your meals. Make sure you eat the right portions of a variety of nutrient-rich foods.  Try baking, boiling, grilling, or broiling instead of frying.  Choose healthy options in all settings, including home, work, school, restaurants, or stores. This information is not intended to replace advice given to you by your health care provider. Make sure you discuss any questions you have with your health care provider. Document Revised: 12/10/2017 Document Reviewed: 12/10/2017 Elsevier Patient Education  [place].

## 2020-07-22 NOTE — Assessment & Plan Note (Signed)
Chronic, stable.  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  Denies SI/HI.  Continue Citalopram 40 MG daily, which benefits both mood and menopausal symptoms, plus Trazodone PRN 25-50 MG at HS for short period for sleep needs.  Discussed various methods of meditation and relaxation techniques at home.  Return in 6 months.

## 2020-07-22 NOTE — Assessment & Plan Note (Signed)
Chronic, ongoing.  ASCVD last 2.9%.  Repeat lipid panel today and focus on diet.  Fasting.

## 2020-07-22 NOTE — Assessment & Plan Note (Signed)
Recommended eating smaller high protein, low fat meals more frequently and exercising 30 mins a day 5 times a week with a goal of 10-15lb weight loss in the next 3 months. Patient voiced their understanding and motivation to adhere to these recommendations.  

## 2020-07-22 NOTE — Assessment & Plan Note (Signed)
Chronic, ongoing.  Continue supplement and recheck Vitamin D level today.  May consider return to Vit D weekly if on lower side. 

## 2020-07-22 NOTE — Assessment & Plan Note (Signed)
Chronic, ongoing with no improvement even on max amount of daily medications.  Will placed referral to ENT for further assessment and allergy testing.  She agrees with this plan.

## 2020-07-22 NOTE — Progress Notes (Signed)
BP 108/63   Pulse 67   Temp 98.3 F (36.8 C)   Ht 5\' 1"  (1.549 m)   Wt 165 lb 3.2 oz (74.9 kg)   SpO2 96%   BMI 31.21 kg/m    Subjective:    Patient ID: Karen Lambert, female    DOB: 09/27/1960, 59 y.o.   MRN: [id number]  HPI: Karen Lambert is a 59 y.o. female presenting on 07/22/2020 for comprehensive medical examination. Current medical complaints include:none  She currently lives with: mother Menopausal Symptoms: no   Continues on Ditropan XL for OAB, with benefit.  Taking Singulair, Claritin, Flonase for her allergies -- but continues to have drainage.    HYPERLIPIDEMIA No current medications, diet focused.  LDL 166 and TCHOL 285 at last visit in September.  She is working on diet, cutting back on alcohol and eating healthy foods. Hyperlipidemia status: good compliance Supplements: none Aspirin:  no The 10-year ASCVD risk score Mikey Bussing DC Jr., et al., 2013) is: 2.9%   Values used to calculate the score:     Age: 6 years     Sex: Female     Is Non-Hispanic African American: No     Diabetic: No     Tobacco smoker: No     Systolic Blood Pressure: 341 mmHg     Is BP treated: No     HDL Cholesterol: 37 mg/dL     Total Cholesterol: 225 mg/dL Chest pain:  no Coronary artery disease:  no Family history CAD:  no Family history early CAD:  no   VITAMIN D DEFICIENCY: Continues on daily supplement.  No recent fractures or falls.  Last level was 52.1 in January.  She reports feeling like her Vitamin D is low again because when it is low she gets fatigued.  ANXIETY/STRESS Continues on Celexa 40 MG and Trazodone 50 MG at night.  Previously has taken Zoloft 50 MG.  She lives with her mother who she cares for, diagnosed with dementia.   Duration:stable Anxious mood: yes -- brother just passed Excessive worrying: yes -- brother just passed Irritability: no  Sweating: no Nausea: no Palpitations:no Hyperventilation: no Panic attacks: no Agoraphobia: no   Obscessions/compulsions: no Depressed mood: no Depression screen [facility] 2/9 07/22/2020 12/26/2019 11/27/2018 11/13/2018  Decreased Interest 3 1 0 1  Down, Depressed, Hopeless 2 1 0 1  PHQ - 2 Score 5 2 0 2  Altered sleeping 2 2 1 2   Tired, decreased energy 1 3 2 2   Change in appetite 1 1 1 2   Feeling bad or failure about yourself  0 0 0 1  Trouble concentrating 1 0 1 1  Moving slowly or fidgety/restless 0 0 0 0  Suicidal thoughts 0 0 0 0  PHQ-9 Score 10 8 5 10   Difficult doing work/chores Somewhat difficult Somewhat difficult Not difficult at all Somewhat difficult  Anhedonia: no Weight changes: no Insomnia: yes hard to stay asleep  Hypersomnia: no Fatigue/loss of energy: no Feelings of worthlessness: no Feelings of guilt: no Impaired concentration/indecisiveness: no Suicidal ideations: no  Crying spells: no Recent Stressors/Life Changes: yes   Relationship problems: no   Family stress: yes     Financial stress: no    Job stress: no    Recent death/loss: no  The patient does not have a history of falls. I did not complete a risk assessment for falls. A plan of care for falls was not documented.   Past Medical History:  Past Medical History:  Diagnosis Date  . Anxiety   . Broken ankle   . Hyperlipidemia   . Skin cancer 2012    Surgical History:  Past Surgical History:  Procedure Laterality Date  . ABDOMINAL HYSTERECTOMY  2010  . BASAL CELL CARCINOMA EXCISION     L shoulder and R upper thigh  . CESAREAN SECTION    . COLONOSCOPY WITH PROPOFOL N/A 12/20/2015   Procedure: COLONOSCOPY WITH PROPOFOL;  Surgeon: Hulen Luster, MD;  Location: [facility] ENDOSCOPY;  Service: Gastroenterology;  Laterality: N/A;    Medications:  Current Outpatient Medications on File Prior to Visit  Medication Sig  . loratadine (CLARITIN) 10 MG tablet Take 10 mg by mouth daily.  Marland Kitchen MELATONIN PO Take by mouth daily. 3 mg at bed time  . VITAMIN D PO Take by mouth daily. 1000 mg  . XIIDRA 5 % SOLN Place 1  drop into both eyes 2 (two) times daily.   No current facility-administered medications on file prior to visit.    Allergies:  Allergies  Allergen Reactions  . Latex Rash and Shortness Of Breath    Social History:  Social History   Socioeconomic History  . Marital status: Divorced    Spouse name: Not on file  . Number of children: Not on file  . Years of education: Not on file  . Highest education level: Not on file  Occupational History  . Not on file  Tobacco Use  . Smoking status: Former Research scientist (life sciences)  . Smokeless tobacco: Never Used  Vaping Use  . Vaping Use: Never used  Substance and Sexual Activity  . Alcohol use: Yes    Alcohol/week: 1.0 standard drink    Types: 1 Glasses of wine per week  . Drug use: Not Currently  . Sexual activity: Not Currently  Other Topics Concern  . Not on file  Social History Narrative  . Not on file   Social Determinants of Health   Financial Resource Strain:   . Difficulty of Paying Living Expenses: Not on file  Food Insecurity:   . Worried About Charity fundraiser in the Last Year: Not on file  . Ran Out of Food in the Last Year: Not on file  Transportation Needs:   . Lack of Transportation (Medical): Not on file  . Lack of Transportation (Non-Medical): Not on file  Physical Activity:   . Days of Exercise per Week: Not on file  . Minutes of Exercise per Session: Not on file  Stress:   . Feeling of Stress : Not on file  Social Connections:   . Frequency of Communication with Friends and Family: Not on file  . Frequency of Social Gatherings with Friends and Family: Not on file  . Attends Religious Services: Not on file  . Active Member of Clubs or Organizations: Not on file  . Attends Archivist Meetings: Not on file  . Marital Status: Not on file  Intimate Partner Violence:   . Fear of Current or Ex-Partner: Not on file  . Emotionally Abused: Not on file  . Physically Abused: Not on file  . Sexually Abused: Not on  file   Social History   Tobacco Use  Smoking Status Former Smoker  Smokeless Tobacco Never Used   Social History   Substance and Sexual Activity  Alcohol Use Yes  . Alcohol/week: 1.0 standard drink  . Types: 1 Glasses of wine per week    Family History:  Family History  Problem Relation Age of  Onset  . Heart disease Mother   . Diabetes Mother   . Cataracts Mother   . Cancer Father   . Diabetes Father   . Stroke Father   . Lymphoma Brother   . Stroke Maternal Grandfather   . Breast cancer Paternal Aunt     Past medical history, surgical history, medications, allergies, family history and social history reviewed with patient today and changes made to appropriate areas of the chart.   Review of Systems - negative All other ROS negative except what is listed above and in the HPI.      Objective:    BP 108/63   Pulse 67   Temp 98.3 F (36.8 C)   Ht 5\' 1"  (1.549 m)   Wt 165 lb 3.2 oz (74.9 kg)   SpO2 96%   BMI 31.21 kg/m   Wt Readings from Last 3 Encounters:  07/22/20 165 lb 3.2 oz (74.9 kg)  12/26/19 165 lb (74.8 kg)  07/25/19 159 lb (72.1 kg)    Physical Exam Vitals and nursing note reviewed.  Constitutional:      General: She is awake. She is not in acute distress.    Appearance: She is well-developed. She is not ill-appearing.  HENT:     Head: Normocephalic and atraumatic.     Right Ear: Hearing, tympanic membrane, ear canal and external ear normal. No drainage.     Left Ear: Hearing, tympanic membrane, ear canal and external ear normal. No drainage.     Nose: Nose normal.     Right Sinus: No maxillary sinus tenderness or frontal sinus tenderness.     Left Sinus: No maxillary sinus tenderness or frontal sinus tenderness.     Mouth/Throat:     Mouth: Mucous membranes are moist.     Pharynx: Oropharynx is clear. Uvula midline. No pharyngeal swelling, oropharyngeal exudate or posterior oropharyngeal erythema.  Eyes:     General: Lids are normal.         Right eye: No discharge.        Left eye: No discharge.     Extraocular Movements: Extraocular movements intact.     Conjunctiva/sclera: Conjunctivae normal.     Pupils: Pupils are equal, round, and reactive to light.     Visual Fields: Right eye visual fields normal and left eye visual fields normal.  Neck:     Thyroid: No thyromegaly.     Vascular: No carotid bruit.     Trachea: Trachea normal.  Cardiovascular:     Rate and Rhythm: Normal rate and regular rhythm.     Heart sounds: Normal heart sounds. No murmur heard.  No gallop.   Pulmonary:     Effort: Pulmonary effort is normal. No accessory muscle usage or respiratory distress.     Breath sounds: Normal breath sounds.  Chest:     Comments: She declined breast exam today. Abdominal:     General: Bowel sounds are normal.     Palpations: Abdomen is soft. There is no hepatomegaly or splenomegaly.     Tenderness: There is no abdominal tenderness.  Musculoskeletal:        General: Normal range of motion.     Cervical back: Normal range of motion and neck supple.     Right lower leg: No edema.     Left lower leg: No edema.  Lymphadenopathy:     Head:     Right side of head: No submental, submandibular, tonsillar, preauricular or posterior auricular adenopathy.  Left side of head: No submental, submandibular, tonsillar, preauricular or posterior auricular adenopathy.     Cervical: No cervical adenopathy.  Skin:    General: Skin is warm and dry.     Capillary Refill: Capillary refill takes less than 2 seconds.     Findings: No rash.  Neurological:     Mental Status: She is alert and oriented to person, place, and time.     Cranial Nerves: Cranial nerves are intact.     Gait: Gait is intact.     Deep Tendon Reflexes: Reflexes are normal and symmetric.     Reflex Scores:      Brachioradialis reflexes are 2+ on the right side and 2+ on the left side.      Patellar reflexes are 2+ on the right side and 2+ on the left  side. Psychiatric:        Attention and Perception: Attention normal.        Mood and Affect: Mood normal.        Speech: Speech normal.        Behavior: Behavior normal. Behavior is cooperative.        Thought Content: Thought content normal.        Judgment: Judgment normal.    Results for orders placed or performed in visit on 01/02/20  Microscopic Examination   URINE  Result Value Ref Range   WBC, UA 0-5 0 - 5 /hpf   RBC None seen 0 - 2 /hpf   Epithelial Cells (non renal) 0-10 0 - 10 /hpf   Casts Present None seen /lpf   Cast Type Hyaline casts N/A   Bacteria, UA Few (A) None seen/Few  Urine Culture, Reflex   URINE  Result Value Ref Range   Urine Culture, Routine Final report    Organism ID, Bacteria Comment   UA/M w/rflx Culture, Routine   Specimen: Urine   URINE  Result Value Ref Range   Color, UA Orange Yellow   Appearance Ur Clear Clear   Microscopic Examination See below:    Urinalysis Reflex Comment       Assessment & Plan:   Problem List Items Addressed This Visit      Cardiovascular and Mediastinum   Hot flashes due to menopause    Ongoing, stable.  Continue Celexa and Gabapentin.  Continue Melatonin or Trazodone as needed for sleep.  Plan on follow-up in 6 months or sooner if worsening symptoms present,        Respiratory   Seasonal allergic rhinitis due to pollen    Chronic, ongoing with no improvement even on max amount of daily medications.  Will placed referral to ENT for further assessment and allergy testing.  She agrees with this plan.      Relevant Orders   Ambulatory referral to ENT     Genitourinary   OAB (overactive bladder)    Chronic, stable.  Continue current medication regimen and adjust as needed based on symptoms.          Other   Generalized anxiety disorder    Chronic, stable.  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  Denies SI/HI.  Continue Citalopram 40 MG daily, which benefits both mood and menopausal symptoms, plus Trazodone PRN 25-50  MG at HS for short period for sleep needs.  Discussed various methods of meditation and relaxation techniques at home.  Return in 6 months.      Relevant Medications   traZODone (DESYREL) 50 MG tablet   citalopram (CELEXA)  40 MG tablet   Pure hypercholesterolemia - Primary    Chronic, ongoing.  ASCVD last 2.9%.  Repeat lipid panel today and focus on diet.  Fasting.      Relevant Orders   Lipid Panel w/o Chol/HDL Ratio   Vitamin D deficiency    Chronic, ongoing.  Continue supplement and recheck Vitamin D level today.  May consider return to Vit D weekly if on lower side.      Relevant Orders   VITAMIN D 25 Hydroxy (Vit-D Deficiency, Fractures)   Obesity    Recommended eating smaller high protein, low fat meals more frequently and exercising 30 mins a day 5 times a week with a goal of 10-15lb weight loss in the next 3 months. Patient voiced their understanding and motivation to adhere to these recommendations.        Other Visit Diagnoses    Annual physical exam       Relevant Orders   CBC with Differential/Platelet   Comprehensive metabolic panel   Thyroid disorder screen       Relevant Orders   TSH   Encounter for screening mammogram for malignant neoplasm of breast       Relevant Orders   MM DIGITAL SCREENING BILATERAL   Need for influenza vaccination       Relevant Orders   Flu Vaccine QUAD 6+ mos PF IM (Fluarix Quad PF)       Follow up plan: Return in about 6 months (around 01/19/2021) for HLD, ANXIETY, OAB.   LABORATORY TESTING:  - Pap smear: not applicable  IMMUNIZATIONS:   - Tdap: Tetanus vaccination status reviewed: last tetanus booster within 10 years. - Influenza: Administered today - Pneumovax: Not applicable - Prevnar: Not applicable - HPV: Not applicable - Zostavax vaccine: Up to date  SCREENING: -Mammogram: Ordered today - Colonoscopy: Up to date  - Bone Density: Not applicable  -Hearing Test: Not applicable  -Spirometry: Not applicable    PATIENT COUNSELING:   Advised to take 1 mg of folate supplement per day if capable of pregnancy.   Sexuality: Discussed sexually transmitted diseases, partner selection, use of condoms, avoidance of unintended pregnancy  and contraceptive alternatives.   Advised to avoid cigarette smoking.  I discussed with the patient that most people either abstain from alcohol or drink within safe limits (<=14/week and <=4 drinks/occasion for males, <=7/weeks and <= 3 drinks/occasion for females) and that the risk for alcohol disorders and other health effects rises proportionally with the number of drinks per week and how often a drinker exceeds daily limits.  Discussed cessation/primary prevention of drug use and availability of treatment for abuse.   Diet: Encouraged to adjust caloric intake to maintain  or achieve ideal body weight, to reduce intake of dietary saturated fat and total fat, to limit sodium intake by avoiding high sodium foods and not adding table salt, and to maintain adequate dietary potassium and calcium preferably from fresh fruits, vegetables, and low-fat dairy products.    stressed the importance of regular exercise  Injury prevention: Discussed safety belts, safety helmets, smoke detector, smoking near bedding or upholstery.   Dental health: Discussed importance of regular tooth brushing, flossing, and dental visits.    NEXT PREVENTATIVE PHYSICAL DUE IN 1 YEAR. Return in about 6 months (around 01/19/2021) for HLD, ANXIETY, OAB.

## 2020-07-22 NOTE — Assessment & Plan Note (Signed)
Ongoing, stable.  Continue Celexa and Gabapentin.  Continue Melatonin or Trazodone as needed for sleep.  Plan on follow-up in 6 months or sooner if worsening symptoms present,

## 2020-07-23 LAB — COMPREHENSIVE METABOLIC PANEL
ALT: 33 IU/L — ABNORMAL HIGH (ref 0–32)
AST: 27 IU/L (ref 0–40)
Albumin/Globulin Ratio: 2.4 — ABNORMAL HIGH (ref 1.2–2.2)
Albumin: 4.7 g/dL (ref 3.8–4.9)
Alkaline Phosphatase: 112 IU/L (ref 44–121)
BUN/Creatinine Ratio: 13 (ref 9–23)
BUN: 13 mg/dL (ref 6–24)
Bilirubin Total: 0.3 mg/dL (ref 0.0–1.2)
CO2: 21 mmol/L (ref 20–29)
Calcium: 9.4 mg/dL (ref 8.7–10.2)
Chloride: 102 mmol/L (ref 96–106)
Creatinine, Ser: 0.99 mg/dL (ref 0.57–1.00)
GFR calc Af Amer: 73 mL/min/{1.73_m2} (ref >59)
GFR calc non Af Amer: 63 mL/min/{1.73_m2} (ref >59)
Globulin, Total: 2 g/dL (ref 1.5–4.5)
Glucose: 119 mg/dL — ABNORMAL HIGH (ref 65–99)
Potassium: 4.5 mmol/L (ref 3.5–5.2)
Sodium: 139 mmol/L (ref 134–144)
Total Protein: 6.7 g/dL (ref 6.0–8.5)

## 2020-07-23 LAB — CBC WITH DIFFERENTIAL/PLATELET
Basophils Absolute: 0.1 10*3/uL (ref 0.0–0.2)
Basos: 1 %
EOS (ABSOLUTE): 0.1 10*3/uL (ref 0.0–0.4)
Eos: 2 %
Hematocrit: 38.6 % (ref 34.0–46.6)
Hemoglobin: 13.5 g/dL (ref 11.1–15.9)
Immature Grans (Abs): 0 10*3/uL (ref 0.0–0.1)
Immature Granulocytes: 1 %
Lymphocytes Absolute: 1.5 10*3/uL (ref 0.7–3.1)
Lymphs: 24 %
MCH: 31.7 pg (ref 26.6–33.0)
MCHC: 35 g/dL (ref 31.5–35.7)
MCV: 91 fL (ref 79–97)
Monocytes Absolute: 0.4 10*3/uL (ref 0.1–0.9)
Monocytes: 7 %
Neutrophils Absolute: 4 10*3/uL (ref 1.4–7.0)
Neutrophils: 65 %
Platelets: 152 10*3/uL (ref 150–450)
RBC: 4.26 x10E6/uL (ref 3.77–5.28)
RDW: 12.5 % (ref 11.7–15.4)
WBC: 6.1 10*3/uL (ref 3.4–10.8)

## 2020-07-23 LAB — LIPID PANEL W/O CHOL/HDL RATIO
Cholesterol, Total: 237 mg/dL — ABNORMAL HIGH (ref 100–199)
HDL: 41 mg/dL (ref >39)
LDL Chol Calc (NIH): 142 mg/dL — ABNORMAL HIGH (ref 0–99)
Triglycerides: 297 mg/dL — ABNORMAL HIGH (ref 0–149)
VLDL Cholesterol Cal: 54 mg/dL — ABNORMAL HIGH (ref 5–40)

## 2020-07-23 LAB — VITAMIN D 25 HYDROXY (VIT D DEFICIENCY, FRACTURES): Vit D, 25-Hydroxy: 32.8 ng/mL (ref 30.0–100.0)

## 2020-07-23 LAB — TSH: TSH: 2.49 u[IU]/mL (ref 0.450–4.500)

## 2020-07-23 NOTE — Progress Notes (Signed)
Contacted via MyChart The 10-year ASCVD risk score Karen Lambert., et al., 2013) is: 2.8%   Values used to calculate the score:     Age: 58 years     Sex: Female     Is Non-Hispanic African American: No     Diabetic: No     Tobacco smoker: No     Systolic Blood Pressure: 637 mmHg     Is BP treated: No     HDL Cholesterol: 41 mg/dL     Total Cholesterol: 237 mg/dL  Good evening Karen Lambert, your labs have returned.  Overall they look good with some exceptions: - Glucose mildly elevated, next visit we will check this again, along with an A1C to ensure no diabetes. Focus on diet changes and regular activity. - Your cholesterol is still high, but continued recommendations to make lifestyle changes. Your LDL is above normal. The LDL is the bad cholesterol. Over time and in combination with inflammation and other factors, this contributes to plaque which in turn may lead to stroke and/or heart attack down the road. Sometimes high LDL is primarily genetic, and people might be eating all the right foods but still have high numbers. Other times, there is room for improvement in one's diet and eating healthier can bring this number down and potentially reduce one's risk of heart attack and/or stroke.   To reduce your LDL, Remember - more fruits and vegetables, more fish, and limit red meat and dairy products. More soy, nuts, beans, barley, lentils, oats and plant sterol ester enriched margarine instead of butter. I also encourage eliminating sugar and processed food. Remember, shop on the outside of the grocery store and visit your [employer]. If you would like to talk with me about dietary changes plus or minus medications for your cholesterol, please let me know. We should recheck your cholesterol in 6 months.  Any questions? Keep being awesome!!  Thank you for allowing me to participate in your care. Kindest regards, [name]

## 2020-07-27 ENCOUNTER — Other Ambulatory Visit: Payer: Self-pay | Admitting: Nurse Practitioner

## 2020-07-27 DIAGNOSIS — Z1231 Encounter for screening mammogram for malignant neoplasm of breast: Secondary | ICD-10-CM

## 2020-08-10 ENCOUNTER — Other Ambulatory Visit: Payer: Self-pay

## 2020-08-10 ENCOUNTER — Ambulatory Visit
Admission: RE | Admit: 2020-08-10 | Discharge: 2020-08-10 | Disposition: A | Discharge status: Home / Self Care | Payer: 59 | Source: Ambulatory Visit | Attending: Nurse Practitioner | Admitting: Nurse Practitioner

## 2020-08-10 DIAGNOSIS — Z1231 Encounter for screening mammogram for malignant neoplasm of breast: Secondary | ICD-10-CM

## 2020-09-12 ENCOUNTER — Other Ambulatory Visit: Payer: Self-pay | Admitting: Nurse Practitioner

## 2020-09-27 ENCOUNTER — Other Ambulatory Visit: Payer: 59

## 2020-10-09 ENCOUNTER — Other Ambulatory Visit: Payer: Self-pay | Admitting: Nurse Practitioner

## 2020-11-01 ENCOUNTER — Other Ambulatory Visit: Payer: 59

## 2020-11-03 ENCOUNTER — Ambulatory Visit: Admit: 2020-11-03 | Payer: 59 | Admitting: Otolaryngology

## 2020-11-03 SURGERY — SEPTOPLASTY, NOSE, WITH NASAL TURBINATE REDUCTION
Anesthesia: General | Laterality: Bilateral

## 2020-12-29 ENCOUNTER — Ambulatory Visit: Admit: 2020-12-29 | Payer: 59 | Admitting: Otolaryngology

## 2020-12-29 SURGERY — SEPTOPLASTY, NOSE, WITH NASAL TURBINATE REDUCTION
Anesthesia: General | Laterality: Bilateral

## 2021-01-15 ENCOUNTER — Encounter: Payer: Self-pay | Admitting: Nurse Practitioner

## 2021-01-15 DIAGNOSIS — R7301 Impaired fasting glucose: Secondary | ICD-10-CM | POA: Insufficient documentation

## 2021-01-15 DIAGNOSIS — E1165 Type 2 diabetes mellitus with hyperglycemia: Secondary | ICD-10-CM | POA: Insufficient documentation

## 2021-01-17 ENCOUNTER — Other Ambulatory Visit: Payer: Self-pay | Admitting: Nurse Practitioner

## 2021-01-17 MED ORDER — OXYBUTYNIN CHLORIDE ER 5 MG PO TB24: ORAL_TABLET | ORAL | 4 refills | Status: DC

## 2021-01-19 ENCOUNTER — Ambulatory Visit (INDEPENDENT_AMBULATORY_CARE_PROVIDER_SITE_OTHER): Payer: No Typology Code available for payment source | Admitting: Nurse Practitioner

## 2021-01-19 ENCOUNTER — Other Ambulatory Visit: Payer: Self-pay

## 2021-01-19 ENCOUNTER — Encounter: Payer: Self-pay | Admitting: Nurse Practitioner

## 2021-01-19 VITALS — BP 111/70 | HR 57 | Temp 98.3°F | Wt 166.4 lb

## 2021-01-19 DIAGNOSIS — N3281 Overactive bladder: Secondary | ICD-10-CM

## 2021-01-19 DIAGNOSIS — N951 Menopausal and female climacteric states: Secondary | ICD-10-CM

## 2021-01-19 DIAGNOSIS — R7301 Impaired fasting glucose: Secondary | ICD-10-CM

## 2021-01-19 DIAGNOSIS — F411 Generalized anxiety disorder: Secondary | ICD-10-CM | POA: Diagnosis not present

## 2021-01-19 DIAGNOSIS — E559 Vitamin D deficiency, unspecified: Secondary | ICD-10-CM

## 2021-01-19 DIAGNOSIS — Z6831 Body mass index (BMI) 31.0-31.9, adult: Secondary | ICD-10-CM

## 2021-01-19 DIAGNOSIS — N393 Stress incontinence (female) (male): Secondary | ICD-10-CM

## 2021-01-19 DIAGNOSIS — E7800 Pure hypercholesterolemia, unspecified: Secondary | ICD-10-CM

## 2021-01-19 DIAGNOSIS — E6609 Other obesity due to excess calories: Secondary | ICD-10-CM

## 2021-01-19 MED ORDER — BUSPIRONE HCL 5 MG PO TABS: 5.0000 mg | ORAL_TABLET | Freq: Two times a day (BID) | ORAL | 4 refills | Status: DC

## 2021-01-19 MED ORDER — OXYBUTYNIN CHLORIDE ER 10 MG PO TB24: 10.0000 mg | ORAL_TABLET | Freq: Every day | ORAL | 4 refills | Status: DC

## 2021-01-19 NOTE — Progress Notes (Signed)
BP 111/70 (BP Location: Left Arm, Cuff Size: Normal)   Pulse (!) 57   Temp 98.3 F (36.8 C) (Oral)   Wt 166 lb 6.4 oz (75.5 kg)   SpO2 96%   BMI 31.44 kg/m    Subjective:    Patient ID: Karen Lambert, female    DOB: 1961/03/17, 60 y.o.   MRN: [id number]  HPI: Karen Lambert is a 60 y.o. female  Chief Complaint  Patient presents with  . Anxiety  . Overactive Bladder    Patient she is having a new issue with her bladder. Patient states she is wetting the bed at night. Patient states spoke to her urologist a while ago, but not recently. Patient is asking if increasing her dosage would help with her current problem.   . Hyperlipidemia   HYPERLIPIDEMIA No current medications, diet focused. LDL 142 and TCHOL 237 TRIG 297 at last visit in November. She is working on diet, cutting back on alcohol and eating healthy foods.  Glucose on November labs 119. Hyperlipidemia status:good compliance Supplements:none Aspirin:no The 10-year ASCVD risk score Mikey Bussing DC Jr., et al., 2013) is: 3.2%   Values used to calculate the score:     Age: 2 years     Sex: Female     Is Non-Hispanic African American: No     Diabetic: No     Tobacco smoker: No     Systolic Blood Pressure: 035 mmHg     Is BP treated: No     HDL Cholesterol: 41 mg/dL     Total Cholesterol: 237 mg/dL Chest pain:no Coronary artery disease:no Family history KKX:[id number] father and her father had stroke Family history early CAD:no  OVERACTIVE BLADDER Currently taking Oxybutynin XL 5 MG every day.  Previously saw urology.  She is noticing more incidents of wetting bed at night -- 2 to 3 times a week.  Is using chucks at home.    Has one child -- had c-section.  When child she did wet bed often -- around 7 she was still wetting bed.  She does drink water often -- takes this with her to bed too.  No sodas or caffeine.  She does dribble a lot during daytime if coughs or sneezes or laughs hard.   Dysuria: no Urinary  frequency: yes Urgency: no Small volume voids: no Symptom severity: no Urinary incontinence: yes Foul odor: no Hematuria: no Abdominal pain: no Back pain: no Suprapubic pain/pressure: no Flank pain: no Fever:  no Status: better/worse/stable Previous urinary tract infection: yes Recurrent urinary tract infection: no Sexual activity: No sexually active Treatments attempted: Oxybutynin   VITAMIN D DEFICIENCY: Continues on daily supplement. No recent fractures or falls. Last level was 32.8 and NovemberShe reports feeling like her Vitamin D is low again because when it is low she gets fatigued.  ANXIETY/STRESS Continues on Celexa 40 MG and Trazodone 50 MG at night -- also takes Gabapentin for menopausal symptoms. Previously has taken Zoloft 50 MG. She lives with her mother who she cares for, diagnosed with dementia.   Duration:stable Anxious mood:yes Excessive worrying:yes Irritability:no Sweating:no Nausea:no Palpitations:no Hyperventilation:no Panic attacks:no Agoraphobia:no Obscessions/compulsions:no Depressed mood:no Depression screen [facility] 2/9 01/19/2021 07/22/2020 12/26/2019 11/27/2018 11/13/2018  Decreased Interest 2 3 1  0 1  Down, Depressed, Hopeless 2 2 1  0 1  PHQ - 2 Score 4 5 2  0 2  Altered sleeping 2 2 2 1 2   Tired, decreased energy 3 1 3 2 2   Change in appetite 3  1 1 1 2   Feeling bad or failure about yourself  3 0 0 0 1  Trouble concentrating 2 1 0 1 1  Moving slowly or fidgety/restless 2 0 0 0 0  Suicidal thoughts 0 0 0 0 0  PHQ-9 Score 19 10 8 5 10   Difficult doing work/chores - Somewhat difficult Somewhat difficult Not difficult at all Somewhat difficult  Anhedonia:no Weight changes:no Insomnia:yeshard to stay asleep-- Trazodone does help Hypersomnia:no Fatigue/loss of energy:no Feelings of worthlessness:no Feelings of guilt:no Impaired concentration/indecisiveness:no Suicidal ideations:no Crying spells:no Recent  Stressors/Life Changes:yes Relationship problems: no Family stress: yes Financial stress: no Job stress: no Recent death/loss: no GAD 7 : Generalized Anxiety Score 01/19/2021 12/26/2019 11/27/2018 11/13/2018  Nervous, Anxious, on Edge 3 3 1 1   Control/stop worrying 3 3 1 2   Worry too much - different things 3 3 1 2   Trouble relaxing 3 1 1 2   Restless 2 1 0 1  Easily annoyed or irritable 3 3 0 2  Afraid - awful might happen 3 1 0 0  Total GAD 7 Score 20 15 4 10   Anxiety Difficulty Somewhat difficult Somewhat difficult Not difficult at all Somewhat difficult    Relevant past medical, surgical, family and social history reviewed and updated as indicated. Interim medical history since our last visit reviewed. Allergies and medications reviewed and updated.  Review of Systems  Constitutional: Negative for activity change, appetite change, diaphoresis, fatigue and fever.  Respiratory: Negative for cough, chest tightness and shortness of breath.   Cardiovascular: Negative for chest pain, palpitations and leg swelling.  Gastrointestinal: Negative.   Genitourinary: Positive for frequency. Negative for decreased urine volume, dysuria, hematuria, pelvic pain and urgency.  Neurological: Negative.   Psychiatric/Behavioral: Positive for decreased concentration and sleep disturbance. Negative for self-injury and suicidal ideas. The patient is nervous/anxious.     Per HPI unless specifically indicated above     Objective:    BP 111/70 (BP Location: Left Arm, Cuff Size: Normal)   Pulse (!) 57   Temp 98.3 F (36.8 C) (Oral)   Wt 166 lb 6.4 oz (75.5 kg)   SpO2 96%   BMI 31.44 kg/m   Wt Readings from Last 3 Encounters:  01/19/21 166 lb 6.4 oz (75.5 kg)  07/22/20 165 lb 3.2 oz (74.9 kg)  12/26/19 165 lb (74.8 kg)    Physical Exam Vitals and nursing note reviewed.  Constitutional:      General: She is awake. She is not in acute distress.    Appearance: She is well-developed.  She is not ill-appearing.  HENT:     Head: Normocephalic.     Right Ear: Hearing normal.     Left Ear: Hearing normal.  Eyes:     General: Lids are normal.        Right eye: No discharge.        Left eye: No discharge.     Conjunctiva/sclera: Conjunctivae normal.     Pupils: Pupils are equal, round, and reactive to light.  Neck:     Vascular: No carotid bruit.  Cardiovascular:     Rate and Rhythm: Normal rate and regular rhythm.     Heart sounds: Normal heart sounds. No murmur heard. No gallop.   Pulmonary:     Effort: Pulmonary effort is normal. No accessory muscle usage or respiratory distress.     Breath sounds: Normal breath sounds.  Abdominal:     General: Bowel sounds are normal.     Palpations: Abdomen  is soft.  Musculoskeletal:     Cervical back: Normal range of motion and neck supple.     Right lower leg: No edema.     Left lower leg: No edema.  Skin:    General: Skin is warm and dry.  Neurological:     Mental Status: She is alert and oriented to person, place, and time.  Psychiatric:        Attention and Perception: Attention normal.        Mood and Affect: Mood normal.        Behavior: Behavior normal. Behavior is cooperative.        Thought Content: Thought content normal.        Judgment: Judgment normal.     Results for orders placed or performed in visit on 07/22/20  CBC with Differential/Platelet  Result Value Ref Range   WBC 6.1 3.4 - 10.8 x10E3/uL   RBC 4.26 3.77 - 5.28 x10E6/uL   Hemoglobin 13.5 11.1 - 15.9 g/dL   Hematocrit 38.6 34.0 - 46.6 %   MCV 91 79 - 97 fL   MCH 31.7 26.6 - 33.0 pg   MCHC 35.0 31.5 - 35.7 g/dL   RDW 12.5 11.7 - 15.4 %   Platelets 152 150 - 450 x10E3/uL   Neutrophils 65 Not Estab. %   Lymphs 24 Not Estab. %   Monocytes 7 Not Estab. %   Eos 2 Not Estab. %   Basos 1 Not Estab. %   Neutrophils Absolute 4.0 1.4 - 7.0 x10E3/uL   Lymphocytes Absolute 1.5 0.7 - 3.1 x10E3/uL   Monocytes Absolute 0.4 0.1 - 0.9 x10E3/uL   EOS  (ABSOLUTE) 0.1 0.0 - 0.4 x10E3/uL   Basophils Absolute 0.1 0.0 - 0.2 x10E3/uL   Immature Granulocytes 1 Not Estab. %   Immature Grans (Abs) 0.0 0.0 - 0.1 x10E3/uL  Comprehensive metabolic panel  Result Value Ref Range   Glucose 119 (H) 65 - 99 mg/dL   BUN 13 6 - 24 mg/dL   Creatinine, Ser 0.99 0.57 - 1.00 mg/dL   GFR calc non Af Amer 63 >59 mL/min/1.73   GFR calc Af Amer 73 >59 mL/min/1.73   BUN/Creatinine Ratio 13 9 - 23   Sodium 139 134 - 144 mmol/L   Potassium 4.5 3.5 - 5.2 mmol/L   Chloride 102 96 - 106 mmol/L   CO2 21 20 - 29 mmol/L   Calcium 9.4 8.7 - 10.2 mg/dL   Total Protein 6.7 6.0 - 8.5 g/dL   Albumin 4.7 3.8 - 4.9 g/dL   Globulin, Total 2.0 1.5 - 4.5 g/dL   Albumin/Globulin Ratio 2.4 (H) 1.2 - 2.2   Bilirubin Total 0.3 0.0 - 1.2 mg/dL   Alkaline Phosphatase 112 44 - 121 IU/L   AST 27 0 - 40 IU/L   ALT 33 (H) 0 - 32 IU/L  Lipid Panel w/o Chol/HDL Ratio  Result Value Ref Range   Cholesterol, Total 237 (H) 100 - 199 mg/dL   Triglycerides 297 (H) 0 - 149 mg/dL   HDL 41 >39 mg/dL   VLDL Cholesterol Cal 54 (H) 5 - 40 mg/dL   LDL Chol Calc (NIH) 142 (H) 0 - 99 mg/dL  TSH  Result Value Ref Range   TSH 2.490 0.450 - 4.500 uIU/mL  VITAMIN D 25 Hydroxy (Vit-D Deficiency, Fractures)  Result Value Ref Range   Vit D, 25-Hydroxy 32.8 30.0 - 100.0 ng/mL      Assessment & Plan:   Problem List  Items Addressed This Visit      Cardiovascular and Mediastinum   Hot flashes due to menopause    Ongoing, stable.  Continue Celexa and Gabapentin.  Continue Melatonin or Trazodone as needed for sleep.  Plan on follow-up in 6 months for physical or sooner if worsening symptoms present.        Endocrine   IFG (impaired fasting glucose)    Recheck CMP today and plan on A1c if elevation present.      Relevant Orders   HgB A1c     Genitourinary   OAB (overactive bladder)    Chronic, ongoing, mixed with stress incontinence.  Increase Ditropan XL to 10 MG daily, script sent --  discussed with her risks with this medication once reaches age 45 and may consider alternate at that time.  Discussed pelvic floor physical therapy, she has not tried this, will place referral for this.  Plan on follow-up in 6 weeks and if ongoing consider referral to urology for further work-up and recommendations.      Relevant Orders   Ambulatory referral to Physical Therapy     Other   Generalized anxiety disorder    Chronic, ongoing with PHQ 0 = 19 and Gad 7 = 20.  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  Denies SI/HI.  Continue Citalopram 40 MG daily, which benefits both mood and menopausal symptoms, plus Trazodone PRN 25-50 MG at HS for short period for sleep needs, will add on Buspar 5 MG BID to help with anxiety.  Discussed various methods of meditation and relaxation techniques at home.  Return in 6 weeks for follow-up.      Relevant Medications   busPIRone (BUSPAR) 5 MG tablet   Pure hypercholesterolemia - Primary    Chronic, ongoing.  ASCVD 3.2%.  Repeat lipid panel today and focus on diet.  Fasting.      Relevant Orders   Lipid Panel w/o Chol/HDL Ratio   Comprehensive metabolic panel   Vitamin D deficiency    Chronic, ongoing.  Continue supplement and recheck Vitamin D level next visit.  May consider return to Vit D weekly if on lower side.      Obesity    BMI 31.44. Recommended eating smaller high protein, low fat meals more frequently and exercising 30 mins a day 5 times a week with a goal of 10-15lb weight loss in the next 3 months. Patient voiced their understanding and motivation to adhere to these recommendations.       Stress incontinence in female    Ongoing mixed with OAB -- refer to OAB plan of care for further.      Relevant Medications   oxybutynin (DITROPAN-XL) 10 MG 24 hr tablet   Other Relevant Orders   Ambulatory referral to Physical Therapy       Follow up plan: Return in about 6 weeks (around 03/02/2021) for Anxiety and Overactive bladder/stress incontinence.

## 2021-01-19 NOTE — Assessment & Plan Note (Signed)
Chronic, ongoing, mixed with stress incontinence.  Increase Ditropan XL to 10 MG daily, script sent -- discussed with her risks with this medication once reaches age 60 and may consider alternate at that time.  Discussed pelvic floor physical therapy, she has not tried this, will place referral for this.  Plan on follow-up in 6 weeks and if ongoing consider referral to urology for further work-up and recommendations.

## 2021-01-19 NOTE — Assessment & Plan Note (Addendum)
Chronic, ongoing with PHQ 0 = 19 and Gad 7 = 20.  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  Denies SI/HI.  Continue Citalopram 40 MG daily, which benefits both mood and menopausal symptoms, plus Trazodone PRN 25-50 MG at HS for short period for sleep needs, will add on Buspar 5 MG BID to help with anxiety.  Discussed various methods of meditation and relaxation techniques at home.  Return in 6 weeks for follow-up.

## 2021-01-19 NOTE — Assessment & Plan Note (Signed)
Chronic, ongoing.  ASCVD 3.2%.  Repeat lipid panel today and focus on diet.  Fasting.

## 2021-01-19 NOTE — Patient Instructions (Signed)
Overactive Bladder, Adult  Overactive bladder is a condition in which a person has a sudden and frequent need to urinate. A person might also leak urine if he or she cannot get to the bathroom fast enough (urinary incontinence). Sometimes, symptoms can interfere with work or social activities. What are the causes? Overactive bladder is associated with poor nerve signals between your bladder and your brain. Your bladder may get the signal to empty before it is full. You may also have very sensitive muscles that make your bladder squeeze too soon. This condition may also be caused by other factors, such as:  Medical conditions: ? Urinary tract infection. ? Infection of nearby tissues. ? Prostate enlargement. ? Bladder stones, inflammation, or tumors. ? Diabetes. ? Muscle or nerve weakness, especially from these conditions:  A spinal cord injury.  Stroke.  Multiple sclerosis.  Parkinson's disease.  Other causes: ? Surgery on the uterus or urethra. ? Drinking too much caffeine or alcohol. ? Certain medicines, especially those that eliminate extra fluid in the body (diuretics). ? Constipation. What increases the risk? You may be at greater risk for overactive bladder if you:  Are an older adult.  Smoke.  Are going through menopause.  Have prostate problems.  Have a neurological disease, such as stroke, dementia, Parkinson's disease, or multiple sclerosis (MS).  Eat or drink alcohol, spicy food, caffeine, and other things that irritate the bladder.  Are overweight or obese. What are the signs or symptoms? Symptoms of this condition include a sudden, strong urge to urinate. Other symptoms include:  Leaking urine.  Urinating 8 or more times a day.  Waking up to urinate 2 or more times overnight. How is this diagnosed? This condition may be diagnosed based on:  Your symptoms and medical history.  A physical exam.  Blood or urine tests to check for possible causes,  such as infection. You may also need to see a health care provider who specializes in urinary tract problems. This is called a urologist. How is this treated? Treatment for overactive bladder depends on the cause of your condition and whether it is mild or severe. Treatment may include:  Bladder training, such as: ? Learning to control the urge to urinate by following a schedule to urinate at regular intervals. ? Doing Kegel exercises to strengthen the pelvic floor muscles that support your bladder.  Special devices, such as: ? Biofeedback. This uses sensors to help you become aware of your body's signals. ? Electrical stimulation. This uses electrodes placed inside the body (implanted) or outside the body. These electrodes send gentle pulses of electricity to strengthen the nerves or muscles that control the bladder. ? Women may use a plastic device, called a pessary, that fits into the vagina and supports the bladder.  Medicines, such as: ? Antibiotics to treat bladder infection. ? Antispasmodics to stop the bladder from releasing urine at the wrong time. ? Tricyclic antidepressants to relax bladder muscles. ? Injections of botulinum toxin type A directly into the bladder tissue to relax bladder muscles.  Surgery, such as: ? A device may be implanted to help manage the nerve signals that control urination. ? An electrode may be implanted to stimulate electrical signals in the bladder. ? A procedure may be done to change the shape of the bladder. This is done only in very severe cases. Follow these instructions at home: Eating and drinking  Make diet or lifestyle changes recommended by your health care provider. These may include: ? Drinking fluids   throughout the day and not only with meals. ? Cutting down on caffeine or alcohol. ? Eating a healthy and balanced diet to prevent constipation. This may include:  Choosing foods that are high in fiber, such as beans, whole grains, and  fresh fruits and vegetables.  Limiting foods that are high in fat and processed sugars, such as fried and sweet foods.   Lifestyle  Lose weight if needed.  Do not use any products that contain nicotine or tobacco. These include cigarettes, chewing tobacco, and vaping devices, such as e-cigarettes. If you need help quitting, ask your health care provider.   General instructions  Take over-the-counter and prescription medicines only as told by your health care provider.  If you were prescribed an antibiotic medicine, take it as told by your health care provider. Do not stop taking the antibiotic even if you start to feel better.  Use any implants or pessary as told by your health care provider.  If needed, wear pads to absorb urine leakage.  Keep a log to track how much and when you drink, and when you need to urinate. This will help your health care provider monitor your condition.  Keep all follow-up visits. This is important. Contact a health care provider if:  You have a fever or chills.  Your symptoms do not get better with treatment.  Your pain and discomfort get worse.  You have more frequent urges to urinate. Get help right away if:  You are not able to control your bladder. Summary  Overactive bladder refers to a condition in which a person has a sudden and frequent need to urinate.  Several conditions may lead to an overactive bladder.  Treatment for overactive bladder depends on the cause and severity of your condition.  Making lifestyle changes, doing Kegel exercises, keeping a log, and taking medicines can help with this condition. This information is not intended to replace advice given to you by your health care provider. Make sure you discuss any questions you have with your health care provider. Document Revised: 05/17/2020 Document Reviewed: 05/17/2020 Elsevier Patient Education  2021 [employer]  

## 2021-01-19 NOTE — Assessment & Plan Note (Signed)
Recheck CMP today and plan on A1c if elevation present.

## 2021-01-19 NOTE — Assessment & Plan Note (Signed)
Chronic, ongoing.  Continue supplement and recheck Vitamin D level next visit.  May consider return to Vit D weekly if on lower side.

## 2021-01-19 NOTE — Assessment & Plan Note (Addendum)
Ongoing, stable.  Continue Celexa and Gabapentin.  Continue Melatonin or Trazodone as needed for sleep.  Plan on follow-up in 6 months for physical or sooner if worsening symptoms present.

## 2021-01-19 NOTE — Assessment & Plan Note (Signed)
Ongoing mixed with OAB -- refer to OAB plan of care for further.

## 2021-01-19 NOTE — Assessment & Plan Note (Signed)
BMI 31.44. Recommended eating smaller high protein, low fat meals more frequently and exercising 30 mins a day 5 times a week with a goal of 10-15lb weight loss in the next 3 months. Patient voiced their understanding and motivation to adhere to these recommendations.

## 2021-01-20 LAB — COMPREHENSIVE METABOLIC PANEL
ALT: 37 IU/L — ABNORMAL HIGH (ref 0–32)
AST: 41 IU/L — ABNORMAL HIGH (ref 0–40)
Albumin/Globulin Ratio: 2.2 (ref 1.2–2.2)
Albumin: 4.7 g/dL (ref 3.8–4.9)
Alkaline Phosphatase: 111 IU/L (ref 44–121)
BUN/Creatinine Ratio: 17 (ref 9–23)
BUN: 13 mg/dL (ref 6–24)
Bilirubin Total: 0.3 mg/dL (ref 0.0–1.2)
CO2: 24 mmol/L (ref 20–29)
Calcium: 9.5 mg/dL (ref 8.7–10.2)
Chloride: 101 mmol/L (ref 96–106)
Creatinine, Ser: 0.77 mg/dL (ref 0.57–1.00)
Globulin, Total: 2.1 g/dL (ref 1.5–4.5)
Glucose: 116 mg/dL — ABNORMAL HIGH (ref 65–99)
Potassium: 4.6 mmol/L (ref 3.5–5.2)
Sodium: 139 mmol/L (ref 134–144)
Total Protein: 6.8 g/dL (ref 6.0–8.5)
eGFR: 89 mL/min/{1.73_m2} (ref >59)

## 2021-01-20 LAB — LIPID PANEL W/O CHOL/HDL RATIO
Cholesterol, Total: 240 mg/dL — ABNORMAL HIGH (ref 100–199)
HDL: 38 mg/dL — ABNORMAL LOW (ref >39)
LDL Chol Calc (NIH): 114 mg/dL — ABNORMAL HIGH (ref 0–99)
Triglycerides: 503 mg/dL — ABNORMAL HIGH (ref 0–149)
VLDL Cholesterol Cal: 88 mg/dL — ABNORMAL HIGH (ref 5–40)

## 2021-01-20 MED ORDER — OMEGA-3-ACID ETHYL ESTERS 1 G PO CAPS: 2.0000 g | ORAL_CAPSULE | Freq: Two times a day (BID) | ORAL | 4 refills | Status: DC

## 2021-01-20 NOTE — Progress Notes (Signed)
Contacted via MyChart The 10-year ASCVD risk score Mikey Bussing DC Jr., et al., 2013) is: 3.4%   Values used to calculate the score:     Age: 60 years     Sex: Female     Is Non-Hispanic African American: No     Diabetic: No     Tobacco smoker: No     Systolic Blood Pressure: 199 mmHg     Is BP treated: No     HDL Cholesterol: 38 mg/dL     Total Cholesterol: 240 mg/dL   Good afternoon Karen Lambert, your labs have returned: - Cholesterol levels continue to be elevated.  Triglycerides a bit more elevated.  Did you eat before visit?  Let me know. If you did fast I recommend starting Omega-3 fish oil and some medication to help lower triglycerides.  If you were not fasting I recommend we recheck this on an outpatient lab only with you fasting.   - Kidney function, creatinine and eGFR, is normal.  Liver function has a little more elevation in AST and ALT this check at 41 and 37, recommend cutting back on an alcohol or Tylenol use.  Will recheck next visit.  Any questions? Keep being awesome!!  Thank you for allowing me to participate in your care.  I appreciate you. Kindest regards, Ravyn Nikkel

## 2021-03-02 IMAGING — MG DIGITAL DIAGNOSTIC BILATERAL MAMMOGRAM WITH TOMO AND CAD
8 series · 8 of 24 positions shown · non-contrast
Comparison: Previous exam(s).

CLINICAL DATA: Delayed follow-up. Patient had a diagnostic study on
12/06/2015 where a probable benign mass was seen in the left breast
and short-term interval follow-up was recommended.

EXAM:
DIGITAL DIAGNOSTIC BILATERAL MAMMOGRAM WITH CAD AND TOMO
ULTRASOUND LEFT BREAST

[R MLO synth-2D]
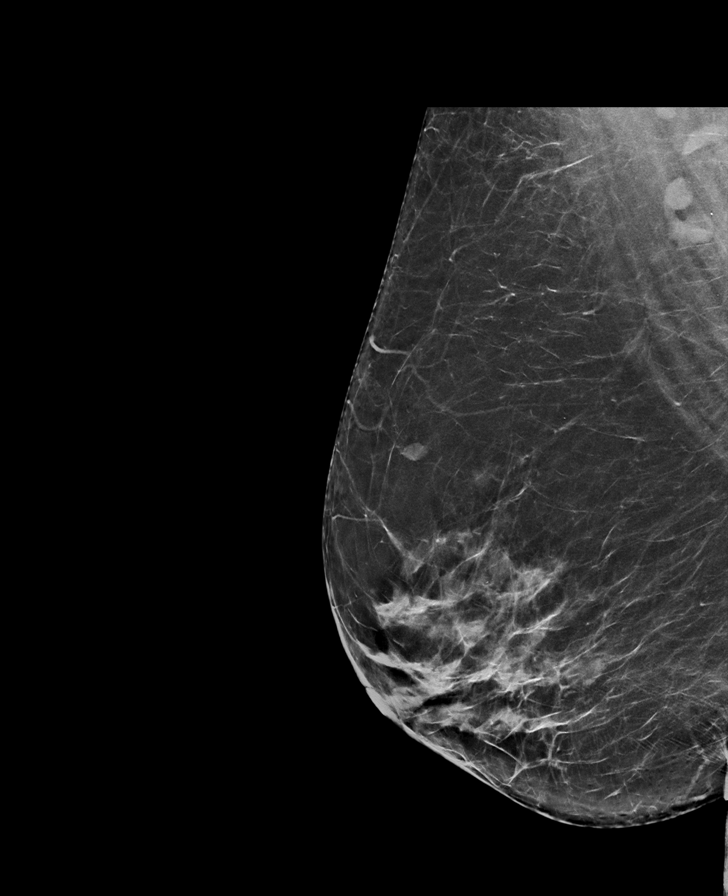

[L MLO synth-2D]
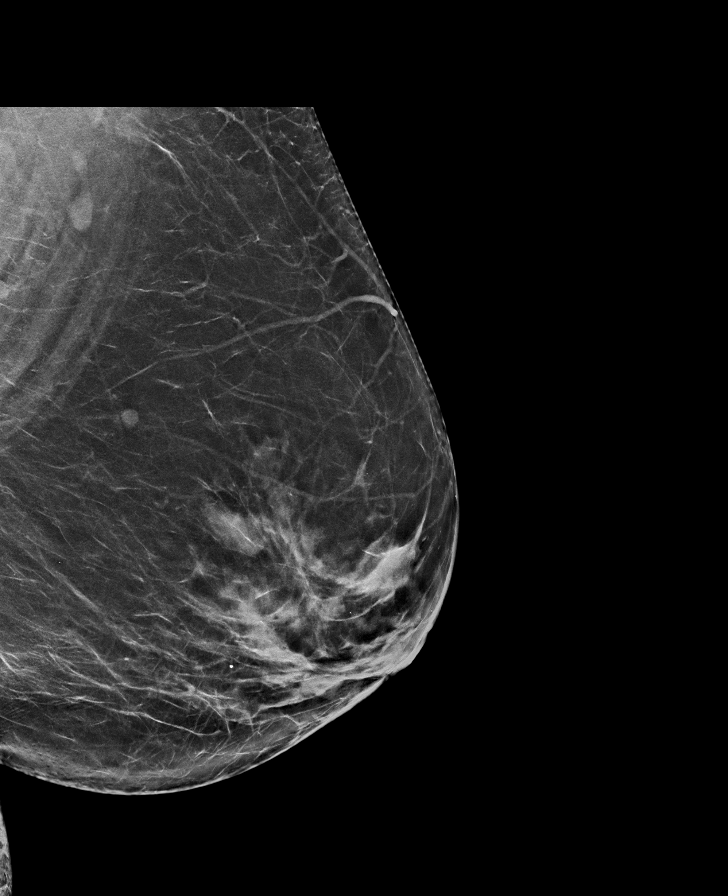

[R CC synth-2D]
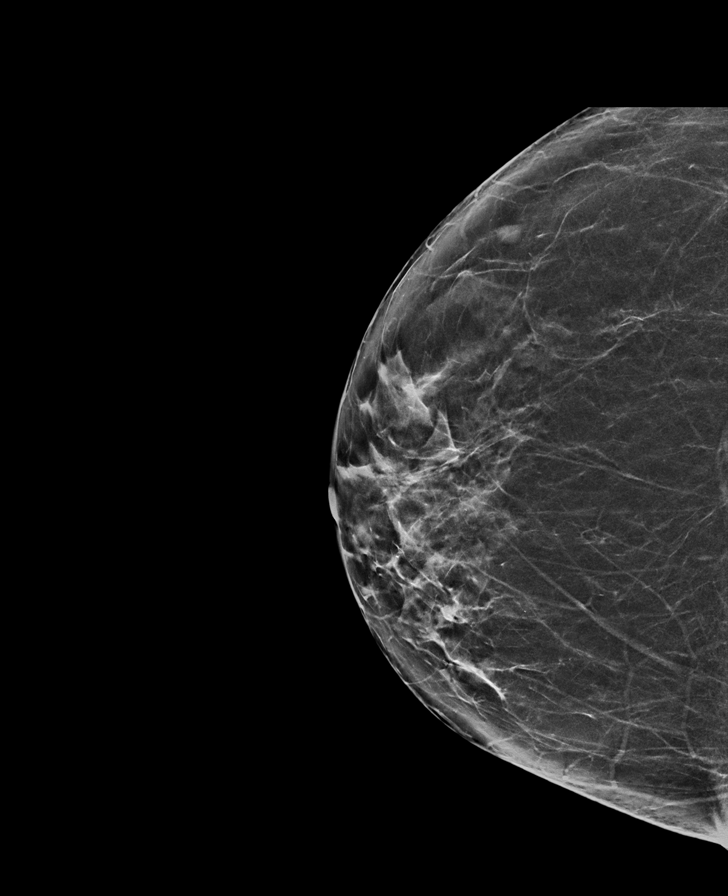

[L CC synth-2D]
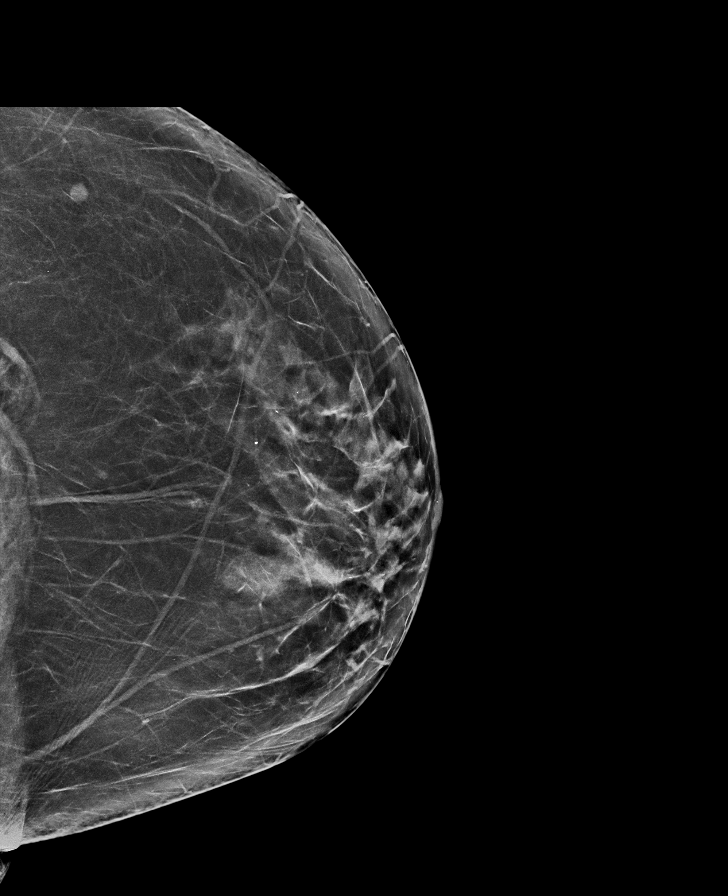

[L MLO tomo · tomo slice 39/76.0]
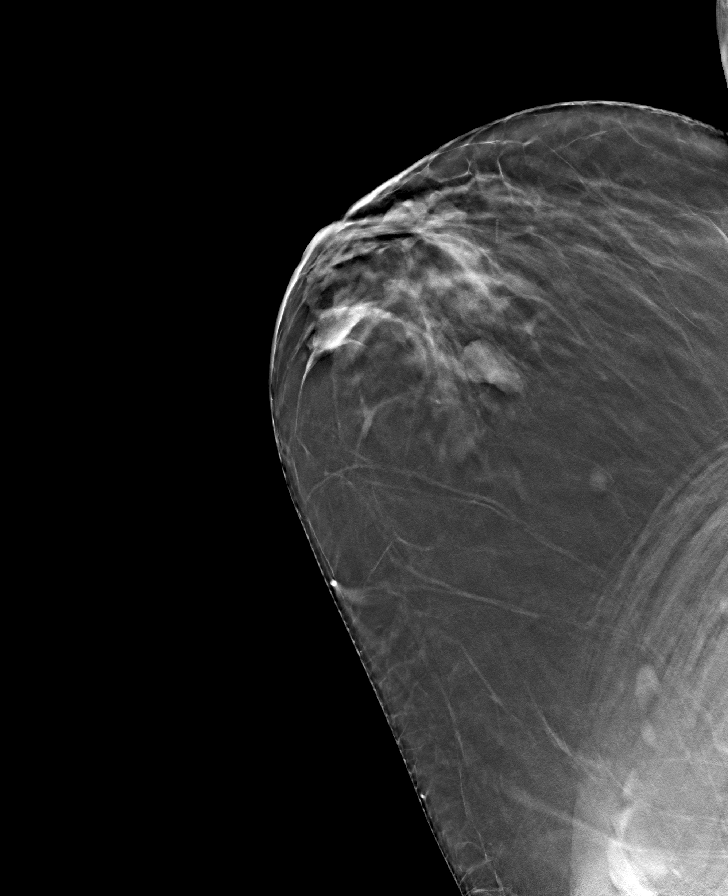

[R MLO tomo · tomo slice 39/78.0]
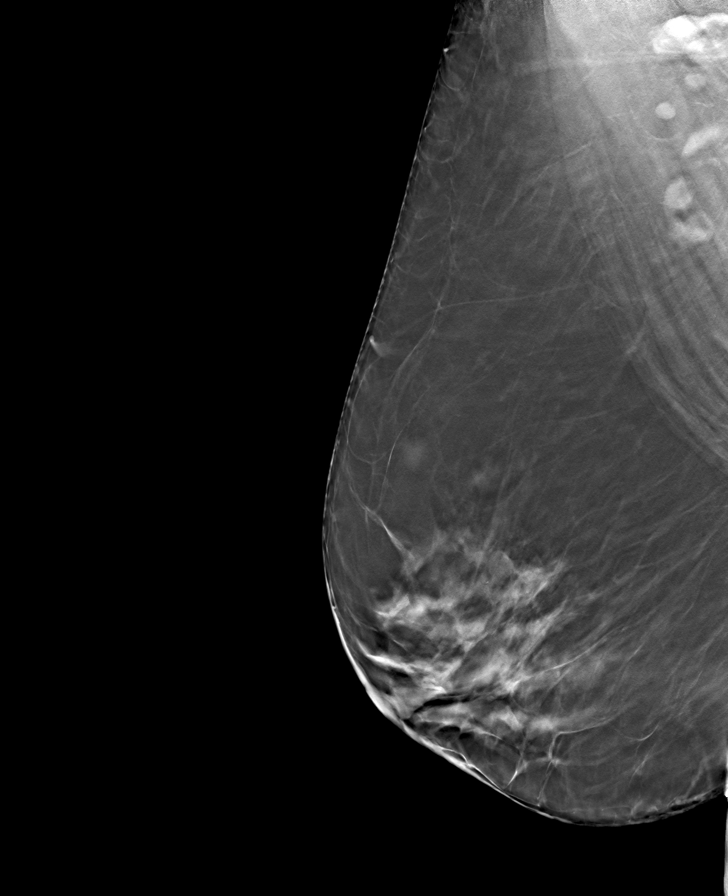

[R CC tomo · tomo slice 33/66.0]
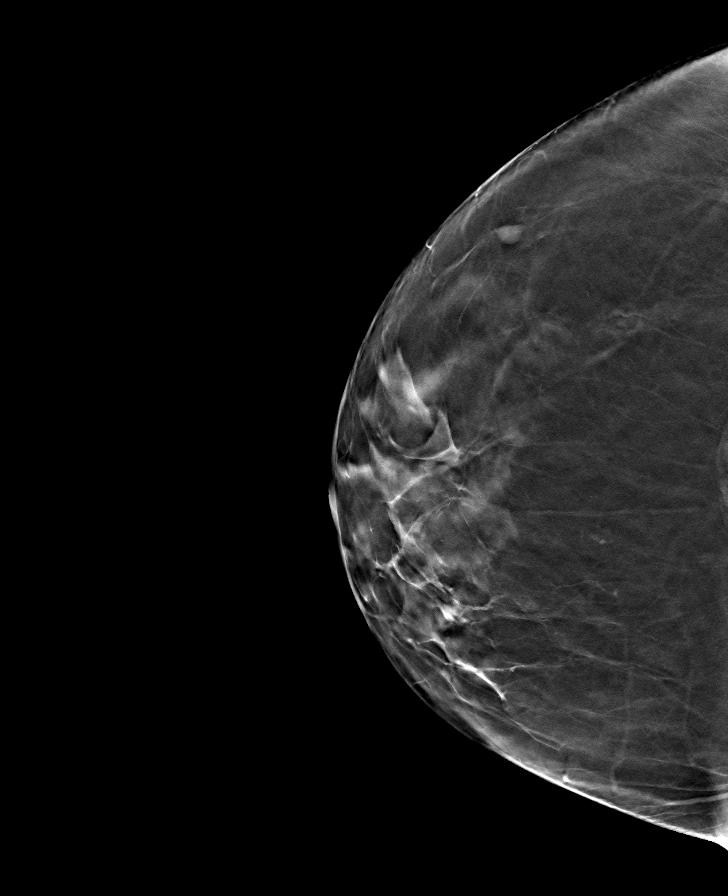

[L CC tomo · tomo slice 37/73.0]
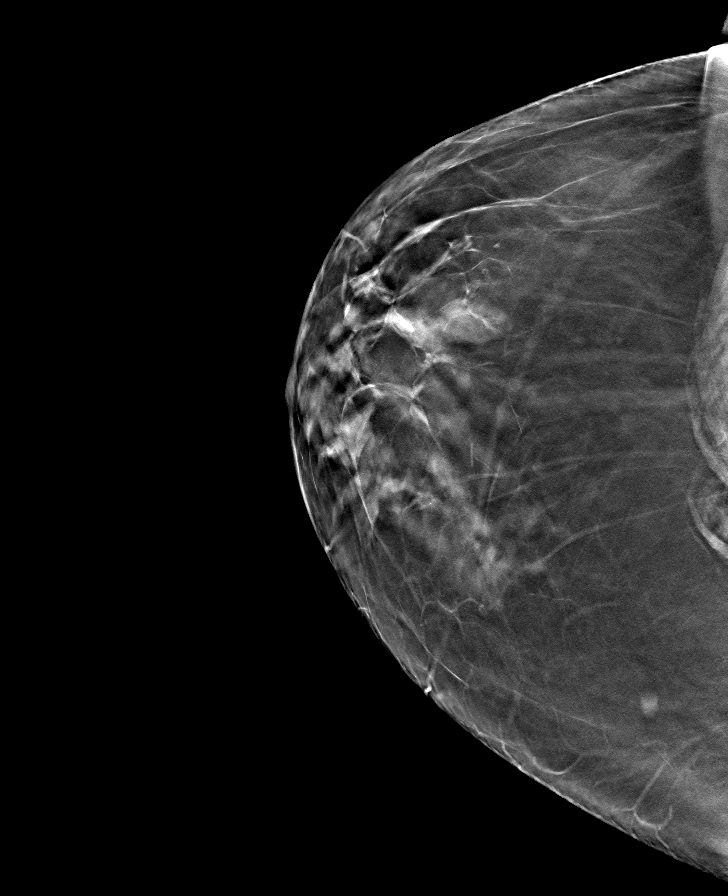

[8 of 24 positions shown; findings below may reference images not displayed]

ACR Breast Density Category b: There are scattered areas of
fibroglandular density.
FINDINGS: There is a stable ovoid 1.9 cm mass in the anterior third of the
upper inner quadrant of the left breast. No suspicious mass or
malignant type microcalcifications identified in either breast.

Mammographic images were processed with CAD.

Targeted ultrasound is performed, showing a well-circumscribed
hypoechoic mass in the left breast 11 o'clock 5 cm from the nipple
measuring 1.6 x 0.7 x 1.1 cm. On the prior ultrasound dated
12/06/2015 it measured 1.7 x 0.7 x 1.3 cm.
IMPRESSION: Stable benign appearing mass in the left breast compatible with
fibroadenoma. No evidence of malignancy in either breast.

RECOMMENDATION:
Screening mammogram in 1 year is recommended.

I have discussed the findings and recommendations with the patient.
Results were also provided in writing at the conclusion of the
visit. If applicable, a reminder letter will be sent to the patient
regarding the next appointment.

BI-RADS CATEGORY  2: Benign.

## 2021-03-03 ENCOUNTER — Encounter: Payer: Self-pay | Admitting: Nurse Practitioner

## 2021-03-03 ENCOUNTER — Other Ambulatory Visit: Payer: Self-pay

## 2021-03-03 ENCOUNTER — Ambulatory Visit (INDEPENDENT_AMBULATORY_CARE_PROVIDER_SITE_OTHER): Payer: No Typology Code available for payment source | Admitting: Nurse Practitioner

## 2021-03-03 VITALS — BP 112/72 | HR 54 | Temp 98.3°F | Wt 166.7 lb

## 2021-03-03 DIAGNOSIS — F411 Generalized anxiety disorder: Secondary | ICD-10-CM | POA: Diagnosis not present

## 2021-03-03 DIAGNOSIS — N3281 Overactive bladder: Secondary | ICD-10-CM

## 2021-03-03 DIAGNOSIS — E7800 Pure hypercholesterolemia, unspecified: Secondary | ICD-10-CM | POA: Diagnosis not present

## 2021-03-03 DIAGNOSIS — E6609 Other obesity due to excess calories: Secondary | ICD-10-CM

## 2021-03-03 DIAGNOSIS — N393 Stress incontinence (female) (male): Secondary | ICD-10-CM

## 2021-03-03 DIAGNOSIS — Z6831 Body mass index (BMI) 31.0-31.9, adult: Secondary | ICD-10-CM

## 2021-03-03 NOTE — Assessment & Plan Note (Signed)
Chronic, ongoing with PHQ 0 = 6 and Gad 7 = 20.  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  Denies SI/HI.  Continue Citalopram 40 MG daily, which benefits both mood and menopausal symptoms, plus Trazodone PRN 25-50 MG at HS for short period for sleep needs, recommend she increase Buspar to 5 MG BID to help with anxiety and may increase to 10 MG BID if needed in future.  Discussed various methods of meditation and relaxation techniques at home.  Return in November for physical.

## 2021-03-03 NOTE — Assessment & Plan Note (Signed)
BMI 31.50.  Recommended eating smaller high protein, low fat meals more frequently and exercising 30 mins a day 5 times a week with a goal of 10-15lb weight loss in the next 3 months. Patient voiced their understanding and motivation to adhere to these recommendations.

## 2021-03-03 NOTE — Patient Instructions (Signed)

## 2021-03-03 NOTE — Assessment & Plan Note (Signed)
Ongoing mixed with OAB -- refer to OAB plan of care for further.

## 2021-03-03 NOTE — Progress Notes (Signed)
BP 112/72   Pulse (!) 54   Temp 98.3 F (36.8 C) (Oral)   Wt 166 lb 11.2 oz (75.6 kg)   SpO2 96%   BMI 31.50 kg/m    Subjective:    Patient ID: Karen Lambert, female    DOB: December 07, 1960, 60 y.o.   MRN: [id number]  HPI: Karen Lambert is a 60 y.o. female  Chief Complaint  Patient presents with   Anxiety   HYPERLIPIDEMIA Recent triglycerides were 503 -- started on Lovaza. Hyperlipidemia status: good compliance Satisfied with current treatment?  yes Side effects:  no Medication compliance: good compliance Past cholesterol meds:  Lovaza Supplements: none Aspirin:  no The 10-year ASCVD risk score Mikey Bussing DC Jr., et al., 2013) is: 3.5%   Values used to calculate the score:     Age: 72 years     Sex: Female     Is Non-Hispanic African American: No     Diabetic: No     Tobacco smoker: No     Systolic Blood Pressure: 340 mmHg     Is BP treated: No     HDL Cholesterol: 38 mg/dL     Total Cholesterol: 240 mg/dL Chest pain:  no Coronary artery disease:  no Family history CAD:  yes Family history early CAD:  no  ANXIETY/STRESS Continues on Celexa 40 MG and Buspar 5 MG just at night at this time, not taking during day.  Is taking Trazodone for sleep as needed.  Does endorse some anxiety due to caring for her mother. Duration:controlled Anxious mood: yes Excessive worrying: yes Irritability: yes Sweating: no Nausea: no Palpitations:no Hyperventilation: no Panic attacks: no Agoraphobia: no  Obscessions/compulsions: no Depressed mood: no Depression screen [facility] 2/9 03/03/2021 01/19/2021 07/22/2020 12/26/2019 11/27/2018  Decreased Interest 1 2 3 1  0  Down, Depressed, Hopeless 1 2 2 1  0  PHQ - 2 Score 2 4 5 2  0  Altered sleeping 2 2 2 2 1   Tired, decreased energy 1 3 1 3 2   Change in appetite 0 3 1 1 1   Feeling bad or failure about yourself  0 3 0 0 0  Trouble concentrating 0 2 1 0 1  Moving slowly or fidgety/restless 0 2 0 0 0  Suicidal thoughts 0 0 0 0 0  PHQ-9 Score 5 19  10 8 5   Difficult doing work/chores Not difficult at all - Somewhat difficult Somewhat difficult Not difficult at all   Anhedonia: no Weight changes: no Insomnia: occasional Hypersomnia: no Fatigue/loss of energy: no Feelings of worthlessness: no Feelings of guilt: no Impaired concentration/indecisiveness: yes Suicidal ideations: no  Crying spells: no Recent Stressors/Life Changes: no   Relationship problems: no   Family stress: no     Financial stress: no    Job stress: no    Recent death/loss: no  GAD 7 : Generalized Anxiety Score 03/03/2021 01/19/2021 12/26/2019 11/27/2018  Nervous, Anxious, on Edge 3 3 3 1   Control/stop worrying 3 3 3 1   Worry too much - different things 3 3 3 1   Trouble relaxing 3 3 1 1   Restless 2 2 1  0  Easily annoyed or irritable 3 3 3  0  Afraid - awful might happen 3 3 1  0  Total GAD 7 Score 20 20 15 4   Anxiety Difficulty - Somewhat difficult Somewhat difficult Not difficult at all    OVERACTIVE BLADDER Increased last visit: Oxybutynin XL 10 MG every day.  She reports improved symptoms with no more wetting  bed at night or dribbling.  Ordered physical therapy for pelvis, but she did not attend due to work schedule.   Has one child -- had c-section.  She does drink water often -- takes this with her to bed too.  No sodas or caffeine.   Dysuria: no Urinary frequency: none Urgency: no Small volume voids: no Symptom severity: no Urinary incontinence: none Foul odor: no Hematuria: no Abdominal pain: no Back pain: no Suprapubic pain/pressure: no Flank pain: no Fever:  no Status: better/worse/stable Previous urinary tract infection: yes Recurrent urinary tract infection: no Sexual activity: Not sexually active Treatments attempted: Oxybutynin   Relevant past medical, surgical, family and social history reviewed and updated as indicated. Interim medical history since our last visit reviewed. Allergies and medications reviewed and updated.  Review  of Systems  Constitutional:  Negative for activity change, appetite change, diaphoresis, fatigue and fever.  Respiratory:  Negative for cough, chest tightness and shortness of breath.   Cardiovascular:  Negative for chest pain, palpitations and leg swelling.  Gastrointestinal: Negative.   Genitourinary:  Negative for decreased urine volume, dysuria, frequency, hematuria, pelvic pain and urgency.  Neurological: Negative.   Psychiatric/Behavioral:  Positive for decreased concentration and sleep disturbance. Negative for self-injury and suicidal ideas. The patient is nervous/anxious.    Per HPI unless specifically indicated above     Objective:    BP 112/72   Pulse (!) 54   Temp 98.3 F (36.8 C) (Oral)   Wt 166 lb 11.2 oz (75.6 kg)   SpO2 96%   BMI 31.50 kg/m   Wt Readings from Last 3 Encounters:  03/03/21 166 lb 11.2 oz (75.6 kg)  01/19/21 166 lb 6.4 oz (75.5 kg)  07/22/20 165 lb 3.2 oz (74.9 kg)    Physical Exam Vitals and nursing note reviewed.  Constitutional:      General: She is awake. She is not in acute distress.    Appearance: She is well-developed. She is not ill-appearing.  HENT:     Head: Normocephalic.     Right Ear: Hearing normal.     Left Ear: Hearing normal.  Eyes:     General: Lids are normal.        Right eye: No discharge.        Left eye: No discharge.     Conjunctiva/sclera: Conjunctivae normal.     Pupils: Pupils are equal, round, and reactive to light.  Neck:     Vascular: No carotid bruit.  Cardiovascular:     Rate and Rhythm: Normal rate and regular rhythm.     Heart sounds: Normal heart sounds. No murmur heard.   No gallop.  Pulmonary:     Effort: Pulmonary effort is normal. No accessory muscle usage or respiratory distress.     Breath sounds: Normal breath sounds.  Abdominal:     General: Bowel sounds are normal.     Palpations: Abdomen is soft.  Musculoskeletal:     Cervical back: Normal range of motion and neck supple.     Right lower  leg: No edema.     Left lower leg: No edema.  Skin:    General: Skin is warm and dry.  Neurological:     Mental Status: She is alert and oriented to person, place, and time.  Psychiatric:        Attention and Perception: Attention normal.        Mood and Affect: Mood normal.        Behavior: Behavior  normal. Behavior is cooperative.        Thought Content: Thought content normal.        Judgment: Judgment normal.    Results for orders placed or performed in visit on 01/19/21  Lipid Panel w/o Chol/HDL Ratio  Result Value Ref Range   Cholesterol, Total 240 (H) 100 - 199 mg/dL   Triglycerides 503 (H) 0 - 149 mg/dL   HDL 38 (L) >39 mg/dL   VLDL Cholesterol Cal 88 (H) 5 - 40 mg/dL   LDL Chol Calc (NIH) 114 (H) 0 - 99 mg/dL  Comprehensive metabolic panel  Result Value Ref Range   Glucose 116 (H) 65 - 99 mg/dL   BUN 13 6 - 24 mg/dL   Creatinine, Ser 0.77 0.57 - 1.00 mg/dL   eGFR 89 >59 mL/min/1.73   BUN/Creatinine Ratio 17 9 - 23   Sodium 139 134 - 144 mmol/L   Potassium 4.6 3.5 - 5.2 mmol/L   Chloride 101 96 - 106 mmol/L   CO2 24 20 - 29 mmol/L   Calcium 9.5 8.7 - 10.2 mg/dL   Total Protein 6.8 6.0 - 8.5 g/dL   Albumin 4.7 3.8 - 4.9 g/dL   Globulin, Total 2.1 1.5 - 4.5 g/dL   Albumin/Globulin Ratio 2.2 1.2 - 2.2   Bilirubin Total 0.3 0.0 - 1.2 mg/dL   Alkaline Phosphatase 111 44 - 121 IU/L   AST 41 (H) 0 - 40 IU/L   ALT 37 (H) 0 - 32 IU/L      Assessment & Plan:   Problem List Items Addressed This Visit       Genitourinary   OAB (overactive bladder)    Chronic, ongoing, mixed with stress incontinence -- symptoms improved at this time with increase in medication.  Continue Ditropan XL 10 MG daily -- discussed with her risks with this medication once reaches age 69 and may consider alternate at that time.  Discussed pelvic floor physical therapy, she has not tried this, did not attend therapy that was ordered -- may readdress in future.  Plan on follow-up in November for  physical.         Other   Generalized anxiety disorder - Primary    Chronic, ongoing with PHQ 0 = 6 and Gad 7 = 20.  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  Denies SI/HI.  Continue Citalopram 40 MG daily, which benefits both mood and menopausal symptoms, plus Trazodone PRN 25-50 MG at HS for short period for sleep needs, recommend she increase Buspar to 5 MG BID to help with anxiety and may increase to 10 MG BID if needed in future.  Discussed various methods of meditation and relaxation techniques at home.  Return in November for physical.       Pure hypercholesterolemia    Chronic, ongoing.  ASCVD 3.5%.  Repeat lipid panel today and focus on diet.  Fasting.  Continue Lovaza at this time and discussed with her we may add on a statin due to family history.       Relevant Orders   Comprehensive metabolic panel   Lipid Panel w/o Chol/HDL Ratio   Obesity    BMI 31.50.  Recommended eating smaller high protein, low fat meals more frequently and exercising 30 mins a day 5 times a week with a goal of 10-15lb weight loss in the next 3 months. Patient voiced their understanding and motivation to adhere to these recommendations.        Stress incontinence  in female    Ongoing mixed with OAB -- refer to OAB plan of care for further.         Follow up plan: Return in about 5 months (around 07/25/2021) for Annual physical.

## 2021-03-03 NOTE — Assessment & Plan Note (Signed)
Chronic, ongoing, mixed with stress incontinence -- symptoms improved at this time with increase in medication.  Continue Ditropan XL 10 MG daily -- discussed with her risks with this medication once reaches age 60 and may consider alternate at that time.  Discussed pelvic floor physical therapy, she has not tried this, did not attend therapy that was ordered -- may readdress in future.  Plan on follow-up in November for physical.

## 2021-03-03 NOTE — Assessment & Plan Note (Signed)
Chronic, ongoing.  ASCVD 3.5%.  Repeat lipid panel today and focus on diet.  Fasting.  Continue Lovaza at this time and discussed with her we may add on a statin due to family history.

## 2021-03-04 ENCOUNTER — Other Ambulatory Visit: Payer: Self-pay | Admitting: Nurse Practitioner

## 2021-03-04 LAB — COMPREHENSIVE METABOLIC PANEL
ALT: 44 IU/L — ABNORMAL HIGH (ref 0–32)
AST: 38 IU/L (ref 0–40)
Albumin/Globulin Ratio: 2.3 — ABNORMAL HIGH (ref 1.2–2.2)
Albumin: 4.6 g/dL (ref 3.8–4.9)
Alkaline Phosphatase: 113 IU/L (ref 44–121)
BUN/Creatinine Ratio: 18 (ref 9–23)
BUN: 16 mg/dL (ref 6–24)
Bilirubin Total: 0.2 mg/dL (ref 0.0–1.2)
CO2: 20 mmol/L (ref 20–29)
Calcium: 9.2 mg/dL (ref 8.7–10.2)
Chloride: 101 mmol/L (ref 96–106)
Creatinine, Ser: 0.89 mg/dL (ref 0.57–1.00)
Globulin, Total: 2 g/dL (ref 1.5–4.5)
Glucose: 113 mg/dL — ABNORMAL HIGH (ref 65–99)
Potassium: 4.5 mmol/L (ref 3.5–5.2)
Sodium: 140 mmol/L (ref 134–144)
Total Protein: 6.6 g/dL (ref 6.0–8.5)
eGFR: 75 mL/min/{1.73_m2} (ref >59)

## 2021-03-04 LAB — LIPID PANEL W/O CHOL/HDL RATIO
Cholesterol, Total: 235 mg/dL — ABNORMAL HIGH (ref 100–199)
HDL: 37 mg/dL — ABNORMAL LOW (ref >39)
LDL Chol Calc (NIH): 127 mg/dL — ABNORMAL HIGH (ref 0–99)
Triglycerides: 397 mg/dL — ABNORMAL HIGH (ref 0–149)
VLDL Cholesterol Cal: 71 mg/dL — ABNORMAL HIGH (ref 5–40)

## 2021-03-04 MED ORDER — ROSUVASTATIN CALCIUM 10 MG PO TABS: 10.0000 mg | ORAL_TABLET | Freq: Every day | ORAL | 3 refills | Status: DC

## 2021-03-04 NOTE — Progress Notes (Signed)
Contacted via MyChart The 10-year ASCVD risk score Mikey Bussing DC Jr., et al., 2013) is: 3.5%   Values used to calculate the score:     Age: 60 years     Sex: Female     Is Non-Hispanic African American: No     Diabetic: No     Tobacco smoker: No     Systolic Blood Pressure: 370 mmHg     Is BP treated: No     HDL Cholesterol: 37 mg/dL     Total Cholesterol: 235 mg/dL  Good evening Senta, your labs have returned.  Sugar remains slightly elevated at 113, we will continue to monitor this.  Kidney function, creatinine and eGFR, are normal.  Liver function shows mild elevation in ALT, but normal AST.  Recommend minimizing any alcohol intake if used.  Cholesterol levels remain elevated, as do triglycerides -- these are lower but still elevated.  I am going to send in a low dose of Rosuvastatin for you to start taking, due to your family history.  I would like to lower all levels.  Will plan on rechecking fasting labs next visit.  If any muscle aches in legs with this or increase fatigue, let me know.  Any questions? Keep being awesome!!  Thank you for allowing me to participate in your care.  I appreciate you. Kindest regards, Emillee Talsma

## 2021-03-20 ENCOUNTER — Other Ambulatory Visit: Payer: Self-pay | Admitting: Nurse Practitioner

## 2021-04-14 ENCOUNTER — Other Ambulatory Visit: Payer: Self-pay | Admitting: Nurse Practitioner

## 2021-04-14 MED ORDER — ROSUVASTATIN CALCIUM 10 MG PO TABS: 10.0000 mg | ORAL_TABLET | Freq: Every day | ORAL | 3 refills | Status: DC

## 2021-04-14 MED ORDER — CITALOPRAM HYDROBROMIDE 40 MG PO TABS: 40.0000 mg | ORAL_TABLET | Freq: Every day | ORAL | 4 refills | Status: DC

## 2021-04-14 MED ORDER — OXYBUTYNIN CHLORIDE ER 10 MG PO TB24: 10.0000 mg | ORAL_TABLET | Freq: Every day | ORAL | 4 refills | Status: DC

## 2021-04-14 MED ORDER — GABAPENTIN 300 MG PO CAPS: ORAL_CAPSULE | ORAL | 4 refills | Status: DC

## 2021-06-19 ENCOUNTER — Other Ambulatory Visit: Payer: Self-pay | Admitting: Nurse Practitioner

## 2021-06-19 NOTE — Telephone Encounter (Signed)
Requested Prescriptions  Pending Prescriptions Disp Refills  . traZODone (DESYREL) 50 MG tablet [Pharmacy Med Name: TRAZODONE 50MG  TABLETS] 90 tablet 0    Sig: TAKE 1/2 TO 1 TABLET(25 TO 50 MG) BY MOUTH AT BEDTIME AS NEEDED FOR SLEEP     Psychiatry: Antidepressants - Serotonin Modulator Passed - 06/19/2021  3:12 AM      Passed - Valid encounter within last 6 months    Recent Outpatient Visits          3 months ago Generalized anxiety disorder   [place] Cannady, Barbaraann Faster, NP   5 months ago Pure hypercholesterolemia   [place], Barbaraann Faster, NP   11 months ago Pure hypercholesterolemia   [place], Finklea T, NP   1 year ago Sinus pressure   [place], Lillian T, NP   1 year ago UTI symptoms   [place], Barbaraann Faster, NP      Future Appointments            In 1 month Cannady, Barbaraann Faster, NP [facility]

## 2021-08-03 ENCOUNTER — Encounter: Payer: No Typology Code available for payment source | Admitting: Nurse Practitioner

## 2021-09-21 ENCOUNTER — Other Ambulatory Visit: Payer: Self-pay | Admitting: Nurse Practitioner

## 2021-09-21 NOTE — Telephone Encounter (Signed)
Attempted to call patient and schedule appointment- left message to call office to schedule. Courtesy 30 day RF given. Requested Prescriptions  Pending Prescriptions Disp Refills   traZODone (DESYREL) 50 MG tablet [Pharmacy Med Name: TRAZODONE 50MG  TABLETS] 30 tablet 0    Sig: TAKE 1/2 TO 1 TABLET(25 TO 50 MG) BY MOUTH AT BEDTIME AS NEEDED FOR SLEEP     Psychiatry: Antidepressants - Serotonin Modulator Failed - 09/21/2021  7:57 AM      Failed - Valid encounter within last 6 months    Recent Outpatient Visits          6 months ago Generalized anxiety disorder   [place] Cannady, Barbaraann Faster, NP   8 months ago Pure hypercholesterolemia   [place], [place] T, NP   1 year ago Pure hypercholesterolemia   [place], [place] T, NP   1 year ago Sinus pressure   [place], [place] T, NP   1 year ago UTI symptoms   [place], Barbaraann Faster, NP

## 2021-10-21 ENCOUNTER — Other Ambulatory Visit: Payer: Self-pay | Admitting: Nurse Practitioner

## 2021-10-21 NOTE — Telephone Encounter (Signed)
Patient must keep upcoming appointment for further refills. Requested Prescriptions  Pending Prescriptions Disp Refills   traZODone (DESYREL) 50 MG tablet [Pharmacy Med Name: TRAZODONE 50MG  TABLETS] 90 tablet 0    Sig: TAKE 1/2 TO 1 TABLET(25 TO 50 MG) BY MOUTH AT BEDTIME AS NEEDED FOR SLEEP     Psychiatry: Antidepressants - Serotonin Modulator Failed - 10/21/2021  3:12 AM      Failed - Valid encounter within last 6 months    Recent Outpatient Visits          7 months ago Generalized anxiety disorder   [place], Barbaraann Faster, NP   9 months ago Pure hypercholesterolemia   [place], [place] T, NP   1 year ago Pure hypercholesterolemia   [place], [place] T, NP   1 year ago Sinus pressure   [place], [place] T, NP   1 year ago UTI symptoms   [place], Barbaraann Faster, NP      Future Appointments            In 1 week Cannady, Barbaraann Faster, NP [facility]

## 2021-10-30 NOTE — Patient Instructions (Addendum)
Please call to schedule your mammogram and/or bone density: °[facility]  °Address: [address], [place], [place] [address]  °Phone: [phone]  ° °Healthy Eating °Following a healthy eating pattern may help you to achieve and maintain a healthy body weight, reduce the risk of chronic disease, and live a long and productive life. It is important to follow a healthy eating pattern at an appropriate calorie level for your body. Your nutritional needs should be met primarily through food by choosing a variety of nutrient-rich foods. °What are tips for following this plan? °Reading food labels °Read labels and choose the following: °Reduced or low sodium. °Juices with 100% fruit juice. °Foods with low saturated fats and high polyunsaturated and monounsaturated fats. °Foods with whole grains, such as whole wheat, cracked wheat, brown rice, and wild rice. °Whole grains that are fortified with folic acid. This is recommended for women who are pregnant or who want to become pregnant. °Read labels and avoid the following: °Foods with a lot of added sugars. These include foods that contain brown sugar, corn sweetener, corn syrup, dextrose, fructose, glucose, high-fructose corn syrup, honey, invert sugar, lactose, malt syrup, maltose, molasses, raw sugar, sucrose, trehalose, or turbinado sugar. °Do not eat more than the following amounts of added sugar per day: °6 teaspoons (25 g) for women. °9 teaspoons (38 g) for men. °Foods that contain processed or refined starches and grains. °Refined grain products, such as white flour, degermed cornmeal, white bread, and white rice. °Shopping °Choose nutrient-rich snacks, such as vegetables, whole fruits, and nuts. Avoid high-calorie and high-sugar snacks, such as potato chips, fruit snacks, and candy. °Use oil-based dressings and spreads on foods instead of solid fats such as butter, stick margarine, or cream cheese. °Limit pre-made sauces,  mixes, and "instant" products such as flavored rice, instant noodles, and ready-made pasta. °Try more plant-protein sources, such as tofu, tempeh, black beans, edamame, lentils, nuts, and seeds. °Explore eating plans such as the Mediterranean diet or vegetarian diet. °Cooking °Use oil to sauté or stir-fry foods instead of solid fats such as butter, stick margarine, or lard. °Try baking, boiling, grilling, or broiling instead of frying. °Remove the fatty part of meats before cooking. °Steam vegetables in water or broth. °Meal planning ° °At meals, imagine dividing your plate into fourths: °One-half of your plate is fruits and vegetables. °One-fourth of your plate is whole grains. °One-fourth of your plate is protein, especially lean meats, poultry, eggs, tofu, beans, or nuts. °Include low-fat dairy as part of your daily diet. °Lifestyle °Choose healthy options in all settings, including home, work, school, restaurants, or stores. °Prepare your food safely: °Wash your hands after handling raw meats. °Keep food preparation surfaces clean by regularly washing with hot, soapy water. °Keep raw meats separate from ready-to-eat foods, such as fruits and vegetables. °Cook seafood, meat, poultry, and eggs to the recommended internal temperature. °Store foods at safe temperatures. In general: °Keep cold foods at 40°F (4.4°C) or below. °Keep hot foods at 140°F (60°C) or above. °Keep your freezer at 0°F (-17.8°C) or below. °Foods are no longer safe to eat when they have been between the temperatures of 40°-140°F (4.4-60°C) for more than 2 hours. °What foods should I eat? °Fruits °Aim to eat 2 cup-equivalents of fresh, canned (in natural juice), or frozen fruits each day. Examples of 1 cup-equivalent of fruit include 1 small apple, 8 large strawberries, 1 cup canned fruit, ½ cup dried fruit, or 1 cup 100% juice. °  Vegetables °Aim to eat 2½-3 cup-equivalents of fresh and frozen vegetables each day, including different varieties and  colors. Examples of 1 cup-equivalent of vegetables include 2 medium carrots, 2 cups raw, leafy greens, 1 cup chopped vegetable (raw or cooked), or 1 medium baked potato. °Grains °Aim to eat 6 ounce-equivalents of whole grains each day. Examples of 1 ounce-equivalent of grains include 1 slice of bread, 1 cup ready-to-eat cereal, 3 cups popcorn, or ½ cup cooked rice, pasta, or cereal. °Meats and other proteins °Aim to eat 5-6 ounce-equivalents of protein each day. Examples of 1 ounce-equivalent of protein include 1 egg, 1/2 cup nuts or seeds, or 1 tablespoon (16 g) peanut butter. A cut of meat or fish that is the size of a deck of cards is about 3-4 ounce-equivalents. °Of the protein you eat each week, try to have at least 8 ounces come from seafood. This includes salmon, trout, herring, and anchovies. °Dairy °Aim to eat 3 cup-equivalents of fat-free or low-fat dairy each day. Examples of 1 cup-equivalent of dairy include 1 cup (240 mL) milk, 8 ounces (250 g) yogurt, 1½ ounces (44 g) natural cheese, or 1 cup (240 mL) fortified soy milk. °Fats and oils °Aim for about 5 teaspoons (21 g) per day. Choose monounsaturated fats, such as canola and olive oils, avocados, peanut butter, and most nuts, or polyunsaturated fats, such as sunflower, corn, and soybean oils, walnuts, pine nuts, sesame seeds, sunflower seeds, and flaxseed. °Beverages °Aim for six 8-oz glasses of water per day. Limit coffee to three to five 8-oz cups per day. °Limit caffeinated beverages that have added calories, such as soda and energy drinks. °Limit alcohol intake to no more than 1 drink a day for nonpregnant women and 2 drinks a day for men. One drink equals 12 oz of beer (355 mL), 5 oz of wine (148 mL), or 1½ oz of hard liquor (44 mL). °Seasoning and other foods °Avoid adding excess amounts of salt to your foods. Try flavoring foods with herbs and spices instead of salt. °Avoid adding sugar to foods. °Try using oil-based dressings, sauces, and  spreads instead of solid fats. °This information is based on general [place] nutrition guidelines. For more information, visit choosemyplate.gov. Exact amounts may vary based on your nutrition needs. °Summary °A healthy eating plan may help you to maintain a healthy weight, reduce the risk of chronic diseases, and stay active throughout your life. °Plan your meals. Make sure you eat the right portions of a variety of nutrient-rich foods. °Try baking, boiling, grilling, or broiling instead of frying. °Choose healthy options in all settings, including home, work, school, restaurants, or stores. °This information is not intended to replace advice given to you by your health care provider. Make sure you discuss any questions you have with your health care provider. °Document Revised: 04/26/2021 Document Reviewed: 04/26/2021 °Elsevier Patient Education © 2022 [employer] Inc. ° °

## 2021-11-01 ENCOUNTER — Encounter: Payer: Self-pay | Admitting: Nurse Practitioner

## 2021-11-01 ENCOUNTER — Other Ambulatory Visit: Payer: Self-pay

## 2021-11-01 ENCOUNTER — Telehealth: Payer: Self-pay

## 2021-11-01 ENCOUNTER — Ambulatory Visit (INDEPENDENT_AMBULATORY_CARE_PROVIDER_SITE_OTHER): Payer: No Typology Code available for payment source | Admitting: Nurse Practitioner

## 2021-11-01 VITALS — BP 104/65 | HR 65 | Temp 98.1°F | Ht 63.0 in | Wt 156.2 lb

## 2021-11-01 DIAGNOSIS — E6609 Other obesity due to excess calories: Secondary | ICD-10-CM

## 2021-11-01 DIAGNOSIS — Z1231 Encounter for screening mammogram for malignant neoplasm of breast: Secondary | ICD-10-CM

## 2021-11-01 DIAGNOSIS — E538 Deficiency of other specified B group vitamins: Secondary | ICD-10-CM

## 2021-11-01 DIAGNOSIS — Z0000 Encounter for general adult medical examination without abnormal findings: Secondary | ICD-10-CM | POA: Diagnosis not present

## 2021-11-01 DIAGNOSIS — E7800 Pure hypercholesterolemia, unspecified: Secondary | ICD-10-CM

## 2021-11-01 DIAGNOSIS — K219 Gastro-esophageal reflux disease without esophagitis: Secondary | ICD-10-CM

## 2021-11-01 DIAGNOSIS — N393 Stress incontinence (female) (male): Secondary | ICD-10-CM | POA: Diagnosis not present

## 2021-11-01 DIAGNOSIS — J301 Allergic rhinitis due to pollen: Secondary | ICD-10-CM

## 2021-11-01 DIAGNOSIS — N3281 Overactive bladder: Secondary | ICD-10-CM | POA: Diagnosis not present

## 2021-11-01 DIAGNOSIS — F411 Generalized anxiety disorder: Secondary | ICD-10-CM

## 2021-11-01 DIAGNOSIS — Z6831 Body mass index (BMI) 31.0-31.9, adult: Secondary | ICD-10-CM

## 2021-11-01 DIAGNOSIS — R7301 Impaired fasting glucose: Secondary | ICD-10-CM

## 2021-11-01 DIAGNOSIS — N951 Menopausal and female climacteric states: Secondary | ICD-10-CM

## 2021-11-01 DIAGNOSIS — E559 Vitamin D deficiency, unspecified: Secondary | ICD-10-CM

## 2021-11-01 MED ORDER — MIRABEGRON ER 25 MG PO TB24
25.0000 mg | ORAL_TABLET | Freq: Every day | ORAL | 12 refills | Status: DC
Start: 2021-11-01 — End: 2022-02-22

## 2021-11-01 MED ORDER — TRAZODONE HCL 50 MG PO TABS: ORAL_TABLET | ORAL | 12 refills | Status: DC

## 2021-11-01 NOTE — Assessment & Plan Note (Signed)
Improved, no further Gabapentin used.

## 2021-11-01 NOTE — Assessment & Plan Note (Signed)
BMI 27.67, praised for weight loss.  Recommended eating smaller high protein, low fat meals more frequently and exercising 30 mins a day 5 times a week with a goal of 10-15lb weight loss in the next 3 months. Patient voiced their understanding and motivation to adhere to these recommendations.

## 2021-11-01 NOTE — Assessment & Plan Note (Signed)
Chronic, ongoing.  Repeat lipid panel today and focus on diet.  Fasting labs in morning.  Continue Lovaza and Crestor at this time.

## 2021-11-01 NOTE — Assessment & Plan Note (Signed)
Chronic, ongoing, mixed with stress incontinence -- symptoms worsening again with urgency + dry eyes and mouth with Ditropan presenting.  Stop Ditropan and change to Mybetriq which may offer more benefit with less side effects, will work on PA for coverage if needed due to her side effects with Ditropan.  Discussed pelvic floor physical therapy, she has not tried this, did not attend therapy that was ordered due to time constraints. UA outpatient.

## 2021-11-01 NOTE — Assessment & Plan Note (Signed)
Noted past labs, check A1c today and initiate medication as needed.  Continue diet focus at this time. 

## 2021-11-01 NOTE — Assessment & Plan Note (Signed)
Exacerbated due to stressors as caregiver, recommend starting Pepcid OTC 20 MG daily as needed and monitor.  Could consider PPI if worsening.

## 2021-11-01 NOTE — Assessment & Plan Note (Signed)
Chronic, ongoing.  Continue supplement and recheck Vitamin D level today.  May consider return to Vit D weekly if on lower side. 

## 2021-11-01 NOTE — Progress Notes (Signed)
BP 104/65    Pulse 65    Temp 98.1 F (36.7 C) (Oral)    Ht 5\' 3"  (1.6 m)    Wt 156 lb 3.2 oz (70.9 kg)    SpO2 96%    BMI 27.67 kg/m    Subjective:    Patient ID: Karen Lambert, female    DOB: 1961/06/19, 61 y.o.   MRN: [id number]  HPI: Karen Lambert is a 62 y.o. female presenting on 11/01/2021 for comprehensive medical examination. Current medical complaints include:none  She currently lives with: mother Menopausal Symptoms: yes -- hot flashes improved, has not used Gabapentin in some time  ALLERGIES Continues on Claritin and Singulair + Flonase -- takes only as needed. Duration: chronic Runny nose: none Nasal congestion: no Nasal itching: no Sneezing: no Eye swelling, itching or discharge: yes Post nasal drip: no Cough: no Sinus pressure: yes  Ear pain: none Ear pressure: none Fever: none Symptoms occur seasonally: yes Symptoms occur perenially: no Current allergy medications: as above Treatments attempted: multiple   OVERACTIVE BLADDER Taking Oxybutynin XL 10 MG every day - she reports this is not handling it like it did, is having more urgency which started two weeks ago.  Ordered physical therapy for pelvis, but she did not attend due to work schedule. She does experience dry eye with medication, using Visine once a day + gets dry mouth -- would like to see if Myrbetriq recommended in past would be covered now with new insurance.     Has one child -- had c-section.  She does drink water often -- takes this with her to bed too.  No sodas or caffeine.   Dysuria: no Urinary frequency: none Urgency: yes Small volume voids: no Symptom severity: no Urinary incontinence: none Foul odor: no Hematuria: no Abdominal pain: no Back pain: no Suprapubic pain/pressure: no Flank pain: no Fever:  no Status: worse Previous urinary tract infection: yes Recurrent urinary tract infection: no Sexual activity: Not sexually active Treatments attempted: Oxybutynin XL    HYPERLIPIDEMIA Continues on Rosuvastatin 10 MG daily and Lovaza. Hyperlipidemia status: good compliance Satisfied with current treatment?  yes Side effects:  no Medication compliance: good compliance Past cholesterol meds:  Lovaza Supplements: none Aspirin:  no The 10-year ASCVD risk score (Arnett DK, et al., 2019) is: 3%   Values used to calculate the score:     Age: 25 years     Sex: Female     Is Non-Hispanic African American: No     Diabetic: No     Tobacco smoker: No     Systolic Blood Pressure: 333 mmHg     Is BP treated: No     HDL Cholesterol: 37 mg/dL     Total Cholesterol: 235 mg/dL Chest pain:  no Coronary artery disease:  no Family history CAD:  yes Family history early CAD:  no   ANXIETY/STRESS Continues on Celexa 40 MG and Buspar 5 MG BID.  Is taking Trazodone for sleep as needed.  Does endorse some anxiety due to caring for her mother.  Having issues with heart burn recently with her stressors being increased.  Is not taking anything for this.   Duration:controlled Anxious mood: yes Excessive worrying: yes Irritability: yes Sweating: no Nausea: no Palpitations:no Hyperventilation: no Panic attacks: no Agoraphobia: no  Obscessions/compulsions: no Depressed mood: no Depression screen [facility] 2/9 11/01/2021 03/03/2021 01/19/2021 07/22/2020 12/26/2019  Decreased Interest 2 1 2 3 1   Down, Depressed, Hopeless 3 1 2 2  1  PHQ - 2 Score $Remov'5 2 4 5 2  'ONfxmo$ Altered sleeping $RemoveBeforeDE'3 2 2 2 2  'pKyXZKXvsLOqpRf$ Tired, decreased energy $RemoveBeforeDE'3 1 3 1 3  'LKLLOkqJGebjEtm$ Change in appetite 1 0 $R'3 1 1  'yW$ Feeling bad or failure about yourself  2 0 3 0 0  Trouble concentrating 3 0 2 1 0  Moving slowly or fidgety/restless 0 0 2 0 0  Suicidal thoughts 0 0 0 0 0  PHQ-9 Score $RemoveBef'17 5 19 10 8  'mxYrcFqShW$ Difficult doing work/chores Not difficult at all Not difficult at all - Somewhat difficult Somewhat difficult    Fall Risk 11/13/2018 11/27/2018 11/01/2021  Falls in the past year? 0 1 0  Was there an injury with Fall? 0 0 0  Fall Risk Category  Calculator 0 1 0  Fall Risk Category Low Low Low  Patient Fall Risk Level Low fall risk Low fall risk Low fall risk  Patient at Risk for Falls Due to - - No Fall Risks  Fall risk Follow up Falls evaluation completed Falls evaluation completed;Education provided Falls prevention discussed;Education provided    Functional Status Survey: Is the patient deaf or have difficulty hearing?: No Does the patient have difficulty seeing, even when wearing glasses/contacts?: No Does the patient have difficulty concentrating, remembering, or making decisions?: No Does the patient have difficulty walking or climbing stairs?: No Does the patient have difficulty dressing or bathing?: No Does the patient have difficulty doing errands alone such as visiting a doctor's office or shopping?: No   Past Medical History:  Past Medical History:  Diagnosis Date   Anxiety    Broken ankle    Hyperlipidemia    Overactive bladder    Skin cancer 2012    Surgical History:  Past Surgical History:  Procedure Laterality Date   ABDOMINAL HYSTERECTOMY  2010   BASAL CELL CARCINOMA EXCISION     L shoulder and R upper thigh   CESAREAN SECTION     COLONOSCOPY WITH PROPOFOL N/A 12/20/2015   Procedure: COLONOSCOPY WITH PROPOFOL;  Surgeon: Hulen Luster, MD;  Location: [facility] ENDOSCOPY;  Service: Gastroenterology;  Laterality: N/A;    Medications:  Current Outpatient Medications on File Prior to Visit  Medication Sig   busPIRone (BUSPAR) 5 MG tablet Take 1 tablet (5 mg total) by mouth 2 (two) times daily.   citalopram (CELEXA) 40 MG tablet Take 1 tablet (40 mg total) by mouth daily.   fluticasone (FLONASE) 50 MCG/ACT nasal spray Place 2 sprays into both nostrils daily.   loratadine (CLARITIN) 10 MG tablet Take 10 mg by mouth daily.   MELATONIN PO Take by mouth daily. 3 mg at bed time   montelukast (SINGULAIR) 10 MG tablet Take 1 tablet (10 mg total) by mouth at bedtime.   omega-3 acid ethyl esters (LOVAZA) 1 g capsule Take  2 capsules (2 g total) by mouth 2 (two) times daily.   rosuvastatin (CRESTOR) 10 MG tablet Take 1 tablet (10 mg total) by mouth daily.   VITAMIN D PO Take by mouth daily. 1000 mg   XIIDRA 5 % SOLN Place 1 drop into both eyes 2 (two) times daily.   No current facility-administered medications on file prior to visit.    Allergies:  Allergies  Allergen Reactions   Latex Rash and Shortness Of Breath    Social History:  Social History   Socioeconomic History   Marital status: Divorced    Spouse name: Not on file   Number of children: Not on file  Years of education: Not on file   Highest education level: Not on file  Occupational History   Not on file  Tobacco Use   Smoking status: Former   Smokeless tobacco: Never  Vaping Use   Vaping Use: Never used  Substance and Sexual Activity   Alcohol use: Yes    Alcohol/week: 1.0 standard drink    Types: 1 Glasses of wine per week   Drug use: Not Currently   Sexual activity: Not Currently  Other Topics Concern   Not on file  Social History Narrative   Not on file   Social Determinants of Health   Financial Resource Strain: Not on file  Food Insecurity: Not on file  Transportation Needs: Not on file  Physical Activity: Not on file  Stress: Not on file  Social Connections: Not on file  Intimate Partner Violence: Not on file   Social History   Tobacco Use  Smoking Status Former  Smokeless Tobacco Never   Social History   Substance and Sexual Activity  Alcohol Use Yes   Alcohol/week: 1.0 standard drink   Types: 1 Glasses of wine per week    Family History:  Family History  Problem Relation Age of Onset   Heart disease Mother    Diabetes Mother    Cataracts Mother    Dementia Mother    Cancer Father    Diabetes Father    Stroke Father    Lymphoma Brother    Diabetes Brother    Diabetes Brother    Breast cancer Paternal Aunt    Stroke Maternal Grandfather     Past medical history, surgical history,  medications, allergies, family history and social history reviewed with patient today and changes made to appropriate areas of the chart.   ROS All other ROS negative except what is listed above and in the HPI.      Objective:    BP 104/65    Pulse 65    Temp 98.1 F (36.7 C) (Oral)    Ht 5\' 3"  (1.6 m)    Wt 156 lb 3.2 oz (70.9 kg)    SpO2 96%    BMI 27.67 kg/m   Wt Readings from Last 3 Encounters:  11/01/21 156 lb 3.2 oz (70.9 kg)  03/03/21 166 lb 11.2 oz (75.6 kg)  01/19/21 166 lb 6.4 oz (75.5 kg)    Physical Exam Vitals and nursing note reviewed. Exam conducted with a chaperone present.  Constitutional:      General: She is awake. She is not in acute distress.    Appearance: She is well-developed, well-groomed and overweight. She is not ill-appearing or toxic-appearing.  HENT:     Head: Normocephalic and atraumatic.     Right Ear: Hearing, tympanic membrane, ear canal and external ear normal. No drainage.     Left Ear: Hearing, tympanic membrane, ear canal and external ear normal. No drainage.     Nose: Nose normal.     Right Sinus: No maxillary sinus tenderness or frontal sinus tenderness.     Left Sinus: No maxillary sinus tenderness or frontal sinus tenderness.     Mouth/Throat:     Mouth: Mucous membranes are moist.     Pharynx: Oropharynx is clear. Uvula midline. No pharyngeal swelling, oropharyngeal exudate or posterior oropharyngeal erythema.  Eyes:     General: Lids are normal.        Right eye: No discharge.        Left eye: No discharge.  Extraocular Movements: Extraocular movements intact.     Conjunctiva/sclera: Conjunctivae normal.     Pupils: Pupils are equal, round, and reactive to light.     Visual Fields: Right eye visual fields normal and left eye visual fields normal.  Neck:     Thyroid: No thyromegaly.     Vascular: No carotid bruit.     Trachea: Trachea normal.  Cardiovascular:     Rate and Rhythm: Normal rate and regular rhythm.     Heart  sounds: Normal heart sounds. No murmur heard.   No gallop.  Pulmonary:     Effort: Pulmonary effort is normal. No accessory muscle usage or respiratory distress.     Breath sounds: Normal breath sounds.  Chest:  Breasts:    Right: Normal.     Left: Normal.  Abdominal:     General: Bowel sounds are normal.     Palpations: Abdomen is soft. There is no hepatomegaly or splenomegaly.     Tenderness: There is no abdominal tenderness.  Musculoskeletal:        General: Normal range of motion.     Cervical back: Normal range of motion and neck supple.     Right lower leg: No edema.     Left lower leg: No edema.  Lymphadenopathy:     Head:     Right side of head: No submental, submandibular, tonsillar, preauricular or posterior auricular adenopathy.     Left side of head: No submental, submandibular, tonsillar, preauricular or posterior auricular adenopathy.     Cervical: No cervical adenopathy.     Upper Body:     Right upper body: No supraclavicular, axillary or pectoral adenopathy.     Left upper body: No supraclavicular, axillary or pectoral adenopathy.  Skin:    General: Skin is warm and dry.     Capillary Refill: Capillary refill takes less than 2 seconds.     Findings: No rash.  Neurological:     Mental Status: She is alert and oriented to person, place, and time.     Gait: Gait is intact.     Deep Tendon Reflexes: Reflexes are normal and symmetric.     Reflex Scores:      Brachioradialis reflexes are 2+ on the right side and 2+ on the left side.      Patellar reflexes are 2+ on the right side and 2+ on the left side. Psychiatric:        Attention and Perception: Attention normal.        Mood and Affect: Mood normal.        Speech: Speech normal.        Behavior: Behavior normal. Behavior is cooperative.        Thought Content: Thought content normal.        Judgment: Judgment normal.   Results for orders placed or performed in visit on 03/03/21  Comprehensive metabolic  panel  Result Value Ref Range   Glucose 113 (H) 65 - 99 mg/dL   BUN 16 6 - 24 mg/dL   Creatinine, Ser 0.89 0.57 - 1.00 mg/dL   eGFR 75 >59 mL/min/1.73   BUN/Creatinine Ratio 18 9 - 23   Sodium 140 134 - 144 mmol/L   Potassium 4.5 3.5 - 5.2 mmol/L   Chloride 101 96 - 106 mmol/L   CO2 20 20 - 29 mmol/L   Calcium 9.2 8.7 - 10.2 mg/dL   Total Protein 6.6 6.0 - 8.5 g/dL   Albumin 4.6 3.8 -  4.9 g/dL   Globulin, Total 2.0 1.5 - 4.5 g/dL   Albumin/Globulin Ratio 2.3 (H) 1.2 - 2.2   Bilirubin Total 0.2 0.0 - 1.2 mg/dL   Alkaline Phosphatase 113 44 - 121 IU/L   AST 38 0 - 40 IU/L   ALT 44 (H) 0 - 32 IU/L  Lipid Panel w/o Chol/HDL Ratio  Result Value Ref Range   Cholesterol, Total 235 (H) 100 - 199 mg/dL   Triglycerides 397 (H) 0 - 149 mg/dL   HDL 37 (L) >39 mg/dL   VLDL Cholesterol Cal 71 (H) 5 - 40 mg/dL   LDL Chol Calc (NIH) 127 (H) 0 - 99 mg/dL      Assessment & Plan:   Problem List Items Addressed This Visit       Cardiovascular and Mediastinum   Hot flashes due to menopause    Improved, no further Gabapentin used.      Relevant Orders   CBC with Differential/Platelet     Respiratory   Seasonal allergic rhinitis due to pollen    Chronic, stable, continue current medication regimen and adjust as needed.  Refills as needed.        Digestive   GERD without esophagitis    Exacerbated due to stressors as caregiver, recommend starting Pepcid OTC 20 MG daily as needed and monitor.  Could consider PPI if worsening.          Endocrine   IFG (impaired fasting glucose)    Noted past labs, check A1c today and initiate medication as needed.  Continue diet focus at this time.      Relevant Orders   Microalbumin, Urine Waived   Comprehensive metabolic panel   Bayer DCA Hb A1c Waived     Genitourinary   OAB (overactive bladder)    Chronic, ongoing, mixed with stress incontinence -- symptoms worsening again with urgency + dry eyes and mouth with Ditropan presenting.  Stop  Ditropan and change to Mybetriq which may offer more benefit with less side effects, will work on PA for coverage if needed due to her side effects with Ditropan.  Discussed pelvic floor physical therapy, she has not tried this, did not attend therapy that was ordered due to time constraints. UA outpatient.      Relevant Orders   Microalbumin, Urine Waived   Comprehensive metabolic panel   Urinalysis, Routine w reflex microscopic     Other   Generalized anxiety disorder    Chronic, ongoing.  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  Denies SI/HI.  Continue Citalopram 40 MG daily, which benefits both mood and menopausal symptoms, plus Trazodone PRN 25-50 MG at HS for short period for sleep needs, recommend she continue Buspar 5 MG BID to help with anxiety and may increase to 10 MG BID if needed in future.  Discussed various methods of meditation and relaxation techniques at home.        Relevant Medications   traZODone (DESYREL) 50 MG tablet   Other Relevant Orders   TSH   Obesity    BMI 27.67, praised for weight loss.  Recommended eating smaller high protein, low fat meals more frequently and exercising 30 mins a day 5 times a week with a goal of 10-15lb weight loss in the next 3 months. Patient voiced their understanding and motivation to adhere to these recommendations.       Pure hypercholesterolemia - Primary    Chronic, ongoing.  Repeat lipid panel today and focus on diet.  Fasting labs in morning.  Continue Lovaza and Crestor at this time.      Relevant Orders   Lipid Panel w/o Chol/HDL Ratio   Comprehensive metabolic panel   Stress incontinence in female    Refer to OAB plan of care.      Relevant Medications   mirabegron ER (MYRBETRIQ) 25 MG TB24 tablet   Other Relevant Orders   Microalbumin, Urine Waived   Comprehensive metabolic panel   Vitamin D deficiency    Chronic, ongoing.  Continue supplement and recheck Vitamin D level today.  May consider return to Vit  D weekly if on lower side.      Relevant Orders   VITAMIN D 25 Hydroxy (Vit-D Deficiency, Fractures)   Other Visit Diagnoses     B12 deficiency       History of lows reported, check today and start supplement as needed.   Relevant Orders   Vitamin B12   Encounter for screening mammogram for malignant neoplasm of breast       Mammogram ordered   Relevant Orders   MM 3D SCREEN BREAST BILATERAL   Encounter for annual physical exam       Annual physical with labs and health maintenance reviewed.        Follow up plan: Return in about 6 months (around 05/01/2022) for OAB, HLD, MOOD, GERD.   LABORATORY TESTING:  - Pap smear: not applicable  IMMUNIZATIONS:   - Tdap: Tetanus vaccination status reviewed: last tetanus booster within 10 years. - Influenza: Administered today - Pneumovax: Not applicable - Prevnar: Not applicable - COVID: Up to date - HPV: Not applicable - Shingrix vaccine: Up to date  SCREENING: -Mammogram: Ordered today  - Colonoscopy: Up to date  - Bone Density: Not applicable  -Hearing Test: Not applicable  -Spirometry: Not applicable   PATIENT COUNSELING:   Advised to take 1 mg of folate supplement per day if capable of pregnancy.   Sexuality: Discussed sexually transmitted diseases, partner selection, use of condoms, avoidance of unintended pregnancy  and contraceptive alternatives.   Advised to avoid cigarette smoking.  I discussed with the patient that most people either abstain from alcohol or drink within safe limits (<=14/week and <=4 drinks/occasion for males, <=7/weeks and <= 3 drinks/occasion for females) and that the risk for alcohol disorders and other health effects rises proportionally with the number of drinks per week and how often a drinker exceeds daily limits.  Discussed cessation/primary prevention of drug use and availability of treatment for abuse.   Diet: Encouraged to adjust caloric intake to maintain  or achieve ideal body  weight, to reduce intake of dietary saturated fat and total fat, to limit sodium intake by avoiding high sodium foods and not adding table salt, and to maintain adequate dietary potassium and calcium preferably from fresh fruits, vegetables, and low-fat dairy products.    Stressed the importance of regular exercise  Injury prevention: Discussed safety belts, safety helmets, smoke detector, smoking near bedding or upholstery.   Dental health: Discussed importance of regular tooth brushing, flossing, and dental visits.    NEXT PREVENTATIVE PHYSICAL DUE IN 1 YEAR. Return in about 6 months (around 05/01/2022) for OAB, HLD, MOOD, GERD.

## 2021-11-01 NOTE — Telephone Encounter (Signed)
Prior authorization was initiated via CoverMyMeds for prescription Myrbetriq. Patient was denied by her insurance.   KEY: [id number]

## 2021-11-01 NOTE — Assessment & Plan Note (Signed)
Refer to OAB plan of care. 

## 2021-11-01 NOTE — Assessment & Plan Note (Signed)
Chronic, ongoing.  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  Denies SI/HI.  Continue Citalopram 40 MG daily, which benefits both mood and menopausal symptoms, plus Trazodone PRN 25-50 MG at HS for short period for sleep needs, recommend she continue Buspar 5 MG BID to help with anxiety and may increase to 10 MG BID if needed in future.  Discussed various methods of meditation and relaxation techniques at home.

## 2021-11-01 NOTE — Assessment & Plan Note (Signed)
Chronic, stable, continue current medication regimen and adjust as needed.  Refills as needed. 

## 2021-11-02 ENCOUNTER — Ambulatory Visit (INDEPENDENT_AMBULATORY_CARE_PROVIDER_SITE_OTHER): Payer: No Typology Code available for payment source

## 2021-11-02 ENCOUNTER — Other Ambulatory Visit: Payer: No Typology Code available for payment source

## 2021-11-02 DIAGNOSIS — E7800 Pure hypercholesterolemia, unspecified: Secondary | ICD-10-CM

## 2021-11-02 DIAGNOSIS — N3281 Overactive bladder: Secondary | ICD-10-CM

## 2021-11-02 DIAGNOSIS — R7301 Impaired fasting glucose: Secondary | ICD-10-CM

## 2021-11-02 DIAGNOSIS — N393 Stress incontinence (female) (male): Secondary | ICD-10-CM

## 2021-11-02 DIAGNOSIS — Z23 Encounter for immunization: Secondary | ICD-10-CM

## 2021-11-02 DIAGNOSIS — N951 Menopausal and female climacteric states: Secondary | ICD-10-CM

## 2021-11-02 DIAGNOSIS — E559 Vitamin D deficiency, unspecified: Secondary | ICD-10-CM

## 2021-11-02 DIAGNOSIS — F411 Generalized anxiety disorder: Secondary | ICD-10-CM

## 2021-11-02 DIAGNOSIS — E538 Deficiency of other specified B group vitamins: Secondary | ICD-10-CM

## 2021-11-02 LAB — MICROALBUMIN, URINE WAIVED
Creatinine, Urine Waived: 200 mg/dL (ref 10–300)
Microalb, Ur Waived: 10 mg/L (ref 0–19)
Microalb/Creat Ratio: <30 mg/g (ref <30)

## 2021-11-02 LAB — BAYER DCA HB A1C WAIVED: HB A1C (BAYER DCA - WAIVED): 5.8 % — ABNORMAL HIGH (ref 4.8–5.6)

## 2021-11-02 NOTE — Progress Notes (Signed)
Contacted via [place]   Currently only urine and A1c returned.  Urine with minimal protein, great news!!  A1c continues to show prediabetic level.  The A1C is the diabetes testing we talked about, this looks at your blood sugars over the past 3 months and turns the average into a number.  Your number is 5.8%, meaning you are prediabetic.  Any number 5.7 to 6.4 is considered prediabetes and any number 6.5 or greater is considered diabetes.   I would recommend heavy focus on decreasing foods high in sugar and your intake of things like bread products, pasta, and rice.  The American Diabetes Association online has a large amount of information on diet changes to make.  We will recheck this number in 6 months to ensure you are not continuing to trend upwards and move into diabetes.  Any questions?

## 2021-11-03 ENCOUNTER — Encounter: Payer: Self-pay | Admitting: Nurse Practitioner

## 2021-11-03 LAB — COMPREHENSIVE METABOLIC PANEL
ALT: 17 IU/L (ref 0–32)
AST: 18 IU/L (ref 0–40)
Albumin/Globulin Ratio: 2 (ref 1.2–2.2)
Albumin: 4.8 g/dL (ref 3.8–4.9)
Alkaline Phosphatase: 104 IU/L (ref 44–121)
BUN/Creatinine Ratio: 16 (ref 12–28)
BUN: 14 mg/dL (ref 8–27)
Bilirubin Total: 0.3 mg/dL (ref 0.0–1.2)
CO2: 23 mmol/L (ref 20–29)
Calcium: 9.7 mg/dL (ref 8.7–10.3)
Chloride: 101 mmol/L (ref 96–106)
Creatinine, Ser: 0.86 mg/dL (ref 0.57–1.00)
Globulin, Total: 2.4 g/dL (ref 1.5–4.5)
Glucose: 100 mg/dL — ABNORMAL HIGH (ref 70–99)
Potassium: 4.5 mmol/L (ref 3.5–5.2)
Sodium: 141 mmol/L (ref 134–144)
Total Protein: 7.2 g/dL (ref 6.0–8.5)
eGFR: 77 mL/min/{1.73_m2} (ref >59)

## 2021-11-03 LAB — VITAMIN B12: Vitamin B-12: 445 pg/mL (ref 232–1245)

## 2021-11-03 LAB — LIPID PANEL W/O CHOL/HDL RATIO
Cholesterol, Total: 169 mg/dL (ref 100–199)
HDL: 53 mg/dL (ref >39)
LDL Chol Calc (NIH): 84 mg/dL (ref 0–99)
Triglycerides: 193 mg/dL — ABNORMAL HIGH (ref 0–149)
VLDL Cholesterol Cal: 32 mg/dL (ref 5–40)

## 2021-11-03 LAB — CBC WITH DIFFERENTIAL/PLATELET
Basophils Absolute: 0.1 10*3/uL (ref 0.0–0.2)
Basos: 1 %
EOS (ABSOLUTE): 0.2 10*3/uL (ref 0.0–0.4)
Eos: 3 %
Hematocrit: 39.8 % (ref 34.0–46.6)
Hemoglobin: 13.5 g/dL (ref 11.1–15.9)
Immature Grans (Abs): 0 10*3/uL (ref 0.0–0.1)
Immature Granulocytes: 0 %
Lymphocytes Absolute: 1.6 10*3/uL (ref 0.7–3.1)
Lymphs: 28 %
MCH: 31 pg (ref 26.6–33.0)
MCHC: 33.9 g/dL (ref 31.5–35.7)
MCV: 92 fL (ref 79–97)
Monocytes Absolute: 0.4 10*3/uL (ref 0.1–0.9)
Monocytes: 8 %
Neutrophils Absolute: 3.5 10*3/uL (ref 1.4–7.0)
Neutrophils: 60 %
Platelets: 169 10*3/uL (ref 150–450)
RBC: 4.35 x10E6/uL (ref 3.77–5.28)
RDW: 12.3 % (ref 11.7–15.4)
WBC: 5.8 10*3/uL (ref 3.4–10.8)

## 2021-11-03 LAB — TSH: TSH: 2.16 u[IU]/mL (ref 0.450–4.500)

## 2021-11-03 LAB — VITAMIN D 25 HYDROXY (VIT D DEFICIENCY, FRACTURES): Vit D, 25-Hydroxy: 40.8 ng/mL (ref 30.0–100.0)

## 2021-11-03 NOTE — Progress Notes (Signed)
Contacted via MyChart   Good afternoon Cyrilla, your labs have returned and overall remain stable.  Cholesterol levels are much improved, continue Rosuvastatin daily.  Insurance is not Associate [employer].  I am going to write a letter to try to appeal this decision due to your current effects with Ditropan.  We will see what we can do.  Any questions? Keep being stellar!!  Thank you for allowing me to participate in your care.  I appreciate you. Kindest regards, Rayli Wiederhold

## 2021-11-03 NOTE — Progress Notes (Signed)
Please send Mybetriq appeal letter to assess for coverage.  Letter on back printer.

## 2022-02-14 ENCOUNTER — Other Ambulatory Visit: Payer: Self-pay | Admitting: Nurse Practitioner

## 2022-02-14 NOTE — Telephone Encounter (Signed)
Requested medication (s) are due for refill today:   Provider to review  Requested medication (s) are on the active medication list:   Yes  Future visit scheduled:   Yes   Last ordered: Discontinued 04/14/2021 because the course was completed.  Pt returned call and said she did want this refilled.  Provider to review   Requested Prescriptions  Pending Prescriptions Disp Refills   oxybutynin (DITROPAN-XL) 10 MG 24 hr tablet [Pharmacy Med Name: OXYBUTYNIN ER '10MG'$  TABLETS] 90 tablet 4    Sig: TAKE 1 TABLET(10 MG) BY MOUTH AT BEDTIME     Urology:  Bladder Agents Passed - 02/14/2022  1:37 PM      Passed - Valid encounter within last 12 months    Recent Outpatient Visits           3 months ago Pure hypercholesterolemia   [place], Henrine Screws T, NP   11 months ago Generalized anxiety disorder   [place], [place] T, NP   1 year ago Pure hypercholesterolemia   [place], Barbaraann Faster, NP   1 year ago Pure hypercholesterolemia   [place], [place] T, NP   1 year ago Sinus pressure   [place], Barbaraann Faster, NP       Future Appointments             In 2 months Cann[place], Barbaraann Faster, NP [facility]

## 2022-02-14 NOTE — Telephone Encounter (Signed)
Pt called in returning the call, stating she is wanting to get this medication refilled, please advise.

## 2022-02-14 NOTE — Telephone Encounter (Signed)
Prescription (previously discontinued because the course was completed) received . Called pt to see if this a request for refill. Left message on voice mail to call back and advise.

## 2022-02-21 ENCOUNTER — Encounter: Payer: Self-pay | Admitting: Nurse Practitioner

## 2022-02-22 MED ORDER — OXYBUTYNIN CHLORIDE ER 10 MG PO TB24: 10.0000 mg | ORAL_TABLET | Freq: Every day | ORAL | 4 refills | Status: DC

## 2022-03-22 ENCOUNTER — Other Ambulatory Visit: Payer: Self-pay | Admitting: Nurse Practitioner

## 2022-03-22 NOTE — Telephone Encounter (Signed)
Requested Prescriptions  Pending Prescriptions Disp Refills  . rosuvastatin (CRESTOR) 10 MG tablet [Pharmacy Med Name: ROSUVASTATIN '10MG'$  TABLETS] 90 tablet 0    Sig: TAKE 1 TABLET(10 MG) BY MOUTH DAILY     Cardiovascular:  Antilipid - Statins 2 Failed - 03/22/2022  3:11 AM      Failed - Lipid Panel in normal range within the last 12 months    Cholesterol, Total  Date Value Ref Range Status  11/02/2021 169 100 - 199 mg/dL Final   LDL Chol Calc (NIH)  Date Value Ref Range Status  11/02/2021 84 0 - 99 mg/dL Final   HDL  Date Value Ref Range Status  11/02/2021 53 >39 mg/dL Final   Triglycerides  Date Value Ref Range Status  11/02/2021 193 (H) 0 - 149 mg/dL Final         Passed - Cr in normal range and within 360 days    Creatinine, Ser  Date Value Ref Range Status  11/02/2021 0.86 0.57 - 1.00 mg/dL Final         Passed - Patient is not pregnant      Passed - Valid encounter within last 12 months    Recent Outpatient Visits          4 months ago Pure hypercholesterolemia   [place], Barbaraann Faster, NP   1 year ago Generalized anxiety disorder   [place], [place] T, NP   1 year ago Pure hypercholesterolemia   [place], [place] T, NP   1 year ago Pure hypercholesterolemia   [place], [place] T, NP   1 year ago Sinus pressure   [place], Barbaraann Faster, NP      Future Appointments            In 1 month Cannady, Barbaraann Faster, NP [facility]

## 2022-04-29 NOTE — Patient Instructions (Incomplete)

## 2022-05-02 ENCOUNTER — Encounter: Payer: Self-pay | Admitting: Nurse Practitioner

## 2022-05-02 ENCOUNTER — Ambulatory Visit (INDEPENDENT_AMBULATORY_CARE_PROVIDER_SITE_OTHER): Payer: Self-pay | Admitting: Nurse Practitioner

## 2022-05-02 VITALS — BP 132/76 | HR 74 | Temp 98.1°F | Ht 63.0 in | Wt 158.2 lb

## 2022-05-02 DIAGNOSIS — F411 Generalized anxiety disorder: Secondary | ICD-10-CM

## 2022-05-02 DIAGNOSIS — K219 Gastro-esophageal reflux disease without esophagitis: Secondary | ICD-10-CM

## 2022-05-02 DIAGNOSIS — Z6831 Body mass index (BMI) 31.0-31.9, adult: Secondary | ICD-10-CM

## 2022-05-02 DIAGNOSIS — E6609 Other obesity due to excess calories: Secondary | ICD-10-CM

## 2022-05-02 DIAGNOSIS — E7800 Pure hypercholesterolemia, unspecified: Secondary | ICD-10-CM

## 2022-05-02 DIAGNOSIS — R7301 Impaired fasting glucose: Secondary | ICD-10-CM

## 2022-05-02 DIAGNOSIS — N393 Stress incontinence (female) (male): Secondary | ICD-10-CM

## 2022-05-02 DIAGNOSIS — N3281 Overactive bladder: Secondary | ICD-10-CM

## 2022-05-02 MED ORDER — MONTELUKAST SODIUM 10 MG PO TABS
10.0000 mg | ORAL_TABLET | Freq: Every day | ORAL | 4 refills | Status: DC
Start: 2022-05-02 — End: 2023-02-06

## 2022-05-02 MED ORDER — BUSPIRONE HCL 5 MG PO TABS
5.0000 mg | ORAL_TABLET | Freq: Two times a day (BID) | ORAL | 4 refills | Status: DC
Start: 2022-05-02 — End: 2022-05-26

## 2022-05-02 MED ORDER — TRAZODONE HCL 50 MG PO TABS: ORAL_TABLET | ORAL | 12 refills | Status: DC

## 2022-05-02 MED ORDER — OXYBUTYNIN CHLORIDE ER 10 MG PO TB24
10.0000 mg | ORAL_TABLET | Freq: Every day | ORAL | 4 refills | Status: DC
Start: 2022-05-02 — End: 2023-02-06

## 2022-05-02 MED ORDER — ROSUVASTATIN CALCIUM 10 MG PO TABS: ORAL_TABLET | ORAL | 4 refills | Status: DC

## 2022-05-02 MED ORDER — CITALOPRAM HYDROBROMIDE 40 MG PO TABS
40.0000 mg | ORAL_TABLET | Freq: Every day | ORAL | 4 refills | Status: DC
Start: 2022-05-02 — End: 2022-11-02

## 2022-05-02 NOTE — Assessment & Plan Note (Signed)
Refer to OAB plan of care. 

## 2022-05-02 NOTE — Assessment & Plan Note (Signed)
Chronic, exacerbated.  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  Denies SI/HI.  Continue Citalopram 40 MG daily, which benefits both mood and menopausal symptoms, plus Trazodone PRN 25-50 MG at HS for short period for sleep needs, recommend she continue Buspar 5 MG BID to help with anxiety and may increase to 10 MG BID if needed in future.  Discussed various methods of meditation and relaxation techniques at home.  Recommend she get palliative referral for her mother and looking in hospice caregiver support group.

## 2022-05-02 NOTE — Assessment & Plan Note (Signed)
Chronic, stable.  Continue Pepcid as needed at home which is offering her benefit.  If any worsening alert provider.

## 2022-05-02 NOTE — Assessment & Plan Note (Signed)
Noted past labs, check A1c today and initiate medication as needed.  Continue diet focus at this time.

## 2022-05-02 NOTE — Progress Notes (Signed)
BP 132/76   Pulse 74   Temp 98.1 F (36.7 C) (Oral)   Ht 5\' 3"  (1.6 m)   Wt 158 lb 3.2 oz (71.8 kg)   SpO2 97%   BMI 28.02 kg/m    Subjective:    Patient ID: Karen Lambert, female    DOB: April 03, 1961, 61 y.o.   MRN: [id number]  HPI: Karen Lambert is a 61 y.o. female  Chief Complaint  Patient presents with   Over Active Bladder   Hyperlipidemia   Gastroesophageal Reflux   Mood   Stress    Patient says her stress levels have increased since starting a new job working from home and also taking care of her mother.    Waist 38 inches  GERD Continues on Pepcid as needed. GERD control status: stable Satisfied with current treatment? yes Heartburn frequency: occasional Medication side effects: no  Medication compliance: stable Dysphagia: no Odynophagia:  no Hematemesis: no Blood in stool: no EGD: yes   OAB Continues on Ditropan XL 10 MG QHS.  Myrbetriq was tried in past without as much benefit and cost was high. Dysuria: no Urinary frequency: no Urgency: no Small volume voids: no Symptom severity: no Urinary incontinence: no Foul odor: no Hematuria: no Abdominal pain: no Back pain: no Suprapubic pain/pressure: no Flank pain: no Fever:  no Vomiting: no Treatments attempted: Ditropan and Myrbetriq, pelvic floor exercises    HYPERLIPIDEMIA Continues on Rosuvastatin and Lovaza. Hyperlipidemia status: good compliance Satisfied with current treatment?  yes Side effects:  no Medication compliance: good compliance Past cholesterol meds:  Lovaza Supplements: none Aspirin:  no The 10-year ASCVD risk score (Arnett DK, et al., 2019) is: 3.1%   Values used to calculate the score:     Age: 34 years     Sex: Female     Is Non-Hispanic African American: No     Diabetic: No     Tobacco smoker: No     Systolic Blood Pressure: 373 mmHg     Is BP treated: No     HDL Cholesterol: 53 mg/dL     Total Cholesterol: 169 mg/dL Chest pain:  no Coronary artery disease:   no Family history CAD:  yes Family history early CAD:  no  ANXIETY/STRESS Continues on Celexa 40 MG and Buspar 5 MG (not taking twice a day at this time).  Is taking Trazodone for sleep as needed.  Having increased anxiety due to being caregiver for her mother who has dementia -- she is working from home at this time (new position) and caregiver for her mother.  Sister-in-law comes over to try to assist. Duration:controlled Anxious mood: yes Excessive worrying: yes Irritability: yes Sweating: no Nausea: no Palpitations:no Hyperventilation: no Panic attacks: no Agoraphobia: no  Obscessions/compulsions: no Depressed mood: no    05/02/2022    8:26 AM 11/01/2021    4:39 PM 03/03/2021    9:03 AM 01/19/2021    8:52 AM 07/22/2020    8:14 AM  Depression screen PHQ 2/9  Decreased Interest 0 2 1 2 3   Down, Depressed, Hopeless 1 3 1 2 2   PHQ - 2 Score 1 5 2 4 5   Altered sleeping 3 3 2 2 2   Tired, decreased energy 2 3 1 3 1   Change in appetite 2 1 0 3 1  Feeling bad or failure about yourself  3 2 0 3 0  Trouble concentrating 3 3 0 2 1  Moving slowly or fidgety/restless 0 0 0 2  0  Suicidal thoughts 0 0 0 0 0  PHQ-9 Score $RemoveBef'14 17 5 19 10  '[id number]$ Difficult doing work/chores Somewhat difficult Not difficult at all Not difficult at all  Somewhat difficult  Anhedonia: no Weight changes: no Insomnia: occasional Hypersomnia: no Fatigue/loss of energy: no Feelings of worthlessness: no Feelings of guilt: no Impaired concentration/indecisiveness: yes Suicidal ideations: no  Crying spells: no Recent Stressors/Life Changes: no   Relationship problems: no   Family stress: no     Financial stress: no    Job stress: no    Recent death/loss: no     June 01, 2022    8:27 AM 11/01/2021    4:42 PM 03/03/2021    8:48 AM 01/19/2021    9:06 AM  GAD 7 : Generalized Anxiety Score  Nervous, Anxious, on Edge 3 0 3 3  Control/stop worrying 3 0 3 3  Worry too much - different things 3 0 3 3  Trouble relaxing 3 0  3 3  Restless 2 0 2 2  Easily annoyed or irritable 3 0 3 3  Afraid - awful might happen 3 0 3 3  Total GAD 7 Score 20 0 20 20  Anxiety Difficulty Very difficult Not difficult at all  Somewhat difficult   Relevant past medical, surgical, family and social history reviewed and updated as indicated. Interim medical history since our last visit reviewed. Allergies and medications reviewed and updated.  Review of Systems  Constitutional:  Negative for activity change, appetite change, diaphoresis, fatigue and fever.  Respiratory:  Negative for cough, chest tightness and shortness of breath.   Cardiovascular:  Negative for chest pain, palpitations and leg swelling.  Gastrointestinal: Negative.   Genitourinary:  Negative for decreased urine volume, dysuria, frequency, hematuria, pelvic pain and urgency.  Neurological: Negative.   Psychiatric/Behavioral:  Positive for decreased concentration and sleep disturbance. Negative for self-injury and suicidal ideas. The patient is nervous/anxious.     Per HPI unless specifically indicated above     Objective:    BP 132/76   Pulse 74   Temp 98.1 F (36.7 C) (Oral)   Ht $R'5\' 3"'DU$  (1.6 m)   Wt 158 lb 3.2 oz (71.8 kg)   SpO2 97%   BMI 28.02 kg/m   Wt Readings from Last 3 Encounters:  06-01-2022 158 lb 3.2 oz (71.8 kg)  11/01/21 156 lb 3.2 oz (70.9 kg)  03/03/21 166 lb 11.2 oz (75.6 kg)    Physical Exam Vitals and nursing note reviewed.  Constitutional:      General: She is awake. She is not in acute distress.    Appearance: She is well-developed. She is not ill-appearing.  HENT:     Head: Normocephalic.     Right Ear: Hearing normal.     Left Ear: Hearing normal.  Eyes:     General: Lids are normal.        Right eye: No discharge.        Left eye: No discharge.     Conjunctiva/sclera: Conjunctivae normal.     Pupils: Pupils are equal, round, and reactive to light.  Neck:     Vascular: No carotid bruit.  Cardiovascular:     Rate and  Rhythm: Normal rate and regular rhythm.     Heart sounds: Normal heart sounds. No murmur heard.    No gallop.  Pulmonary:     Effort: Pulmonary effort is normal. No accessory muscle usage or respiratory distress.     Breath sounds: Normal breath sounds.  Abdominal:     General: Bowel sounds are normal.     Palpations: Abdomen is soft.  Musculoskeletal:     Cervical back: Normal range of motion and neck supple.     Right lower leg: No edema.     Left lower leg: No edema.  Skin:    General: Skin is warm and dry.  Neurological:     Mental Status: She is alert and oriented to person, place, and time.  Psychiatric:        Attention and Perception: Attention normal.        Mood and Affect: Mood normal.        Behavior: Behavior normal. Behavior is cooperative.        Thought Content: Thought content normal.        Judgment: Judgment normal.     Results for orders placed or performed in visit on 11/02/21  Bayer DCA Hb A1c Waived  Result Value Ref Range   HB A1C (BAYER DCA - WAIVED) 5.8 (H) 4.8 - 5.6 %  CBC with Differential/Platelet  Result Value Ref Range   WBC 5.8 3.4 - 10.8 x10E3/uL   RBC 4.35 3.77 - 5.28 x10E6/uL   Hemoglobin 13.5 11.1 - 15.9 g/dL   Hematocrit 39.8 34.0 - 46.6 %   MCV 92 79 - 97 fL   MCH 31.0 26.6 - 33.0 pg   MCHC 33.9 31.5 - 35.7 g/dL   RDW 12.3 11.7 - 15.4 %   Platelets 169 150 - 450 x10E3/uL   Neutrophils 60 Not Estab. %   Lymphs 28 Not Estab. %   Monocytes 8 Not Estab. %   Eos 3 Not Estab. %   Basos 1 Not Estab. %   Neutrophils Absolute 3.5 1.4 - 7.0 x10E3/uL   Lymphocytes Absolute 1.6 0.7 - 3.1 x10E3/uL   Monocytes Absolute 0.4 0.1 - 0.9 x10E3/uL   EOS (ABSOLUTE) 0.2 0.0 - 0.4 x10E3/uL   Basophils Absolute 0.1 0.0 - 0.2 x10E3/uL   Immature Granulocytes 0 Not Estab. %   Immature Grans (Abs) 0.0 0.0 - 0.1 x10E3/uL  Comprehensive metabolic panel  Result Value Ref Range   Glucose 100 (H) 70 - 99 mg/dL   BUN 14 8 - 27 mg/dL   Creatinine, Ser 0.86  0.57 - 1.00 mg/dL   eGFR 77 >59 mL/min/1.73   BUN/Creatinine Ratio 16 12 - 28   Sodium 141 134 - 144 mmol/L   Potassium 4.5 3.5 - 5.2 mmol/L   Chloride 101 96 - 106 mmol/L   CO2 23 20 - 29 mmol/L   Calcium 9.7 8.7 - 10.3 mg/dL   Total Protein 7.2 6.0 - 8.5 g/dL   Albumin 4.8 3.8 - 4.9 g/dL   Globulin, Total 2.4 1.5 - 4.5 g/dL   Albumin/Globulin Ratio 2.0 1.2 - 2.2   Bilirubin Total 0.3 0.0 - 1.2 mg/dL   Alkaline Phosphatase 104 44 - 121 IU/L   AST 18 0 - 40 IU/L   ALT 17 0 - 32 IU/L  Lipid Panel w/o Chol/HDL Ratio  Result Value Ref Range   Cholesterol, Total 169 100 - 199 mg/dL   Triglycerides 193 (H) 0 - 149 mg/dL   HDL 53 >39 mg/dL   VLDL Cholesterol Cal 32 5 - 40 mg/dL   LDL Chol Calc (NIH) 84 0 - 99 mg/dL  Microalbumin, Urine Waived  Result Value Ref Range   Microalb, Ur Waived 10 0 - 19 mg/L   Creatinine, Urine Waived 200 10 -  300 mg/dL   Microalb/Creat Ratio <30 <30 mg/g  TSH  Result Value Ref Range   TSH 2.160 0.450 - 4.500 uIU/mL  Vitamin B12  Result Value Ref Range   Vitamin B-12 445 232 - 1,245 pg/mL  VITAMIN D 25 Hydroxy (Vit-D Deficiency, Fractures)  Result Value Ref Range   Vit D, 25-Hydroxy 40.8 30.0 - 100.0 ng/mL      Assessment & Plan:   Problem List Items Addressed This Visit       Digestive   GERD without esophagitis    Chronic, stable.  Continue Pepcid as needed at home which is offering her benefit.  If any worsening alert provider.        Endocrine   IFG (impaired fasting glucose)    Noted past labs, check A1c today and initiate medication as needed.  Continue diet focus at this time.      Relevant Orders   HgB A1c     Genitourinary   OAB (overactive bladder)    Chronic, ongoing, mixed with stress incontinence.  Continue Ditropan daily as offering her benefit.  Discussed pelvic floor physical therapy, she has not tried this, did not attend therapy that was ordered due to time constraints. Recommend she perform exercises at home.       Relevant Orders   Comprehensive metabolic panel     Other   Generalized anxiety disorder - Primary    Chronic, exacerbated.  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  Denies SI/HI.  Continue Citalopram 40 MG daily, which benefits both mood and menopausal symptoms, plus Trazodone PRN 25-50 MG at HS for short period for sleep needs, recommend she continue Buspar 5 MG BID to help with anxiety and may increase to 10 MG BID if needed in future.  Discussed various methods of meditation and relaxation techniques at home.  Recommend she get palliative referral for her mother and looking in hospice caregiver support group.      Relevant Medications   citalopram (CELEXA) 40 MG tablet   busPIRone (BUSPAR) 5 MG tablet   traZODone (DESYREL) 50 MG tablet   Obesity    BMI 28.02, praised for maintaining weight loss.  Recommended eating smaller high protein, low fat meals more frequently and exercising 30 mins a day 5 times a week with a goal of 10-15lb weight loss in the next 3 months. Patient voiced their understanding and motivation to adhere to these recommendations.       Pure hypercholesterolemia    Chronic, ongoing.  Repeat lipid panel today and focus on diet.  Fasting labs.  Continue Lovaza and Crestor at this time.      Relevant Medications   rosuvastatin (CRESTOR) 10 MG tablet   Other Relevant Orders   Comprehensive metabolic panel   Lipid Panel w/o Chol/HDL Ratio   Stress incontinence in female    Refer to OAB plan of care.      Relevant Medications   oxybutynin (DITROPAN-XL) 10 MG 24 hr tablet     Follow up plan: Return in about 6 months (around 11/02/2022) for Annual physical due after 11/01/22.

## 2022-05-02 NOTE — Assessment & Plan Note (Signed)
Chronic, ongoing, mixed with stress incontinence.  Continue Ditropan daily as offering her benefit.  Discussed pelvic floor physical therapy, she has not tried this, did not attend therapy that was ordered due to time constraints. Recommend she perform exercises at home. 

## 2022-05-02 NOTE — Assessment & Plan Note (Signed)
Chronic, ongoing.  Repeat lipid panel today and focus on diet.  Fasting labs.  Continue Lovaza and Crestor at this time. 

## 2022-05-02 NOTE — Assessment & Plan Note (Signed)
BMI 28.02, praised for maintaining weight loss.  Recommended eating smaller high protein, low fat meals more frequently and exercising 30 mins a day 5 times a week with a goal of 10-15lb weight loss in the next 3 months. Patient voiced their understanding and motivation to adhere to these recommendations.

## 2022-05-03 ENCOUNTER — Other Ambulatory Visit: Payer: Self-pay | Admitting: Nurse Practitioner

## 2022-05-03 ENCOUNTER — Encounter: Payer: Self-pay | Admitting: Nurse Practitioner

## 2022-05-03 LAB — COMPREHENSIVE METABOLIC PANEL
ALT: 26 IU/L (ref 0–32)
AST: 21 IU/L (ref 0–40)
Albumin/Globulin Ratio: 2.2 (ref 1.2–2.2)
Albumin: 5 g/dL — ABNORMAL HIGH (ref 3.8–4.9)
Alkaline Phosphatase: 121 IU/L (ref 44–121)
BUN/Creatinine Ratio: 11 — ABNORMAL LOW (ref 12–28)
BUN: 9 mg/dL (ref 8–27)
Bilirubin Total: 0.3 mg/dL (ref 0.0–1.2)
CO2: 21 mmol/L (ref 20–29)
Calcium: 10 mg/dL (ref 8.7–10.3)
Chloride: 99 mmol/L (ref 96–106)
Creatinine, Ser: 0.83 mg/dL (ref 0.57–1.00)
Globulin, Total: 2.3 g/dL (ref 1.5–4.5)
Glucose: 131 mg/dL — ABNORMAL HIGH (ref 70–99)
Potassium: 4.4 mmol/L (ref 3.5–5.2)
Sodium: 138 mmol/L (ref 134–144)
Total Protein: 7.3 g/dL (ref 6.0–8.5)
eGFR: 81 mL/min/{1.73_m2} (ref >59)

## 2022-05-03 LAB — HEMOGLOBIN A1C
Est. average glucose Bld gHb Est-mCnc: 140 mg/dL
Hgb A1c MFr Bld: 6.5 % — ABNORMAL HIGH (ref 4.8–5.6)

## 2022-05-03 LAB — LIPID PANEL W/O CHOL/HDL RATIO
Cholesterol, Total: 195 mg/dL (ref 100–199)
HDL: 52 mg/dL (ref >39)
LDL Chol Calc (NIH): 94 mg/dL (ref 0–99)
Triglycerides: 293 mg/dL — ABNORMAL HIGH (ref 0–149)
VLDL Cholesterol Cal: 49 mg/dL — ABNORMAL HIGH (ref 5–40)

## 2022-05-03 MED ORDER — ROSUVASTATIN CALCIUM 20 MG PO TABS: 20.0000 mg | ORAL_TABLET | Freq: Every day | ORAL | 3 refills | Status: DC

## 2022-05-03 NOTE — Progress Notes (Signed)
Contacted via MyChart   Good evening Malgorzata, your labs have returned: - Kidney function, creatinine and eGFR, remains normal, as is liver function, AST and ALT.  - Cholesterol levels are above goal some, I do recommend we go up to 20 MG on Rosuvastatin and will send this in.  Would like to see LDL <70.   - A1c has crept up to diabetic level, just right at it.  Any number 6.5% or greater is considered diabetes.  We have two options: We could consider starting some medication for this (like Metformin 500 MG twice a day) OR until next visit you can work on diet changes and regular exercise.  If is still elevated at next visit then would recommend medication.  What are your thoughts on this?  Let me know.  If you decide on diet only, then I would want you to let me know right away if any increase in urination, thirst, or appetite before next visit as that could mean levels are increasing. Keep being amazing!!  Thank you for allowing me to participate in your care.  I appreciate you. Kindest regards, Adanely Reynoso

## 2022-05-12 MED ORDER — OZEMPIC (0.25 OR 0.5 MG/DOSE) 2 MG/1.5ML ~~LOC~~ SOPN: PEN_INJECTOR | SUBCUTANEOUS | 3 refills | Status: DC

## 2022-05-16 NOTE — Telephone Encounter (Signed)
Appointment has been made

## 2022-05-17 ENCOUNTER — Telehealth: Payer: Self-pay | Admitting: Physician Assistant

## 2022-05-17 ENCOUNTER — Ambulatory Visit: Payer: Self-pay

## 2022-05-17 ENCOUNTER — Encounter: Payer: Self-pay | Admitting: Nurse Practitioner

## 2022-05-17 DIAGNOSIS — K047 Periapical abscess without sinus: Secondary | ICD-10-CM

## 2022-05-17 MED ORDER — IBUPROFEN 600 MG PO TABS: 600.0000 mg | ORAL_TABLET | Freq: Three times a day (TID) | ORAL | 0 refills | Status: DC | PRN

## 2022-05-17 MED ORDER — AMOXICILLIN 500 MG PO CAPS: 500.0000 mg | ORAL_CAPSULE | Freq: Three times a day (TID) | ORAL | 0 refills | Status: AC

## 2022-05-17 MED ORDER — ONDANSETRON HCL 4 MG PO TABS: 4.0000 mg | ORAL_TABLET | Freq: Three times a day (TID) | ORAL | 0 refills | Status: DC | PRN

## 2022-05-17 NOTE — Progress Notes (Signed)
Virtual Visit Consent   Karen Lambert, you are scheduled for a virtual visit with a [facility] provider today. Just as with appointments in the office, your consent must be obtained to participate. Your consent will be active for this visit and any virtual visit you may have with one of our providers in the next 365 days. If you have a MyChart account, a copy of this consent can be sent to you electronically.  As this is a virtual visit, video technology does not allow for your provider to perform a traditional examination. This may limit your provider's ability to fully assess your condition. If your provider identifies any concerns that need to be evaluated in person or the need to arrange testing (such as labs, EKG, etc.), we will make arrangements to do so. Although advances in technology are sophisticated, we cannot ensure that it will always work on either your end or our end. If the connection with a video visit is poor, the visit may have to be switched to a telephone visit. With either a video or telephone visit, we are not always able to ensure that we have a secure connection.  By engaging in this virtual visit, you consent to the provision of healthcare and authorize for your insurance to be billed (if applicable) for the services provided during this visit. Depending on your insurance coverage, you may receive a charge related to this service.  I need to obtain your verbal consent now. Are you willing to proceed with your visit today? Karen Lambert has provided verbal consent on 05/17/2022 for a virtual visit (video or telephone). Mar Daring, PA-C  Date: 05/17/2022 9:23 AM  Virtual Visit via Video Note   I, Mar Daring, connected with  Karen Lambert  ([id number], Oct 12, 1960) on 05/17/22 at  9:15 AM EDT by a video-enabled telemedicine application and verified that I am speaking with the correct person using two identifiers.  Location: Patient: Virtual Visit Location  Patient: Home Provider: Virtual Visit Location Provider: Home Office   I discussed the limitations of evaluation and management by telemedicine and the availability of in person appointments. The patient expressed understanding and agreed to proceed.    History of Present Illness: Karen Lambert is a 60 y.o. who identifies as a female who was assigned female at birth, and is being seen today for dental issue.  HPI: Dental Pain  This is a new problem. The current episode started yesterday. The problem occurs constantly. The problem has been gradually worsening. The pain is moderate. Associated symptoms include facial pain, sinus pressure and thermal sensitivity. Pertinent negatives include no difficulty swallowing or fever. Associated symptoms comments: Nausea, warmth in cheek, redness, swelling in cheek . She has tried acetaminophen, heat and ice for the symptoms. The treatment provided no relief.      Problems:  Patient Active Problem List   Diagnosis Date Noted   GERD without esophagitis 11/01/2021   Stress incontinence in female 01/19/2021   IFG (impaired fasting glucose) 01/15/2021   Snoring 01/19/2020   Hot flashes due to menopause 05/30/2019   Obesity 11/13/2018   History of abnormal mammogram 11/12/2018   OAB (overactive bladder) 01/17/2018   Seasonal allergic rhinitis due to pollen 01/17/2018   Vitamin D deficiency 01/17/2018   Generalized anxiety disorder 10/22/2015   Pure hypercholesterolemia 10/22/2015    Allergies:  Allergies  Allergen Reactions   Latex Rash and Shortness Of Breath   Medications:  Current Outpatient Medications:  amoxicillin (AMOXIL) 500 MG capsule, Take 1 capsule (500 mg total) by mouth 3 (three) times daily for 10 days., Disp: 30 capsule, Rfl: 0   ibuprofen (ADVIL) 600 MG tablet, Take 1 tablet (600 mg total) by mouth every 8 (eight) hours as needed., Disp: 30 tablet, Rfl: 0   ondansetron (ZOFRAN) 4 MG tablet, Take 1 tablet (4 mg total) by mouth  every 8 (eight) hours as needed for nausea or vomiting., Disp: 20 tablet, Rfl: 0   busPIRone (BUSPAR) 5 MG tablet, Take 1 tablet (5 mg total) by mouth 2 (two) times daily., Disp: 180 tablet, Rfl: 4   citalopram (CELEXA) 40 MG tablet, Take 1 tablet (40 mg total) by mouth daily., Disp: 90 tablet, Rfl: 4   fluticasone (FLONASE) 50 MCG/ACT nasal spray, Place 2 sprays into both nostrils daily., Disp: 16 g, Rfl: 6   loratadine (CLARITIN) 10 MG tablet, Take 10 mg by mouth daily., Disp: , Rfl:    MELATONIN PO, Take by mouth daily. 3 mg at bed time, Disp: , Rfl:    montelukast (SINGULAIR) 10 MG tablet, Take 1 tablet (10 mg total) by mouth at bedtime., Disp: 90 tablet, Rfl: 4   omega-3 acid ethyl esters (LOVAZA) 1 g capsule, Take 2 capsules (2 g total) by mouth 2 (two) times daily., Disp: 360 capsule, Rfl: 4   oxybutynin (DITROPAN-XL) 10 MG 24 hr tablet, Take 1 tablet (10 mg total) by mouth at bedtime., Disp: 90 tablet, Rfl: 4   rosuvastatin (CRESTOR) 20 MG tablet, Take 1 tablet (20 mg total) by mouth daily., Disp: 90 tablet, Rfl: 3   Semaglutide,0.25 or 0.'5MG'$ /DOS, (OZEMPIC, 0.25 OR 0.5 MG/DOSE,) 2 MG/1.5ML SOPN, Start with 0.25 MG into the skin once a week x 4 weeks, then increase to 0.5 MG into the skin weekly., Disp: 1.5 mL, Rfl: 3   traZODone (DESYREL) 50 MG tablet, TAKE 1/2 TO 1 TABLET(25 TO 50 MG) BY MOUTH AT BEDTIME AS NEEDED FOR SLEEP, Disp: 30 tablet, Rfl: 12   VITAMIN D PO, Take by mouth daily. 1000 mg, Disp: , Rfl:    XIIDRA 5 % SOLN, Place 1 drop into both eyes 2 (two) times daily., Disp: , Rfl:   Observations/Objective: Patient is well-developed, well-nourished in no acute distress.  Resting comfortably at home.  Head is normocephalic, atraumatic.  No labored breathing.  Speech is clear and coherent with logical content.  Patient is alert and oriented at baseline.  Left cheek is swollen and red. Patient reports tender and warm to touch  Assessment and Plan: 1. Dental abscess -  amoxicillin (AMOXIL) 500 MG capsule; Take 1 capsule (500 mg total) by mouth 3 (three) times daily for 10 days.  Dispense: 30 capsule; Refill: 0 - ibuprofen (ADVIL) 600 MG tablet; Take 1 tablet (600 mg total) by mouth every 8 (eight) hours as needed.  Dispense: 30 tablet; Refill: 0 - ondansetron (ZOFRAN) 4 MG tablet; Take 1 tablet (4 mg total) by mouth every 8 (eight) hours as needed for nausea or vomiting.  Dispense: 20 tablet; Refill: 0  - Suspected dental abscess - Amoxicillin and ibuprofen prescribed - Zofran for nausea - Can use ice on outside jaw/cheek for swelling - Can also take tylenol for pain with other medications - Schedule a follow with a dentist as soon as possible (Can contact [facility] or [facility] [[phone]] associated with [facility] if underinsured or uninsured) - Seek in person evaluation if symptoms fail to improve or  if they worsen   Follow Up Instructions: I discussed the assessment and treatment plan with the patient. The patient was provided an opportunity to ask questions and all were answered. The patient agreed with the plan and demonstrated an understanding of the instructions.  A copy of instructions were sent to the patient via MyChart unless otherwise noted below.    The patient was advised to call back or seek an in-person evaluation if the symptoms worsen or if the condition fails to improve as anticipated.  Time:  I spent 10 minutes with the patient via telehealth technology discussing the above problems/concerns.    Mar Daring, PA-C

## 2022-05-17 NOTE — Telephone Encounter (Signed)
  Chief Complaint: swollen left side of face, tooth abscess Symptoms: swelling, pain, nausea Frequency: yesterday Pertinent Negatives: Patient denies fever Disposition: '[]'$ ED /'[]'$ Urgent Care (no appt availability in office) / '[]'$ Appointment(In office/virtual)/ '[x]'$  [facility]/ '[]'$ Home Care/ '[]'$ Refused Recommended Disposition /'[]'$ [facility]/ '[]'$  Follow-up with PCP Additional Notes: no appts available. Called office and spoke to [place]. Advised to send triage to office. Advised [facility] that pt has a 0915 appt.  Reason for Disposition  [1] Face is swollen AND [2] no fever  Answer Assessment - Initial Assessment Questions 1. APPEARANCE of BOIL: "What does the boil look like?"      Swollen red inside 2. LOCATION: "Where is the boil located?"      Left side  bot[place] to under jaw 3. NUMBER: "How many boils are there?"      1 large 4. SIZE: "How big is the boil?" (e.g., inches, cm; compare to size of a coin or other object)     [place] of to under jaw 5. ONSET: "When did the boil start?"     yesterday 6. PAIN: "Is there any pain?" If Yes, ask: "How bad is the pain?"   (Scale 1-10; or mild, moderate, severe)     severe 7. FEVER: "Do you have a fever?" If Yes, ask: "What is it, how was it measured, and when did it start?"      no 8. SOURCE: "Have you been around anyone with boils or other Staph infections?" "Have you ever had boils before?"     no 9. OTHER SYMPTOMS: "Do you have any other symptoms?" (e.g., shaking chills, weakness, rash elsewhere on body)     Nausea 10. PREGNANCY: "Is there any chance you are pregnant?" "When was your last menstrual period?"       *No Answer*  Answer Assessment - Initial Assessment Questions 1. LOCATION: "Which tooth is hurting?"  (e.g., right-side/left-side, upper/lower, front/back)     Upper left side 2. ONSET: "When did the toothache start?"  (e.g., hours, days)      yesterday 3. SEVERITY: "How bad is the toothache?"  (Scale 1-10; mild,  moderate or severe)   - MILD (1-3): doesn't interfere with chewing    - MODERATE (4-7): interferes with chewing, interferes with normal activities, awakens from sleep     - SEVERE (8-10): unable to eat, unable to do any normal activities, excruciating pain        severe 4. SWELLING: "Is there any visible swelling of your face?"     yes 5. OTHER SYMPTOMS: "Do you have any other symptoms?" (e.g., fever)     nausea 6. PREGNANCY: "Is there any chance you are pregnant?" "When was your last menstrual period?"     N/a  Protocols used: Boil (Skin Abscess)-A-AH, Toothache-A-AH

## 2022-05-17 NOTE — Patient Instructions (Signed)
Karen Lambert, thank you for joining Mar Daring, PA-C for today's virtual visit.  While this provider is not your primary care provider (PCP), if your PCP is located in our provider database this encounter information will be shared with them immediately following your visit.  Consent: (Patient) Karen Lambert provided verbal consent for this virtual visit at the beginning of the encounter.  Current Medications:  Current Outpatient Medications:    amoxicillin (AMOXIL) 500 MG capsule, Take 1 capsule (500 mg total) by mouth 3 (three) times daily for 10 days., Disp: 30 capsule, Rfl: 0   ibuprofen (ADVIL) 600 MG tablet, Take 1 tablet (600 mg total) by mouth every 8 (eight) hours as needed., Disp: 30 tablet, Rfl: 0   ondansetron (ZOFRAN) 4 MG tablet, Take 1 tablet (4 mg total) by mouth every 8 (eight) hours as needed for nausea or vomiting., Disp: 20 tablet, Rfl: 0   busPIRone (BUSPAR) 5 MG tablet, Take 1 tablet (5 mg total) by mouth 2 (two) times daily., Disp: 180 tablet, Rfl: 4   citalopram (CELEXA) 40 MG tablet, Take 1 tablet (40 mg total) by mouth daily., Disp: 90 tablet, Rfl: 4   fluticasone (FLONASE) 50 MCG/ACT nasal spray, Place 2 sprays into both nostrils daily., Disp: 16 g, Rfl: 6   loratadine (CLARITIN) 10 MG tablet, Take 10 mg by mouth daily., Disp: , Rfl:    MELATONIN PO, Take by mouth daily. 3 mg at bed time, Disp: , Rfl:    montelukast (SINGULAIR) 10 MG tablet, Take 1 tablet (10 mg total) by mouth at bedtime., Disp: 90 tablet, Rfl: 4   omega-3 acid ethyl esters (LOVAZA) 1 g capsule, Take 2 capsules (2 g total) by mouth 2 (two) times daily., Disp: 360 capsule, Rfl: 4   oxybutynin (DITROPAN-XL) 10 MG 24 hr tablet, Take 1 tablet (10 mg total) by mouth at bedtime., Disp: 90 tablet, Rfl: 4   rosuvastatin (CRESTOR) 20 MG tablet, Take 1 tablet (20 mg total) by mouth daily., Disp: 90 tablet, Rfl: 3   Semaglutide,0.25 or 0.'5MG'$ /DOS, (OZEMPIC, 0.25 OR 0.5 MG/DOSE,) 2 MG/1.5ML SOPN, Start  with 0.25 MG into the skin once a week x 4 weeks, then increase to 0.5 MG into the skin weekly., Disp: 1.5 mL, Rfl: 3   traZODone (DESYREL) 50 MG tablet, TAKE 1/2 TO 1 TABLET(25 TO 50 MG) BY MOUTH AT BEDTIME AS NEEDED FOR SLEEP, Disp: 30 tablet, Rfl: 12   VITAMIN D PO, Take by mouth daily. 1000 mg, Disp: , Rfl:    XIIDRA 5 % SOLN, Place 1 drop into both eyes 2 (two) times daily., Disp: , Rfl:    Medications ordered in this encounter:  Meds ordered this encounter  Medications   amoxicillin (AMOXIL) 500 MG capsule    Sig: Take 1 capsule (500 mg total) by mouth 3 (three) times daily for 10 days.    Dispense:  30 capsule    Refill:  0    Order Specific Question:   Supervising Provider    Answer:   MILLER, BRIAN [[id number]]   ibuprofen (ADVIL) 600 MG tablet    Sig: Take 1 tablet (600 mg total) by mouth every 8 (eight) hours as needed.    Dispense:  30 tablet    Refill:  0    Order Specific Question:   Supervising Provider    Answer:   MILLER, BRIAN [[id number]]   ondansetron (ZOFRAN) 4 MG tablet    Sig: Take 1 tablet (4 mg total)  by mouth every 8 (eight) hours as needed for nausea or vomiting.    Dispense:  20 tablet    Refill:  0    Order Specific Question:   Supervising Provider    Answer:   Sabra Heck, BRIAN [[id number]]     *If you need refills on other medications prior to your next appointment, please contact your pharmacy*  Follow-Up: Call back or seek an in-person evaluation if the symptoms worsen or if the condition fails to improve as anticipated.  Other Instructions  [facility] [place], [place], [place] [address].For appointments and more information, call [phone]. Dental clinic for non-insured or under-insured associated with [facility]. There is also a dental clinic associated with [place] [facility] (this does require a referral that would need to be placed by your Primary care office).   Dental Abscess  A dental  abscess is an infection around a tooth that may involve pain, swelling, and a collection of pus, as well as other symptoms. Treatment is important to help with symptoms and to prevent the infection from spreading. The general types of dental abscesses are: Pulpal abscess. This abscess may form from the inner part of the tooth (pulp). Periodontal abscess. This abscess may form from the gum. What are the causes? This condition is caused by a bacterial infection in or around the tooth. It may result from: Severe tooth decay (cavities). Trauma to the tooth, such as a broken or chipped tooth. What increases the risk? This condition is more likely to develop in males. It is also more likely to develop in people who: Have cavities. Have severe gum disease. Eat sugary snacks between meals. Use tobacco products. Have diabetes. Have a weakened disease-fighting system (immune system). Do not brush and care for their teeth regularly. What are the signs or symptoms? Mild symptoms of this condition include: Tenderness. Bad breath. Fever. A bitter taste in the mouth. Pain in and around the infected tooth. Moderate symptoms of this condition include: Swollen neck glands. Chills. Pus drainage. Swelling and redness around the infected tooth, in the mouth, or in the face. Severe pain in and around the infected tooth. Severe symptoms of this condition include: Difficulty swallowing. Difficulty opening the mouth. Nausea. Vomiting. How is this diagnosed? This condition is diagnosed based on: Your symptoms and your medical and dental history. An examination of the infected tooth. During the exam, your dental care provider may tap on the infected tooth. You may also need to have X-rays taken of the affected area. How is this treated? This condition is treated by getting rid of the infection. This may be done with: Antibiotic medicines. These may be used in certain situations. Antibacterial mouth  rinse. Incision and drainage. This procedure is done by making an incision in the abscess to drain out the pus. Removing pus is the first priority in treating an abscess. A root canal. This may be performed to save the tooth. Your dental care provider accesses the visible part of your tooth (crown) with a drill and removes any infected pulp. Then the space is filled and sealed off. Tooth extraction. The tooth is pulled out if it cannot be saved by other treatment. You may also receive treatment for pain, such as: Acetaminophen or NSAIDs. Gels that contain a numbing medicine. An injection to block the pain near your nerve. Follow these instructions at home: Medicines Take over-the-counter and prescription medicines only as told by your dental care  provider. If you were prescribed an antibiotic, take it as told by your dental care provider. Do not stop taking the antibiotic even if you start to feel better. If you were prescribed a gel that contains a numbing medicine, use it exactly as told in the directions. Do not use these gels for children who are younger than 62 years of age. Use an antibacterial mouth rinse as told by your dental care provider. General instructions  Gargle with a mixture of salt and water 3-4 times a day or as needed. To make salt water, completely dissolve -1 tsp (3-6 g) of salt in 1 cup (237 mL) of warm water. Eat a soft diet while your abscess is healing. Drink enough fluid to keep your urine pale yellow. Do not apply heat to the outside of your mouth. Do not use any products that contain nicotine or tobacco. These products include cigarettes, chewing tobacco, and vaping devices, such as e-cigarettes. If you need help quitting, ask your dental care provider. Keep all follow-up visits. This is important. How is this prevented?  Excellent dental home care, which includes brushing your teeth every morning and night with fluoride toothpaste. Floss one time each day. Get  regularly scheduled dental cleanings. Consider having a dental sealant applied on teeth that have deep grooves to prevent cavities. Drink fluoridated water regularly. This includes most tap water. Check the label on bottled water to see if it contains fluoride. Reduce or eliminate sugary drinks. Eat healthy meals and snacks. Wear a mouth guard or face shield to protect your teeth while playing sports. Contact a health care provider if: Your pain is worse and is not helped by medicine. You have swelling. You see pus around the tooth. You have a fever or chills. Get help right away if: Your symptoms suddenly get worse. You have a very bad headache. You have problems breathing or swallowing. You have trouble opening your mouth. You have swelling in your neck or around your eye. These symptoms may represent a serious problem that is an emergency. Do not wait to see if the symptoms will go away. Get medical help right away. Call your local emergency services (911 in the [place] Do not drive yourself to the hospital. Summary A dental abscess is a collection of pus in or around a tooth that results from an infection. A dental abscess may result from severe tooth decay, trauma to the tooth, or severe gum disease around a tooth. Symptoms include severe pain, swelling, redness, and drainage of pus in and around the infected tooth. The first priority in treating a dental abscess is to drain out the pus. Treatment may also involve removing damage inside the tooth (root canal) or extracting the tooth. This information is not intended to replace advice given to you by your health care provider. Make sure you discuss any questions you have with your health care provider. Document Revised: 11/04/2020 Document Reviewed: 11/04/2020 Elsevier Patient Education  [place].    If you have been instructed to have an in-person evaluation today at a local Urgent Care facility, please use the link below.  It will take you to a list of all of our available [facility], including address, phone number and hours of operation. Please do not delay care.   Urgent Cares  If you or a family member do not have a primary care provider, use the link below to schedule a visit and establish care. When you choose a [facility] primary care physician or advanced practice provider, you gain a long-term partner in health. Find a Primary Care Provider  Learn more about [facility]'s in-office and virtual care options: [employer] [facility]

## 2022-05-23 ENCOUNTER — Encounter: Payer: Self-pay | Admitting: Nurse Practitioner

## 2022-05-23 ENCOUNTER — Telehealth: Payer: Self-pay

## 2022-05-23 MED ORDER — METFORMIN HCL 500 MG PO TABS: 500.0000 mg | ORAL_TABLET | Freq: Two times a day (BID) | ORAL | 3 refills | Status: DC

## 2022-05-23 NOTE — Telephone Encounter (Signed)
Spoke with patient via MyChart to make her aware that her insurance is not covering her prescription for Ozempic. Patient says she would like start Metformin if she needs to, she just does not like the current medication. Please advise?

## 2022-05-23 NOTE — Addendum Note (Signed)
Addended by: Marnee Guarneri T on: 05/23/2022 02:53 PM   Modules accepted: Orders

## 2022-05-23 NOTE — Telephone Encounter (Signed)
Prior authorization was initiated via CoverMyMeds for prescription of Ozempic 0.25 or 0.5 MG. Patient insurance denied the PA due patient not meeting the criteria. Please advise?

## 2022-05-25 ENCOUNTER — Encounter: Payer: Self-pay | Admitting: Nurse Practitioner

## 2022-05-26 MED ORDER — BUSPIRONE HCL 5 MG PO TABS: 5.0000 mg | ORAL_TABLET | Freq: Two times a day (BID) | ORAL | 4 refills | Status: DC

## 2022-06-25 NOTE — Patient Instructions (Signed)

## 2022-06-27 ENCOUNTER — Ambulatory Visit (INDEPENDENT_AMBULATORY_CARE_PROVIDER_SITE_OTHER): Payer: Managed Care, Other (non HMO) | Admitting: Nurse Practitioner

## 2022-06-27 ENCOUNTER — Encounter: Payer: Self-pay | Admitting: Nurse Practitioner

## 2022-06-27 DIAGNOSIS — E1165 Type 2 diabetes mellitus with hyperglycemia: Secondary | ICD-10-CM

## 2022-06-27 MED ORDER — ONETOUCH VERIO W/DEVICE KIT: PACK | 0 refills | Status: AC

## 2022-06-27 MED ORDER — ONETOUCH ULTRASOFT LANCETS MISC: 12 refills | Status: AC

## 2022-06-27 MED ORDER — GLUCOSE BLOOD VI STRP: ORAL_STRIP | 12 refills | Status: DC

## 2022-06-27 NOTE — Assessment & Plan Note (Addendum)
Diagnosed August 2023 with A1c 6.5%.  Is tolerating Metformin, will continue this as is offering benefit to weight loss.  Check A1c in December.  Glucometer supplies sent in recommend she check sugars a few days a week and discussed goals with her.  Continue focus on diabetic diet.  Return to office in December for A1c check.

## 2022-06-27 NOTE — Progress Notes (Signed)
 BP 110/67   Pulse 62   Temp 98.4 F (36.9 C) (Oral)   Ht 5' 3" (1.6 m)   Wt 155 lb 8 oz (70.5 kg)   SpO2 97%   BMI 27.55 kg/m    Subjective:    Patient ID: Karen Lambert, female    DOB: 05/19/1961, 61 y.o.   MRN: [id number]  HPI: Karen Lambert is a 61 y.o. female  Chief Complaint  Patient presents with   Weight Check    Patient is here for Weight Check.    Flu Vaccine    Patient was asked about Flu vaccine and says she will have her Flu at pharmacy as she has to take her mother to have one as well.    DIABETES Recent A1c 6.5% (new elevation 5.8%) -- she was started on 500 MG BID, tolerating this well.  If she does not eat it will make her feel bad. Hypoglycemic episodes:no Polydipsia/polyuria: no Visual disturbance: no Chest pain: no Paresthesias: no Glucose Monitoring: no  Accucheck frequency: Not Checking  Fasting glucose:  Post prandial:  Evening:  Before meals: Taking Insulin?: no  Long acting insulin:  Short acting insulin: Blood Pressure Monitoring: rarely Retinal Examination: Not up to Date Foot Exam: Up to Date Pneumovax: Not up to Date Influenza: Up to Date Aspirin: no   Relevant past medical, surgical, family and social history reviewed and updated as indicated. Interim medical history since our last visit reviewed. Allergies and medications reviewed and updated.  Review of Systems  Constitutional:  Negative for activity change, appetite change, diaphoresis, fatigue and fever.  Respiratory:  Negative for cough, chest tightness and shortness of breath.   Cardiovascular:  Negative for chest pain, palpitations and leg swelling.  Gastrointestinal: Negative.   Endocrine: Negative for polydipsia, polyphagia and polyuria.  Neurological: Negative.   Psychiatric/Behavioral: Negative.      Per HPI unless specifically indicated above     Objective:    BP 110/67   Pulse 62   Temp 98.4 F (36.9 C) (Oral)   Ht 5' 3" (1.6 m)   Wt 155 lb 8 oz (70.5  kg)   SpO2 97%   BMI 27.55 kg/m   Wt Readings from Last 3 Encounters:  06/27/22 155 lb 8 oz (70.5 kg)  05/02/22 158 lb 3.2 oz (71.8 kg)  11/01/21 156 lb 3.2 oz (70.9 kg)    Physical Exam Vitals and nursing note reviewed.  Constitutional:      General: She is awake. She is not in acute distress.    Appearance: She is well-developed. She is not ill-appearing.  HENT:     Head: Normocephalic.     Right Ear: Hearing normal.     Left Ear: Hearing normal.  Eyes:     General: Lids are normal.        Right eye: No discharge.        Left eye: No discharge.     Conjunctiva/sclera: Conjunctivae normal.     Pupils: Pupils are equal, round, and reactive to light.  Neck:     Vascular: No carotid bruit.  Cardiovascular:     Rate and Rhythm: Normal rate and regular rhythm.     Heart sounds: Normal heart sounds. No murmur heard.    No gallop.  Pulmonary:     Effort: Pulmonary effort is normal. No accessory muscle usage or respiratory distress.     Breath sounds: Normal breath sounds.  Abdominal:     General: Bowel   sounds are normal.     Palpations: Abdomen is soft.  Musculoskeletal:     Cervical back: Normal range of motion and neck supple.     Right lower leg: No edema.     Left lower leg: No edema.  Skin:    General: Skin is warm and dry.  Neurological:     Mental Status: She is alert and oriented to person, place, and time.  Psychiatric:        Attention and Perception: Attention normal.        Mood and Affect: Mood normal.        Behavior: Behavior normal. Behavior is cooperative.        Thought Content: Thought content normal.        Judgment: Judgment normal.    Diabetic Foot Exam - Simple   Simple Foot Form Visual Inspection No deformities, no ulcerations, no other skin breakdown bilaterally: Yes Sensation Testing Intact to touch and monofilament testing bilaterally: Yes Pulse Check Posterior Tibialis and Dorsalis pulse intact bilaterally: Yes Comments       Results for orders placed or performed in visit on 05/02/22  Comprehensive metabolic panel  Result Value Ref Range   Glucose 131 (H) 70 - 99 mg/dL   BUN 9 8 - 27 mg/dL   Creatinine, Ser 0.83 0.57 - 1.00 mg/dL   eGFR 81 >59 mL/min/1.73   BUN/Creatinine Ratio 11 (L) 12 - 28   Sodium 138 134 - 144 mmol/L   Potassium 4.4 3.5 - 5.2 mmol/L   Chloride 99 96 - 106 mmol/L   CO2 21 20 - 29 mmol/L   Calcium 10.0 8.7 - 10.3 mg/dL   Total Protein 7.3 6.0 - 8.5 g/dL   Albumin 5.0 (H) 3.8 - 4.9 g/dL   Globulin, Total 2.3 1.5 - 4.5 g/dL   Albumin/Globulin Ratio 2.2 1.2 - 2.2   Bilirubin Total 0.3 0.0 - 1.2 mg/dL   Alkaline Phosphatase 121 44 - 121 IU/L   AST 21 0 - 40 IU/L   ALT 26 0 - 32 IU/L  Lipid Panel w/o Chol/HDL Ratio  Result Value Ref Range   Cholesterol, Total 195 100 - 199 mg/dL   Triglycerides 293 (H) 0 - 149 mg/dL   HDL 52 >39 mg/dL   VLDL Cholesterol Cal 49 (H) 5 - 40 mg/dL   LDL Chol Calc (NIH) 94 0 - 99 mg/dL  HgB A1c  Result Value Ref Range   Hgb A1c MFr Bld 6.5 (H) 4.8 - 5.6 %   Est. average glucose Bld gHb Est-mCnc 140 mg/dL      Assessment & Plan:   Problem List Items Addressed This Visit       Endocrine   Type 2 diabetes mellitus with hyperglycemia [facility])    Diagnosed August 2023 with A1c 6.5%.  Is tolerating Metformin, will continue this as is offering benefit to weight loss.  Check A1c in December.  Glucometer supplies sent in recommend she check sugars a few days a week and discussed goals with her.  Continue focus on diabetic diet.  Return to office in December for A1c check.        Follow up plan: Return in about 6 weeks (around 08/11/2022) for T2DM.

## 2022-08-06 NOTE — Patient Instructions (Addendum)
[facility] --  [phone]  Influenza (Flu) Vaccine (Inactivated or Recombinant): What You Need to Know 1. Why get vaccinated? Influenza vaccine can prevent influenza (flu). Flu is a contagious disease that spreads around the [place] every year, usually between October and May. Anyone can get the flu, but it is more dangerous for some people. Infants and young children, people 31 years and older, pregnant people, and people with certain health conditions or a weakened immune system are at greatest risk of flu complications. Pneumonia, bronchitis, sinus infections, and ear infections are examples of flu-related complications. If you have a medical condition, such as heart disease, cancer, or diabetes, flu can make it worse. Flu can cause fever and chills, sore throat, muscle aches, fatigue, cough, headache, and runny or stuffy nose. Some people may have vomiting and diarrhea, though this is more common in children than adults. In an average year, thousands of people in the [place] die from flu, and many more are hospitalized. Flu vaccine prevents millions of illnesses and flu-related visits to the doctor each year. 2. Influenza vaccines CDC recommends everyone 6 months and older get vaccinated every flu season. Children 6 months through 65 years of age may need 2 doses during a single flu season. Everyone else needs only 1 dose each flu season. It takes about 2 weeks for protection to develop after vaccination. There are many flu viruses, and they are always changing. Each year a new flu vaccine is made to protect against the influenza viruses believed to be likely to cause disease in the upcoming flu season. Even when the vaccine doesn't exactly match these viruses, it may still provide some protection. Influenza vaccine does not cause flu. Influenza vaccine may be given at the same time as other vaccines. 3. Talk with your health care provider Tell your vaccination provider if  the person getting the vaccine: Has had an allergic reaction after a previous dose of influenza vaccine, or has any severe, life-threatening allergies Has ever had Guillain-Barr Syndrome (also called "GBS") In some cases, your health care provider may decide to postpone influenza vaccination until a future visit. Influenza vaccine can be administered at any time during pregnancy. People who are or will be pregnant during influenza season should receive inactivated influenza vaccine. People with minor illnesses, such as a cold, may be vaccinated. People who are moderately or severely ill should usually wait until they recover before getting influenza vaccine. Your health care provider can give you more information. 4. Risks of a vaccine reaction Soreness, redness, and swelling where the shot is given, fever, muscle aches, and headache can happen after influenza vaccination. There may be a very small increased risk of Guillain-Barr Syndrome (GBS) after inactivated influenza vaccine (the flu shot). Young children who get the flu shot along with pneumococcal vaccine (PCV13) and/or DTaP vaccine at the same time might be slightly more likely to have a seizure caused by fever. Tell your health care provider if a child who is getting flu vaccine has ever had a seizure. People sometimes faint after medical procedures, including vaccination. Tell your provider if you feel dizzy or have vision changes or ringing in the ears. As with any medicine, there is a very remote chance of a vaccine causing a severe allergic reaction, other serious injury, or death. 5. What if there is a serious problem? An allergic reaction could occur after the vaccinated person leaves the clinic. If you see signs of a severe allergic reaction (hives, swelling  of the face and throat, difficulty breathing, a fast heartbeat, dizziness, or weakness), call 9-1-1 and get the person to the nearest hospital. For other signs that concern you,  call your health care provider. Adverse reactions should be reported to the Vaccine Adverse Event Reporting System (VAERS). Your health care provider will usually file this report, or you can do it yourself. Visit the VAERS website at www.vaers.SamedayNews.es or call 781 696 9368. VAERS is only for reporting reactions, and VAERS staff members do not give medical advice. 6. The National Vaccine Injury Compensation Program The [employer] Vaccine Injury Compensation Program (VICP) is a federal program that was created to compensate people who may have been injured by certain vaccines. Claims regarding alleged injury or death due to vaccination have a time limit for filing, which may be as short as two years. Visit the VICP website at GoldCloset.com.ee or call 907-474-2398 to learn about the program and about filing a claim. 7. How can I learn more? Ask your health care provider. Call your local or state health department. Visit the website of the Food and Drug Administration (FDA) for vaccine package inserts and additional information at TraderRating.uy. Contact the Centers for Disease Control and Prevention (CDC): Call 410-238-9616 (1-800-CDC-INFO) or Visit CDC's website at https://gibson.com/. Source: CDC Vaccine Information Statement Inactivated Influenza Vaccine (04/16/2020) This same material is available at http://www.wolf.info/ for no charge. This information is not intended to replace advice given to you by your health care provider. Make sure you discuss any questions you have with your health care provider. Document Revised: 07/26/2021 Document Reviewed: 05/19/2021 Elsevier Patient Education  [place].

## 2022-08-08 ENCOUNTER — Ambulatory Visit: Payer: Managed Care, Other (non HMO) | Admitting: Nurse Practitioner

## 2022-08-08 ENCOUNTER — Encounter: Payer: Self-pay | Admitting: Nurse Practitioner

## 2022-08-08 VITALS — BP 124/69 | HR 59 | Temp 97.9°F | Wt 153.3 lb

## 2022-08-08 DIAGNOSIS — E1169 Type 2 diabetes mellitus with other specified complication: Secondary | ICD-10-CM

## 2022-08-08 DIAGNOSIS — E785 Hyperlipidemia, unspecified: Secondary | ICD-10-CM

## 2022-08-08 DIAGNOSIS — E1165 Type 2 diabetes mellitus with hyperglycemia: Secondary | ICD-10-CM

## 2022-08-08 DIAGNOSIS — Z23 Encounter for immunization: Secondary | ICD-10-CM | POA: Diagnosis not present

## 2022-08-08 DIAGNOSIS — F411 Generalized anxiety disorder: Secondary | ICD-10-CM

## 2022-08-08 LAB — BAYER DCA HB A1C WAIVED: HB A1C (BAYER DCA - WAIVED): 5.9 % — ABNORMAL HIGH (ref 4.8–5.6)

## 2022-08-08 NOTE — Assessment & Plan Note (Signed)
Chronic, ongoing.  Repeat lipid panel today and focus on diet.  Fasting labs.  Continue Lovaza and Crestor at this time. 

## 2022-08-08 NOTE — Progress Notes (Signed)
BP 124/69   Pulse (!) 59   Temp 97.9 F (36.6 C) (Oral)   Wt 153 lb 4.8 oz (69.5 kg)   SpO2 98%   BMI 27.16 kg/m    Subjective:    Patient ID: Karen Lambert, female    DOB: 1960-11-18, 61 y.o.   MRN: [id number]  HPI: Karen Lambert is a 61 y.o. female  Chief Complaint  Patient presents with   Diabetes   DIABETES Diagnosed on 05/02/22 with A1c 6.5%. Ozempic denied at time and started Metformin 500 MG BID.  Has lost 5 lbs with Metformin, noticing reduced appetite. Hypoglycemic episodes:no Polydipsia/polyuria: no Visual disturbance: no Chest pain: no Paresthesias: no Glucose Monitoring: yes  Accucheck frequency:  varying on times  Fasting glucose: 92 to 132 -- occasional higher in evening  Post prandial:  Evening:  Before meals: Taking Insulin?: no  Long acting insulin:  Short acting insulin: Blood Pressure Monitoring: not checking Retinal Examination: Not up to Date Foot Exam: Up to Date Pneumovax: Not up to Date Influenza: Up to Date Aspirin: no   HYPERLIPIDEMIA Continues on Rosuvastatin daily. Hyperlipidemia status: good compliance Satisfied with current treatment?  yes Side effects:  no Medication compliance: good compliance Supplements: fish oil Aspirin:  no The 10-year ASCVD risk score (Arnett DK, et al., 2019) is: 6%   Values used to calculate the score:     Age: 89 years     Sex: Female     Is Non-Hispanic African American: No     Diabetic: Yes     Tobacco smoker: No     Systolic Blood Pressure: 417 mmHg     Is BP treated: No     HDL Cholesterol: 52 mg/dL     Total Cholesterol: 195 mg/dL Chest pain:  no Coronary artery disease:  no Family history CAD:  yes Family history early CAD:  no   ANXIETY/DEPRESSION Continues on Celexa 40 MG + Buspar 5 MG BID -- has not been taking Celexa, forgot.  Currently caring for her mom with dementia and she recently eloped from the house on a cold day, but since then they have changed routines and she is back in  home and stable -- however major stressor for patient. Mood status: exacerbated Satisfied with current treatment?: yes Symptom severity: moderate  Duration of current treatment : chronic Side effects: no Medication compliance: good compliance Psychotherapy/counseling: none Depressed mood: yes Anxious mood: yes Anhedonia: no Significant weight loss or gain: no Insomnia: yes hard to fall asleep Fatigue: yes Feelings of worthlessness or guilt: no Impaired concentration/indecisiveness: no Suicidal ideations: no Hopelessness: no Crying spells: yes    08/08/2022    9:11 AM 06/27/2022    9:00 AM 05/02/2022    8:26 AM 11/01/2021    4:39 PM 03/03/2021    9:03 AM  Depression screen PHQ 2/9  Decreased Interest 3 2 0 2 1  Down, Depressed, Hopeless '2 2 1 3 1  '$ PHQ - 2 Score '5 4 1 5 2  '$ Altered sleeping '3 2 3 3 2  '$ Tired, decreased energy '3 3 2 3 1  '$ Change in appetite '3 2 2 1 '$ 0  Feeling bad or failure about yourself  '3 2 3 2 '$ 0  Trouble concentrating '3 2 3 3 '$ 0  Moving slowly or fidgety/restless 2 0 0 0 0  Suicidal thoughts 1 0 0 0 0  PHQ-9 Score '23 15 14 17 5  '$ Difficult doing work/chores  Somewhat difficult Somewhat difficult Not  difficult at all Not difficult at all       08/08/2022    9:11 AM 06/27/2022    9:01 AM 05/02/2022    8:27 AM 11/01/2021    4:42 PM  GAD 7 : Generalized Anxiety Score  Nervous, Anxious, on Edge '3 3 3 '$ 0  Control/stop worrying '3 3 3 '$ 0  Worry too much - different things '3 3 3 '$ 0  Trouble relaxing '3 3 3 '$ 0  Restless '2 3 2 '$ 0  Easily annoyed or irritable '3 3 3 '$ 0  Afraid - awful might happen '2 3 3 '$ 0  Total GAD 7 Score '19 21 20 '$ 0  Anxiety Difficulty Somewhat difficult Somewhat difficult Very difficult Not difficult at all   Relevant past medical, surgical, family and social history reviewed and updated as indicated. Interim medical history since our last visit reviewed. Allergies and medications reviewed and updated.  Review of Systems  Constitutional:  Negative  for activity change, appetite change, diaphoresis, fatigue and fever.  Respiratory:  Negative for cough, chest tightness and shortness of breath.   Cardiovascular:  Negative for chest pain, palpitations and leg swelling.  Gastrointestinal: Negative.   Endocrine: Negative for polydipsia, polyphagia and polyuria.  Neurological: Negative.   Psychiatric/Behavioral:  Positive for sleep disturbance. Negative for decreased concentration, self-injury and suicidal ideas. The patient is nervous/anxious.    Per HPI unless specifically indicated above     Objective:    BP 124/69   Pulse (!) 59   Temp 97.9 F (36.6 C) (Oral)   Wt 153 lb 4.8 oz (69.5 kg)   SpO2 98%   BMI 27.16 kg/m   Wt Readings from Last 3 Encounters:  08/08/22 153 lb 4.8 oz (69.5 kg)  06/27/22 155 lb 8 oz (70.5 kg)  05/02/22 158 lb 3.2 oz (71.8 kg)    Physical Exam Vitals and nursing note reviewed.  Constitutional:      General: She is awake. She is not in acute distress.    Appearance: She is well-developed. She is not ill-appearing.  HENT:     Head: Normocephalic.     Right Ear: Hearing normal.     Left Ear: Hearing normal.  Eyes:     General: Lids are normal.        Right eye: No discharge.        Left eye: No discharge.     Conjunctiva/sclera: Conjunctivae normal.     Pupils: Pupils are equal, round, and reactive to light.  Neck:     Vascular: No carotid bruit.  Cardiovascular:     Rate and Rhythm: Normal rate and regular rhythm.     Heart sounds: Normal heart sounds. No murmur heard.    No gallop.  Pulmonary:     Effort: Pulmonary effort is normal. No accessory muscle usage or respiratory distress.     Breath sounds: Normal breath sounds.  Abdominal:     General: Bowel sounds are normal.     Palpations: Abdomen is soft.  Musculoskeletal:     Cervical back: Normal range of motion and neck supple.     Right lower leg: No edema.     Left lower leg: No edema.  Skin:    General: Skin is warm and dry.   Neurological:     Mental Status: She is alert and oriented to person, place, and time.  Psychiatric:        Attention and Perception: Attention normal.        Mood and  Affect: Mood normal.        Behavior: Behavior normal. Behavior is cooperative.        Thought Content: Thought content normal.        Judgment: Judgment normal.    Results for orders placed or performed in visit on 08/08/22  Bayer DCA Hb A1c Waived  Result Value Ref Range   HB A1C (BAYER DCA - WAIVED) 5.9 (H) 4.8 - 5.6 %      Assessment & Plan:   Problem List Items Addressed This Visit       Endocrine   Hyperlipidemia associated with type 2 diabetes mellitus (HCC)    Chronic, ongoing.  Repeat lipid panel today and focus on diet.  Fasting labs.  Continue Lovaza and Crestor at this time.      Relevant Orders   Lipid Panel w/o Chol/HDL Ratio   Type 2 diabetes mellitus with hyperglycemia ([place]) - Primary    Diagnosed August 2023 with A1c 6.5%, A1c today is trending down to 5.9% and she is losing weight.  Is tolerating Metformin, will continue this as is offering benefit to weight loss.  Continue to monitor blood sugar at home daily and document.  Continue focus on diabetic diet.  Return to office in 3 months.      Relevant Orders   Bayer DCA Hb A1c Waived (Completed)   Basic metabolic panel     Other   Generalized anxiety disorder    Chronic, exacerbated.  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  Denies SI/HI.  Recommend she restart Citalopram 40 MG daily, which benefited both mood and menopausal symptoms, plus Trazodone PRN 25-50 MG at HS for short period for sleep needs.  Continue Buspar 5 MG BID to help with anxiety and may increase to 10 MG BID if needed in future.  Discussed various methods of meditation and relaxation techniques at home.  Recommend she get palliative referral for her mother and looking in hospice caregiver support group.  Provided her # for [facility] to reach out to.       Other Visit Diagnoses     Flu vaccine need       Flu vaccine in office today.        Follow up plan: Return in about 3 months (around 11/06/2022) for Annual physical and A1c check.

## 2022-08-08 NOTE — Assessment & Plan Note (Signed)
Chronic, exacerbated.  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  Denies SI/HI.  Recommend she restart Citalopram 40 MG daily, which benefited both mood and menopausal symptoms, plus Trazodone PRN 25-50 MG at HS for short period for sleep needs.  Continue Buspar 5 MG BID to help with anxiety and may increase to 10 MG BID if needed in future.  Discussed various methods of meditation and relaxation techniques at home.  Recommend she get palliative referral for her mother and looking in hospice caregiver support group.  Provided her # for [facility] to reach out to.

## 2022-08-08 NOTE — Assessment & Plan Note (Signed)
Diagnosed August 2023 with A1c 6.5%, A1c today is trending down to 5.9% and she is losing weight.  Is tolerating Metformin, will continue this as is offering benefit to weight loss.  Continue to monitor blood sugar at home daily and document.  Continue focus on diabetic diet.  Return to office in 3 months.

## 2022-08-09 LAB — BASIC METABOLIC PANEL
BUN/Creatinine Ratio: 12 (ref 12–28)
BUN: 11 mg/dL (ref 8–27)
CO2: 17 mmol/L — ABNORMAL LOW (ref 20–29)
Calcium: 10 mg/dL (ref 8.7–10.3)
Chloride: 101 mmol/L (ref 96–106)
Creatinine, Ser: 0.91 mg/dL (ref 0.57–1.00)
Glucose: 102 mg/dL — ABNORMAL HIGH (ref 70–99)
Potassium: 4.3 mmol/L (ref 3.5–5.2)
Sodium: 141 mmol/L (ref 134–144)
eGFR: 72 mL/min/{1.73_m2} (ref >59)

## 2022-08-09 LAB — LIPID PANEL W/O CHOL/HDL RATIO
Cholesterol, Total: 106 mg/dL (ref 100–199)
HDL: 42 mg/dL (ref >39)
LDL Chol Calc (NIH): 23 mg/dL (ref 0–99)
Triglycerides: 276 mg/dL — ABNORMAL HIGH (ref 0–149)
VLDL Cholesterol Cal: 41 mg/dL — ABNORMAL HIGH (ref 5–40)

## 2022-08-09 NOTE — Progress Notes (Signed)
Contacted via MyChart   Good evening Karen Lambert, your labs have returned: - Kidney function, creatinine and eGFR, remains normal. - Cholesterol levels much improved this check with LDL 23!!  Wonderful.  Triglycerides remain a bit elevated -- continue current medications.  Any questions? Keep being amazing!!  Thank you for allowing me to participate in your care.  I appreciate you. Kindest regards, Audry Kauzlarich

## 2022-10-12 LAB — HM DIABETES EYE EXAM

## 2022-10-29 NOTE — Patient Instructions (Signed)
Please call to schedule your mammogram and/or bone density: [facility]: [address], [place], [place] [address] Phone: [phone]  [place] [facility] [address] [place],  [place]  [address] Phone: [phone]    Diabetes Mellitus Basics  Diabetes mellitus, or diabetes, is a long-term (chronic) disease. It occurs when the body does not properly use sugar (glucose) that is released from food after you eat. Diabetes mellitus may be caused by one or both of these problems: Your pancreas does not make enough of a hormone called insulin. Your body does not react in a normal way to the insulin that it makes. Insulin lets glucose enter cells in your body. This gives you energy. If you have diabetes, glucose cannot get into cells. This causes high blood glucose (hyperglycemia). How to treat and manage diabetes You may need to take insulin or other diabetes medicines daily to keep your glucose in balance. If you are prescribed insulin, you will learn how to give yourself insulin by injection. You may need to adjust the amount of insulin you take based on the foods that you eat. You will need to check your blood glucose levels using a glucose monitor as told by your health care provider. The readings can help determine if you have low or high blood glucose. Generally, you should have these blood glucose levels: Before meals (preprandial): 80-130 mg/dL (4.4-7.2 mmol/L). After meals (postprandial): below 180 mg/dL (10 mmol/L). Hemoglobin A1c (HbA1c) level: less than 7%. Your health care provider will set treatment goals for you. Keep all follow-up visits. This is important. Follow these instructions at home: Diabetes medicines Take your diabetes medicines every day as told by your health care provider. List your diabetes medicines here: Name of medicine: ______________________________ Amount (dose):  _______________ Time (a.m./p.m.): _______________ Notes: ___________________________________ Name of medicine: ______________________________ Amount (dose): _______________ Time (a.m./p.m.): _______________ Notes: ___________________________________ Name of medicine: ______________________________ Amount (dose): _______________ Time (a.m./p.m.): _______________ Notes: ___________________________________ Insulin If you use insulin, list the types of insulin you use here: Insulin type: ______________________________ Amount (dose): _______________ Time (a.m./p.m.): _______________Notes: ___________________________________ Insulin type: ______________________________ Amount (dose): _______________ Time (a.m./p.m.): _______________ Notes: ___________________________________ Insulin type: ______________________________ Amount (dose): _______________ Time (a.m./p.m.): _______________ Notes: ___________________________________ Insulin type: ______________________________ Amount (dose): _______________ Time (a.m./p.m.): _______________ Notes: ___________________________________ Insulin type: ______________________________ Amount (dose): _______________ Time (a.m./p.m.): _______________ Notes: ___________________________________ Managing blood glucose  Check your blood glucose levels using a glucose monitor as told by your health care provider. Write down the times that you check your glucose levels here: Time: _______________ Notes: ___________________________________ Time: _______________ Notes: ___________________________________ Time: _______________ Notes: ___________________________________ Time: _______________ Notes: ___________________________________ Time: _______________ Notes: ___________________________________ Time: _______________ Notes: ___________________________________  Low blood glucose Low blood glucose (hypoglycemia) is when glucose is at or below 70 mg/dL (3.9 mmol/L).  Symptoms may include: Feeling: Hungry. Sweaty and clammy. Irritable or easily upset. Dizzy. Sleepy. Having: A fast heartbeat. A headache. A change in your vision. Numbness around the mouth, lips, or tongue. Having trouble with: Moving (coordination). Sleeping. Treating low blood glucose To treat low blood glucose, eat or drink something containing sugar right away. If you can think clearly and swallow safely, follow the 15:15 rule: Take 15 grams of a fast-acting carb (carbohydrate), as told by your health care provider. Some fast-acting carbs are: Glucose tablets: take 3-4 tablets. Hard candy: eat 3-5 pieces. Fruit juice: drink 4 oz (120 mL). Regular (not diet) soda: drink 4-6 oz (120-180 mL).  Honey or sugar: eat 1 Tbsp (15 mL). Check your blood glucose levels 15 minutes after you take the carb. If your glucose is still at or below 70 mg/dL (3.9 mmol/L), take 15 grams of a carb again. If your glucose does not go above 70 mg/dL (3.9 mmol/L) after 3 tries, get help right away. After your glucose goes back to normal, eat a meal or a snack within 1 hour. Treating very low blood glucose If your glucose is at or below 54 mg/dL (3 mmol/L), you have very low blood glucose (severe hypoglycemia). This is an emergency. Do not wait to see if the symptoms will go away. Get medical help right away. Call your local emergency services (911 in the [place] Do not drive yourself to the hospital. Questions to ask your health care provider Should I talk with a diabetes educator? What equipment will I need to care for myself at home? What diabetes medicines do I need? When should I take them? How often do I need to check my blood glucose levels? What number can I call if I have questions? When is my follow-up visit? Where can I find a support group for people with diabetes? Where to find more information American Diabetes Association: www.diabetes.org Association of Diabetes Care and Education  Specialists: www.diabeteseducator.org Contact a health care provider if: Your blood glucose is at or above 240 mg/dL (13.3 mmol/L) for 2 days in a row. You have been sick or have had a fever for 2 days or more, and you are not getting better. You have any of these problems for more than 6 hours: You cannot eat or drink. You feel nauseous. You vomit. You have diarrhea. Get help right away if: Your blood glucose is lower than 54 mg/dL (3 mmol/L). You get confused. You have trouble thinking clearly. You have trouble breathing. These symptoms may represent a serious problem that is an emergency. Do not wait to see if the symptoms will go away. Get medical help right away. Call your local emergency services (911 in the [place] Do not drive yourself to the hospital. Summary Diabetes mellitus is a chronic disease that occurs when the body does not properly use sugar (glucose) that is released from food after you eat. Take insulin and diabetes medicines as told. Check your blood glucose every day, as often as told. Keep all follow-up visits. This is important. This information is not intended to replace advice given to you by your health care provider. Make sure you discuss any questions you have with your health care provider. Document Revised: 12/30/2019 Document Reviewed: 12/30/2019 Elsevier Patient Education  [place].

## 2022-11-02 ENCOUNTER — Ambulatory Visit (INDEPENDENT_AMBULATORY_CARE_PROVIDER_SITE_OTHER): Payer: Managed Care, Other (non HMO) | Admitting: Nurse Practitioner

## 2022-11-02 ENCOUNTER — Encounter: Payer: Self-pay | Admitting: Nurse Practitioner

## 2022-11-02 VITALS — BP 114/68 | HR 51 | Temp 97.7°F | Ht 62.99 in | Wt 150.8 lb

## 2022-11-02 DIAGNOSIS — N393 Stress incontinence (female) (male): Secondary | ICD-10-CM

## 2022-11-02 DIAGNOSIS — Z0000 Encounter for general adult medical examination without abnormal findings: Secondary | ICD-10-CM | POA: Diagnosis not present

## 2022-11-02 DIAGNOSIS — E559 Vitamin D deficiency, unspecified: Secondary | ICD-10-CM

## 2022-11-02 DIAGNOSIS — N951 Menopausal and female climacteric states: Secondary | ICD-10-CM | POA: Diagnosis not present

## 2022-11-02 DIAGNOSIS — F411 Generalized anxiety disorder: Secondary | ICD-10-CM

## 2022-11-02 DIAGNOSIS — J0110 Acute frontal sinusitis, unspecified: Secondary | ICD-10-CM

## 2022-11-02 DIAGNOSIS — R8281 Pyuria: Secondary | ICD-10-CM

## 2022-11-02 DIAGNOSIS — E1165 Type 2 diabetes mellitus with hyperglycemia: Secondary | ICD-10-CM

## 2022-11-02 DIAGNOSIS — E785 Hyperlipidemia, unspecified: Secondary | ICD-10-CM

## 2022-11-02 DIAGNOSIS — J329 Chronic sinusitis, unspecified: Secondary | ICD-10-CM | POA: Insufficient documentation

## 2022-11-02 DIAGNOSIS — Z1231 Encounter for screening mammogram for malignant neoplasm of breast: Secondary | ICD-10-CM

## 2022-11-02 DIAGNOSIS — E1169 Type 2 diabetes mellitus with other specified complication: Secondary | ICD-10-CM

## 2022-11-02 DIAGNOSIS — R399 Unspecified symptoms and signs involving the genitourinary system: Secondary | ICD-10-CM

## 2022-11-02 DIAGNOSIS — J301 Allergic rhinitis due to pollen: Secondary | ICD-10-CM

## 2022-11-02 DIAGNOSIS — K219 Gastro-esophageal reflux disease without esophagitis: Secondary | ICD-10-CM

## 2022-11-02 DIAGNOSIS — N3281 Overactive bladder: Secondary | ICD-10-CM

## 2022-11-02 LAB — WET PREP FOR TRICH, YEAST, CLUE
Clue Cell Exam: NEGATIVE
Trichomonas Exam: NEGATIVE
Yeast Exam: NEGATIVE

## 2022-11-02 LAB — URINALYSIS, ROUTINE W REFLEX MICROSCOPIC
Bilirubin, UA: NEGATIVE
Glucose, UA: NEGATIVE
Ketones, UA: NEGATIVE
Nitrite, UA: NEGATIVE
Specific Gravity, UA: 1.015 (ref 1.005–1.030)
Urobilinogen, Ur: 0.2 mg/dL (ref 0.2–1.0)
pH, UA: 6 (ref 5.0–7.5)

## 2022-11-02 LAB — MICROSCOPIC EXAMINATION: Bacteria, UA: NONE SEEN

## 2022-11-02 LAB — MICROALBUMIN, URINE WAIVED
Creatinine, Urine Waived: 100 mg/dL (ref 10–300)
Microalb, Ur Waived: 150 mg/L — ABNORMAL HIGH (ref 0–19)
Microalb/Creat Ratio: >300 mg/g — ABNORMAL HIGH (ref <30)

## 2022-11-02 LAB — BAYER DCA HB A1C WAIVED: HB A1C (BAYER DCA - WAIVED): 5.7 % — ABNORMAL HIGH (ref 4.8–5.6)

## 2022-11-02 MED ORDER — VENLAFAXINE HCL ER 37.5 MG PO CP24: 37.5000 mg | ORAL_CAPSULE | Freq: Every day | ORAL | 2 refills | Status: DC

## 2022-11-02 MED ORDER — AMOXICILLIN-POT CLAVULANATE 875-125 MG PO TABS: 1.0000 | ORAL_TABLET | Freq: Two times a day (BID) | ORAL | 0 refills | Status: AC

## 2022-11-02 NOTE — Assessment & Plan Note (Signed)
Chronic, exacerbated.  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  Denies SI/HI.  Celexa causing side effects.  Will perform direct switch to Effexor, discussed with patient.  Start at Effexor 37.5 MG daily and stop Celexa -- may continue offer benefit to menopausal symptoms and mood.  Continue Trazodone PRN 25-50 MG at HS for short period for sleep needs.  Continue Buspar 5 MG BID to help with anxiety and may increase to 10 MG BID if needed in future.  Discussed various methods of meditation and relaxation techniques at home.  Recommend she get palliative referral for her mother and looking in hospice caregiver support group.  Return in 4 weeks.

## 2022-11-02 NOTE — Assessment & Plan Note (Signed)
Refer to [facility] plan of care.

## 2022-11-02 NOTE — Assessment & Plan Note (Signed)
Chronic, ongoing.  Continue supplement and recheck Vitamin D level today.  May consider return to Vit D weekly if on lower side.

## 2022-11-02 NOTE — Assessment & Plan Note (Signed)
Diagnosed August 2023 with A1c 6.5%, A1c last visit 5.9%, will recheck today.  Check urine ALB.  Continues to lose weight with Metformin and diet + exercise.  Is tolerating Metformin, will continue this as is offering benefit to weight loss.  Continue to monitor blood sugar at home daily and document.  Continue focus on diabetic diet.

## 2022-11-02 NOTE — Progress Notes (Signed)
BP 114/68   Pulse (!) 51   Temp 97.7 F (36.5 C) (Oral)   Ht 5' 2.99" (1.6 m)   Wt 150 lb 12.8 oz (68.4 kg)   SpO2 98%   BMI 26.72 kg/m    Subjective:    Patient ID: Karen Lambert, female    DOB: Jul 16, 1961, 62 y.o.   MRN: [id number]  HPI: ADYSIN SPITALE is a 62 y.o. female presenting on 11/02/2022 for comprehensive medical examination. Current medical complaints include: UTI symptoms  She currently lives with: self Menopausal Symptoms: no  Continues on Claritin and Singulair for allergic rhinitis.  Has been having increased sinus issues to left side for 6 days and drainage.  URINARY SYMPTOMS Started with urinary symptoms 2 days ago.  Has underlying OAB and takes Ditropan XL for this daily. Dysuria: no Urinary frequency: yes Urgency: yes Small volume voids: yes Symptom severity: yes Urinary incontinence: yes Foul odor: yes Hematuria: no Abdominal pain: no Back pain: yes Suprapubic pain/pressure: no Flank pain: no Fever:  no Vomiting: no Status: stable Previous urinary tract infection: yes Recurrent urinary tract infection: no Sexual activity: No sexually active History of sexually transmitted disease: no Treatments attempted: increasing fluids    DIABETES Continues on Metformin 500 MG BID.  Last A1c in November 5.9%.   Hypoglycemic episodes:no Polydipsia/polyuria: no Visual disturbance: no Chest pain: no Paresthesias: no Glucose Monitoring: yes  Accucheck frequency: Daily  Fasting glucose: forgot log, but has been in normal range  Post prandial:  Evening:  Before meals: Taking Insulin?: no  Long acting insulin:  Short acting insulin: Blood Pressure Monitoring: not checking Retinal Examination: Up To Date -- [facility] Foot Exam: Up to Date Pneumovax: given today Influenza: Up to Date Aspirin: no   HYPERLIPIDEMIA No current ACE or ARB due to stable BP.  Continues on Rosuvastatin nad Omega 3. Hyperlipidemia status: good compliance Satisfied with  current treatment?  yes Side effects:  no Medication compliance: good compliance Supplements: fish oil Aspirin:  no The ASCVD Risk score (Arnett DK, et al., 2019) failed to calculate for the following reasons:   The valid total cholesterol range is 130 to 320 mg/dL Chest pain:  no Coronary artery disease:  no Family history CAD:  yes Family history early CAD:  no   DEPRESSION Taking Celexa 40 MG daily, which overall helps mood and hot flashes but the anticholinergic effects are causing dry mouth and eyes.  Taking Buspar 5 MG BID.    Continues Trazodone every night for sleep.   Mood status: uncontrolled Satisfied with current treatment?: yes Symptom severity: moderate  Duration of current treatment : chronic Side effects: no Medication compliance: good compliance Psychotherapy/counseling: none Depressed mood: yes Anxious mood: yes Anhedonia: no Significant weight loss or gain: no Insomnia: yes hard to fall asleep Fatigue: yes Feelings of worthlessness or guilt: yes Impaired concentration/indecisiveness: yes Suicidal ideations: no Hopelessness: yes Crying spells: no    11/02/2022    8:15 AM 08/08/2022    9:11 AM 06/27/2022    9:00 AM 05/02/2022    8:26 AM 11/01/2021    4:39 PM  Depression screen PHQ 2/9  Decreased Interest 2 3 2 $ 0 2  Down, Depressed, Hopeless 2 2 2 1 3  $ PHQ - 2 Score 4 5 4 1 5  $ Altered sleeping 2 3 2 3 3  $ Tired, decreased energy 3 3 3 2 3  $ Change in appetite 2 3 2 2 1  $ Feeling bad or failure about yourself  $2 3 2 3 2  C$ Trouble concentrating 3 3 2 3 3  $ Moving slowly or fidgety/restless 0 2 0 0 0  Suicidal thoughts 1 1 0 0 0  PHQ-9 Score 17 23 15 14 17  $ Difficult doing work/chores Somewhat difficult  Somewhat difficult Somewhat difficult Not difficult at all       11/02/2022    8:15 AM 08/08/2022    9:11 AM 06/27/2022    9:01 AM 05/02/2022    8:27 AM  GAD 7 : Generalized Anxiety Score  Nervous, Anxious, on Edge 3 3 3 3  $ Control/stop worrying 3 3 3  3  $ Worry too much - different things 3 3 3 3  $ Trouble relaxing 3 3 3 3  $ Restless 1 2 3 2  $ Easily annoyed or irritable 3 3 3 3  $ Afraid - awful might happen 3 2 3 3  $ Total GAD 7 Score 19 19 21 20  $ Anxiety Difficulty Somewhat difficult Somewhat difficult Somewhat difficult Very difficult      11/13/2018    2:19 PM 11/27/2018    8:21 AM 11/01/2021    4:25 PM 08/08/2022    9:10 AM 11/02/2022    8:15 AM  [place] in the past year? 0 1 0 1 0  Was there an injury with Fall? 0 0 0 0 0  Fall Risk Category Calculator 0 1 0 1 0  Fall Risk Category (Retired) Low Low Low Low   (RETIRED) Patient Fall Risk Level Low fall risk Low fall risk Low fall risk    Patient at Risk for Falls Due to   No Fall Risks History of fall(s) No Fall Risks  Fall risk Follow up Falls evaluation completed Falls evaluation completed;Education provided Falls prevention discussed;Education provided Falls evaluation completed Falls evaluation completed    Functional Status Survey: Is the patient deaf or have difficulty hearing?: No Does the patient have difficulty seeing, even when wearing glasses/contacts?: No Does the patient have difficulty concentrating, remembering, or making decisions?: No Does the patient have difficulty walking or climbing stairs?: No Does the patient have difficulty dressing or bathing?: No Does the patient have difficulty doing errands alone such as visiting a doctor's office or shopping?: No    Past Medical History:  Past Medical History:  Diagnosis Date   Anxiety    Broken ankle    Hyperlipidemia    Overactive bladder    Skin cancer 2012    Surgical History:  Past Surgical History:  Procedure Laterality Date   ABDOMINAL HYSTERECTOMY  2010   BASAL CELL CARCINOMA EXCISION     L shoulder and R upper thigh   CESAREAN SECTION     COLONOSCOPY WITH PROPOFOL N/A 12/20/2015   Procedure: COLONOSCOPY WITH PROPOFOL;  Surgeon: Hulen Luster, MD;  Location: [facility] ENDOSCOPY;  Service:  Gastroenterology;  Laterality: N/A;    Medications:  Current Outpatient Medications on File Prior to Visit  Medication Sig   Blood Glucose Monitoring Suppl (ONETOUCH VERIO) w/Device KIT Use to check blood sugar 3 times a day and document results, bring to appointments.  Goal is <130 fasting blood sugar and <180 two hours after meals.   busPIRone (BUSPAR) 5 MG tablet Take 1 tablet (5 mg total) by mouth 2 (two) times daily.   glucose blood test strip Use to check blood sugar 3 times a day and document results, bring to appointments.  Goal is <130 fasting blood sugar and <180 two hours after meals.   ibuprofen (  ADVIL) 600 MG tablet Take 1 tablet (600 mg total) by mouth every 8 (eight) hours as needed.   Lancets (ONETOUCH ULTRASOFT) lancets Use to check blood sugar 3 times a day and document results, bring to appointments.  Goal is <130 fasting blood sugar and <180 two hours after meals.   loratadine (CLARITIN) 10 MG tablet Take 10 mg by mouth daily.   MELATONIN PO Take by mouth daily. 3 mg at bed time   metFORMIN (GLUCOPHAGE) 500 MG tablet Take 1 tablet (500 mg total) by mouth 2 (two) times daily with a meal.   montelukast (SINGULAIR) 10 MG tablet Take 1 tablet (10 mg total) by mouth at bedtime.   omega-3 acid ethyl esters (LOVAZA) 1 g capsule Take 2 capsules (2 g total) by mouth 2 (two) times daily.   ondansetron (ZOFRAN) 4 MG tablet Take 1 tablet (4 mg total) by mouth every 8 (eight) hours as needed for nausea or vomiting.   oxybutynin (DITROPAN-XL) 10 MG 24 hr tablet Take 1 tablet (10 mg total) by mouth at bedtime.   rosuvastatin (CRESTOR) 20 MG tablet Take 1 tablet (20 mg total) by mouth daily.   traZODone (DESYREL) 50 MG tablet TAKE 1/2 TO 1 TABLET(25 TO 50 MG) BY MOUTH AT BEDTIME AS NEEDED FOR SLEEP   VITAMIN D PO Take by mouth daily. 1000 mg   XIIDRA 5 % SOLN Place 1 drop into both eyes 2 (two) times daily.   No current facility-administered medications on file prior to visit.     Allergies:  Allergies  Allergen Reactions   Latex Rash and Shortness Of Breath    Social History:  Social History   Socioeconomic History   Marital status: Divorced    Spouse name: Not on file   Number of children: Not on file   Years of education: Not on file   Highest education level: Not on file  Occupational History   Not on file  Tobacco Use   Smoking status: Former   Smokeless tobacco: Never  Vaping Use   Vaping Use: Never used  Substance and Sexual Activity   Alcohol use: Yes    Alcohol/week: 1.0 standard drink of alcohol    Types: 1 Glasses of wine per week   Drug use: Not Currently   Sexual activity: Not Currently  Other Topics Concern   Not on file  Social History Narrative   Not on file   Social Determinants of Health   Financial Resource Strain: Not on file  Food Insecurity: Not on file  Transportation Needs: Not on file  Physical Activity: Not on file  Stress: Not on file  Social Connections: Not on file  Intimate Partner Violence: Not on file   Social History   Tobacco Use  Smoking Status Former  Smokeless Tobacco Never   Social History   Substance and Sexual Activity  Alcohol Use Yes   Alcohol/week: 1.0 standard drink of alcohol   Types: 1 Glasses of wine per week    Family History:  Family History  Problem Relation Age of Onset   Heart disease Mother    Diabetes Mother    Cataracts Mother    Dementia Mother    Cancer Father    Diabetes Father    Stroke Father    Lymphoma Brother    Diabetes Brother    Diabetes Brother    Breast cancer Paternal Aunt    Stroke Maternal Grandfather     Past medical history, surgical history, medications,  allergies, family history and social history reviewed with patient today and changes made to appropriate areas of the chart.   ROS All other ROS negative except what is listed above and in the HPI.      Objective:    BP 114/68   Pulse (!) 51   Temp 97.7 F (36.5 C) (Oral)   Ht 5'  2.99" (1.6 m)   Wt 150 lb 12.8 oz (68.4 kg)   SpO2 98%   BMI 26.72 kg/m   Wt Readings from Last 3 Encounters:  11/02/22 150 lb 12.8 oz (68.4 kg)  08/08/22 153 lb 4.8 oz (69.5 kg)  06/27/22 155 lb 8 oz (70.5 kg)    Physical Exam Vitals and nursing note reviewed. Exam conducted with a chaperone present.  Constitutional:      General: She is awake. She is not in acute distress.    Appearance: She is well-developed and well-groomed. She is not ill-appearing or toxic-appearing.  HENT:     Head: Normocephalic and atraumatic.     Right Ear: Hearing, ear canal and external ear normal. No drainage. A middle ear effusion is present.     Left Ear: Hearing, ear canal and external ear normal. No drainage. A middle ear effusion is present.     Nose:     Right Sinus: No maxillary sinus tenderness or frontal sinus tenderness.     Left Sinus: Frontal sinus tenderness present. No maxillary sinus tenderness.     Mouth/Throat:     Mouth: Mucous membranes are moist.     Pharynx: Oropharynx is clear. Uvula midline. Posterior oropharyngeal erythema present. No pharyngeal swelling or oropharyngeal exudate.  Eyes:     General: Lids are normal.        Right eye: No discharge.        Left eye: No discharge.     Extraocular Movements: Extraocular movements intact.     Conjunctiva/sclera: Conjunctivae normal.     Pupils: Pupils are equal, round, and reactive to light.     Visual Fields: Right eye visual fields normal and left eye visual fields normal.  Neck:     Thyroid: No thyromegaly.     Vascular: No carotid bruit.     Trachea: Trachea normal.  Cardiovascular:     Rate and Rhythm: Normal rate and regular rhythm.     Heart sounds: Normal heart sounds. No murmur heard.    No gallop.  Pulmonary:     Effort: Pulmonary effort is normal. No accessory muscle usage or respiratory distress.     Breath sounds: Normal breath sounds.  Chest:  Breasts:    Right: Normal.     Left: Normal.  Abdominal:      General: Bowel sounds are normal.     Palpations: Abdomen is soft. There is no hepatomegaly or splenomegaly.     Tenderness: There is no abdominal tenderness.  Musculoskeletal:        General: Normal range of motion.     Cervical back: Normal range of motion and neck supple.     Right lower leg: No edema.     Left lower leg: No edema.  Lymphadenopathy:     Head:     Right side of head: No submental, submandibular, tonsillar, preauricular or posterior auricular adenopathy.     Left side of head: No submental, submandibular, tonsillar, preauricular or posterior auricular adenopathy.     Cervical: No cervical adenopathy.     Upper Body:     Right  upper body: No supraclavicular, axillary or pectoral adenopathy.     Left upper body: No supraclavicular, axillary or pectoral adenopathy.  Skin:    General: Skin is warm and dry.     Capillary Refill: Capillary refill takes less than 2 seconds.     Findings: No rash.  Neurological:     Mental Status: She is alert and oriented to person, place, and time.     Gait: Gait is intact.     Deep Tendon Reflexes: Reflexes are normal and symmetric.     Reflex Scores:      Brachioradialis reflexes are 2+ on the right side and 2+ on the left side.      Patellar reflexes are 2+ on the right side and 2+ on the left side. Psychiatric:        Attention and Perception: Attention normal.        Mood and Affect: Mood normal.        Speech: Speech normal.        Behavior: Behavior normal. Behavior is cooperative.        Thought Content: Thought content normal.        Judgment: Judgment normal.    Results for orders placed or performed in visit on 08/08/22  Bayer DCA Hb A1c Waived  Result Value Ref Range   HB A1C (BAYER DCA - WAIVED) 5.9 (H) 4.8 - 5.6 %  Basic metabolic panel  Result Value Ref Range   Glucose 102 (H) 70 - 99 mg/dL   BUN 11 8 - 27 mg/dL   Creatinine, Ser 0.91 0.57 - 1.00 mg/dL   eGFR 72 >59 mL/min/1.73   BUN/Creatinine Ratio 12 12 - 28    Sodium 141 134 - 144 mmol/L   Potassium 4.3 3.5 - 5.2 mmol/L   Chloride 101 96 - 106 mmol/L   CO2 17 (L) 20 - 29 mmol/L   Calcium 10.0 8.7 - 10.3 mg/dL  Lipid Panel w/o Chol/HDL Ratio  Result Value Ref Range   Cholesterol, Total 106 100 - 199 mg/dL   Triglycerides 276 (H) 0 - 149 mg/dL   HDL 42 >39 mg/dL   VLDL Cholesterol Cal 41 (H) 5 - 40 mg/dL   LDL Chol Calc (NIH) 23 0 - 99 mg/dL      Assessment & Plan:   Problem List Items Addressed This Visit       Cardiovascular and Mediastinum   Hot flashes due to menopause    Improved with Celexa, however this is causing side effects.  Will trial change to Effexor and see if offers benefit.        Respiratory   Seasonal allergic rhinitis due to pollen    Chronic, stable, continue current medication regimen and adjust as needed.  Refills as needed.      Sinusitis    Acute for 6 days, at this time due to symptoms and length of these will start Augmentin BID.  Recommend: - Increased rest - Increasing Fluids - Acetaminophen as needed for fever/pain.  - Salt water gargling, chloraseptic spray and throat lozenges - Mucinex.  - Humidifying the air.       Relevant Medications   amoxicillin-clavulanate (AUGMENTIN) 875-125 MG tablet     Endocrine   Hyperlipidemia associated with type 2 diabetes mellitus (HCC)    Chronic, ongoing.  Repeat lipid panel today and focus on diet.  Fasting labs.  Continue Lovaza and Crestor at this time.      Relevant Orders  Bayer DCA Hb A1c Waived   Comprehensive metabolic panel   Lipid Panel w/o Chol/HDL Ratio   Type 2 diabetes mellitus with hyperglycemia [facility]) - Primary    Diagnosed August 2023 with A1c 6.5%, A1c last visit 5.9%, will recheck today.  Check urine ALB.  Continues to lose weight with Metformin and diet + exercise.  Is tolerating Metformin, will continue this as is offering benefit to weight loss.  Continue to monitor blood sugar at home daily and document.  Continue focus on diabetic  diet.        Relevant Orders   Bayer DCA Hb A1c Waived   Microalbumin, Urine Waived   CBC with Differential/Platelet   Comprehensive metabolic panel   TSH     Genitourinary   OAB (overactive bladder)    Chronic, ongoing, mixed with stress incontinence.  Continue Ditropan daily as offering her benefit.  Discussed pelvic floor physical therapy, she has not tried this, did not attend therapy that was ordered due to time constraints. Recommend she perform exercises at home.        Other   Generalized anxiety disorder    Chronic, exacerbated.  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  Denies SI/HI.  Celexa causing side effects.  Will perform direct switch to Effexor, discussed with patient.  Start at Effexor 37.5 MG daily and stop Celexa -- may continue offer benefit to menopausal symptoms and mood.  Continue Trazodone PRN 25-50 MG at HS for short period for sleep needs.  Continue Buspar 5 MG BID to help with anxiety and may increase to 10 MG BID if needed in future.  Discussed various methods of meditation and relaxation techniques at home.  Recommend she get palliative referral for her mother and looking in hospice caregiver support group.  Return in 4 weeks.      Relevant Medications   venlafaxine XR (EFFEXOR XR) 37.5 MG 24 hr capsule   Stress incontinence in female    Refer to OAB plan of care.      Urinary symptom or sign    Acute for 3 days. Check UA and wet prep today, treat as needed.  Recommend she continue Azo and increased fluid intake.  May take Tylenol as needed for fever or discomfort.  Recommend if worsening symptoms over weekend, such as increased pain or fever, then to present immediately to UC or ER.  Return to office for worsening or ongoing symptoms.        Relevant Orders   Urinalysis, Routine w reflex microscopic   WET PREP FOR TRICH, YEAST, CLUE   Vitamin D deficiency    Chronic, ongoing.  Continue supplement and recheck Vitamin D level today.  May  consider return to Vit D weekly if on lower side.      Relevant Orders   VITAMIN D 25 Hydroxy (Vit-D Deficiency, Fractures)   Other Visit Diagnoses     Encounter for screening mammogram for malignant neoplasm of breast       Mammogram ordered and provided # to all and schedule.   Relevant Orders   MM 3D SCREEN BREAST BILATERAL   Encounter for annual physical exam       Annual physical today with labs and health maintenance reviewed, discussed with patient.        Follow up plan: Return in about 4 weeks (around 11/30/2022) for MOOD -- changed to Effexor.   LABORATORY TESTING:  - Pap smear: not applicable  IMMUNIZATIONS:   - Tdap: Tetanus  vaccination status reviewed: last tetanus booster within 10 years. - Influenza: Up to date - Pneumovax: Not applicable - Prevnar: Administered today - COVID: Up to date - HPV: Not applicable - Shingrix vaccine: Up to date  SCREENING: -Mammogram: Ordered today  - Colonoscopy: Up to date  - Bone Density: Not applicable  -Hearing Test: Not applicable  -Spirometry: Not applicable   PATIENT COUNSELING:   Advised to take 1 mg of folate supplement per day if capable of pregnancy.   Sexuality: Discussed sexually transmitted diseases, partner selection, use of condoms, avoidance of unintended pregnancy  and contraceptive alternatives.   Advised to avoid cigarette smoking.  I discussed with the patient that most people either abstain from alcohol or drink within safe limits (<=14/week and <=4 drinks/occasion for males, <=7/weeks and <= 3 drinks/occasion for females) and that the risk for alcohol disorders and other health effects rises proportionally with the number of drinks per week and how often a drinker exceeds daily limits.  Discussed cessation/primary prevention of drug use and availability of treatment for abuse.   Diet: Encouraged to adjust caloric intake to maintain  or achieve ideal body weight, to reduce intake of dietary saturated  fat and total fat, to limit sodium intake by avoiding high sodium foods and not adding table salt, and to maintain adequate dietary potassium and calcium preferably from fresh fruits, vegetables, and low-fat dairy products.    Stressed the importance of regular exercise  Injury prevention: Discussed safety belts, safety helmets, smoke detector, smoking near bedding or upholstery.   Dental health: Discussed importance of regular tooth brushing, flossing, and dental visits.    NEXT PREVENTATIVE PHYSICAL DUE IN 1 YEAR. Return in about 4 weeks (around 11/30/2022) for MOOD -- changed to Effexor.

## 2022-11-02 NOTE — Assessment & Plan Note (Signed)
Acute for 3 days. Check UA and wet prep today, treat as needed.  Recommend she continue Azo and increased fluid intake.  May take Tylenol as needed for fever or discomfort.  Recommend if worsening symptoms over weekend, such as increased pain or fever, then to present immediately to UC or ER.  Return to office for worsening or ongoing symptoms.

## 2022-11-02 NOTE — Assessment & Plan Note (Signed)
Chronic, stable, continue current medication regimen and adjust as needed.  Refills as needed.

## 2022-11-02 NOTE — Assessment & Plan Note (Signed)
Chronic, ongoing, mixed with stress incontinence.  Continue Ditropan daily as offering her benefit.  Discussed pelvic floor physical therapy, she has not tried this, did not attend therapy that was ordered due to time constraints. Recommend she perform exercises at home.

## 2022-11-02 NOTE — Assessment & Plan Note (Signed)
Chronic, ongoing.  Repeat lipid panel today and focus on diet.  Fasting labs.  Continue Lovaza and Crestor at this time.

## 2022-11-02 NOTE — Assessment & Plan Note (Signed)
Improved with Celexa, however this is causing side effects.  Will trial change to Effexor and see if offers benefit.

## 2022-11-02 NOTE — Assessment & Plan Note (Signed)
Acute for 6 days, at this time due to symptoms and length of these will start Augmentin BID.  Recommend: - Increased rest - Increasing Fluids - Acetaminophen as needed for fever/pain.  - Salt water gargling, chloraseptic spray and throat lozenges - Mucinex.  - Humidifying the air.

## 2022-11-02 NOTE — Addendum Note (Signed)
Addended by: Marnee Guarneri T on: 11/02/2022 10:22 AM   Modules accepted: Orders

## 2022-11-03 ENCOUNTER — Encounter: Payer: Self-pay | Admitting: Nurse Practitioner

## 2022-11-04 LAB — CBC WITH DIFFERENTIAL/PLATELET
Basophils Absolute: 0 10*3/uL (ref 0.0–0.2)
Basos: 1 %
EOS (ABSOLUTE): 0.1 10*3/uL (ref 0.0–0.4)
Eos: 1 %
Hematocrit: 39.1 % (ref 34.0–46.6)
Hemoglobin: 12.4 g/dL (ref 11.1–15.9)
Immature Grans (Abs): 0 10*3/uL (ref 0.0–0.1)
Immature Granulocytes: 0 %
Lymphocytes Absolute: 1.5 10*3/uL (ref 0.7–3.1)
Lymphs: 19 %
MCH: 30 pg (ref 26.6–33.0)
MCHC: 31.7 g/dL (ref 31.5–35.7)
MCV: 94 fL (ref 79–97)
Monocytes Absolute: 0.5 10*3/uL (ref 0.1–0.9)
Monocytes: 7 %
Neutrophils Absolute: 5.8 10*3/uL (ref 1.4–7.0)
Neutrophils: 72 %
Platelets: 144 10*3/uL — ABNORMAL LOW (ref 150–450)
RBC: 4.14 x10E6/uL (ref 3.77–5.28)
RDW: 12.3 % (ref 11.7–15.4)
WBC: 8 10*3/uL (ref 3.4–10.8)

## 2022-11-04 LAB — COMPREHENSIVE METABOLIC PANEL
ALT: 13 IU/L (ref 0–32)
AST: 18 IU/L (ref 0–40)
Albumin/Globulin Ratio: 2.8 — ABNORMAL HIGH (ref 1.2–2.2)
Albumin: 4.7 g/dL (ref 3.9–4.9)
Alkaline Phosphatase: 89 IU/L (ref 44–121)
BUN/Creatinine Ratio: 11 — ABNORMAL LOW (ref 12–28)
BUN: 10 mg/dL (ref 8–27)
Bilirubin Total: 0.3 mg/dL (ref 0.0–1.2)
CO2: 22 mmol/L (ref 20–29)
Calcium: 9.4 mg/dL (ref 8.7–10.3)
Chloride: 100 mmol/L (ref 96–106)
Creatinine, Ser: 0.94 mg/dL (ref 0.57–1.00)
Globulin, Total: 1.7 g/dL (ref 1.5–4.5)
Glucose: 83 mg/dL (ref 70–99)
Potassium: 4.5 mmol/L (ref 3.5–5.2)
Sodium: 139 mmol/L (ref 134–144)
Total Protein: 6.4 g/dL (ref 6.0–8.5)
eGFR: 69 mL/min/{1.73_m2} (ref >59)

## 2022-11-04 LAB — TSH: TSH: 1.57 u[IU]/mL (ref 0.450–4.500)

## 2022-11-04 LAB — LIPID PANEL W/O CHOL/HDL RATIO
Cholesterol, Total: 105 mg/dL (ref 100–199)
HDL: 39 mg/dL — ABNORMAL LOW (ref >39)
LDL Chol Calc (NIH): 24 mg/dL (ref 0–99)
Triglycerides: 283 mg/dL — ABNORMAL HIGH (ref 0–149)
VLDL Cholesterol Cal: 42 mg/dL — ABNORMAL HIGH (ref 5–40)

## 2022-11-04 LAB — VITAMIN D 25 HYDROXY (VIT D DEFICIENCY, FRACTURES): Vit D, 25-Hydroxy: 77.7 ng/mL (ref 30.0–100.0)

## 2022-11-04 NOTE — Progress Notes (Signed)
Contacted via MyChart   Good morning Karen Lambert, your labs have returned: - Kidney function, creatinine and eGFR, remains normal, as is liver function, AST and ALT.  - CBC is overall stable with no anemia or infection, platelets a little low (these help clot the blood) and we will recheck next visit. - Cholesterol levels show goal LDL with Rosuvastatin, but triglycerides still a bit elevated.  Please continue both statin and the Omega 3 (Lovaza) as ordered. Focus on diet changes. - Remainder of labs all stable.  Any questions? Keep being awesome!!  Thank you for allowing me to participate in your care.  I appreciate you. Kindest regards, Petra Sargeant

## 2022-11-06 ENCOUNTER — Encounter: Payer: Self-pay | Admitting: Nurse Practitioner

## 2022-11-06 LAB — URINE CULTURE

## 2022-11-06 NOTE — Progress Notes (Signed)
Contacted via MyChart   Good afternoon, no infection noted in urine:)  Great news!!

## 2022-11-12 NOTE — Patient Instructions (Incomplete)
Stomatitis Stomatitis is a condition that causes irritation and swelling (inflammation) in the mouth. It can affect all or part of the inside of the mouth. The condition often affects the cheek, teeth, gums, lips, and tongue. It can also affect the tissues that produce mucus in the mouth (mucosa). Pain from stomatitis can make it hard for you to eat or drink. Very bad cases of this condition can lead to: Not getting enough fluid in your body (dehydration). Poor nutrition. What are the causes? Infections caused by germs (viruses, bacteria, or fungi). Examples include cold sores, shingles, and oral thrush. Cancer treatment, including chemotherapy and radiation therapy. Not getting enough of certain vitamins (vitamin deficiency). Not getting proper nutrition. Injury to the mouth. This can be from: Dentures or braces that do not fit well. Biting your tongue or cheek. Burning your mouth. Having sharp or broken teeth. Gum disease. Using tobacco, especially chewing tobacco. Allergies to foods, medicines, or substances that are used in your mouth. Certain medicines. In some cases, the cause may not be known. What increases the risk? Not taking good care of your mouth and teeth (poor oral hygiene). Having poor nutrition. Having any condition that weakens the body's defense system (immune system). Being treated for cancer. Using tobacco products. Having any condition that causes a dry mouth. Being under a lot of physical or emotional stress. What are the signs or symptoms? The most common symptoms of this condition are pain, swelling, and redness inside your mouth. The pain may feel like burning or stinging. It may get worse from eating or drinking.  Other symptoms may include: Painful, shallow sores (ulcers) in the mouth. White patches in the mouth. Blisters in the mouth. Bleeding gums. Bad breath. Bad taste in the mouth. Fever. How is this treated? Treatment depends on the cause.  Treatment may include: Over-the-counter pain medicines or medicines to coat or numb your mouth. Gel, cream, or spray to numb the area (topical anesthetic) if you have very bad pain. Medicines to treat an infection. Vitamins. Mouth rinses to reduce swelling (steroids). You may also need to stop or change any medicines that might be causing the condition. Follow these instructions at home: Medicines Take over-the-counter and prescription medicines only as told by your doctor. If you were prescribed an antibiotic medicine, take it as told by your doctor. Do not stop taking it even if you start to feel better. Do not use products that have benzocaine in them to treat a child younger than 2 years. This includes gels for teething or mouth pain. Ask your doctor if you should avoid driving or using machines while you are taking your medicine. Eating and drinking Eat a balanced diet. Do not eat: Spicy foods. Citrus, such as oranges. Foods that have sharp edges, such as chips. Do not eat any foods that you think may be causing this condition. Do not drink alcohol. Drink enough fluid to keep your pee (urine) pale yellow. This will keep you hydrated. Lifestyle     Take good care of your mouth and teeth: Gently brush your teeth with a soft toothbrush. Do this 2 times each day. Floss your teeth every day. Have your teeth cleaned regularly. Do this as told by your dentist. If you have dentures, make sure that they fit the way that they should. Clean them often as told by your dentist. Do not smoke or use any products that contain nicotine or tobacco. If you need help quitting, ask your doctor. Find ways to  lower your stress. Try yoga or meditation. Ask your doctor for other ideas. General instructions Rinse your mouth often with salt water. To make salt water, dissolve -1 tsp (3-6 g) of salt in 1 cup (237 mL) of warm water. Keep all follow-up visits. Contact a doctor if: Your symptoms get  worse. You have new symptoms, like: A rash. New symptoms that do not involve your mouth area. Your symptoms last longer than 3 weeks. Your symptoms go away and then come back. You feel very tired (fatigued). You feel weaker. You do not want to eat as much as normal (loss of appetite). You feel like you may vomit (nauseous). Get help right away if: You have a fever. You are not able to eat or drink. You have a lot of bleeding in your mouth. Summary Stomatitis is a condition that causes irritation and swelling in the mouth. Pain from stomatitis can make it hard for you to eat or drink. Very bad cases of this condition can lead to not getting enough fluid in your body or poor nutrition. Take medicines only as told by your doctor. Follow instructions from your doctor on diet and lifestyle changes to help manage your symptoms. This information is not intended to replace advice given to you by your health care provider. Make sure you discuss any questions you have with your health care provider. Document Revised: 06/09/2021 Document Reviewed: 06/09/2021 Elsevier Patient Education  [employer].

## 2022-11-15 ENCOUNTER — Ambulatory Visit: Payer: Managed Care, Other (non HMO) | Admitting: Nurse Practitioner

## 2022-11-15 ENCOUNTER — Encounter: Payer: Self-pay | Admitting: Nurse Practitioner

## 2022-11-15 VITALS — BP 116/70 | HR 57 | Temp 97.8°F | Ht 62.99 in | Wt 151.0 lb

## 2022-11-15 DIAGNOSIS — K130 Diseases of lips: Secondary | ICD-10-CM | POA: Diagnosis not present

## 2022-11-15 MED ORDER — VALACYCLOVIR HCL 1 G PO TABS: 1000.0000 mg | ORAL_TABLET | Freq: Every day | ORAL | 0 refills | Status: AC

## 2022-11-15 NOTE — Assessment & Plan Note (Signed)
?  angular cheilitis.  History of lower normal B12 prior to Metformin use.  Recommend she start Vitamin B12 1000 MCG daily and start use Aquaphor 4-5 times daily to moisturize lips + zinc barrier cream at night to corners.  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  Returns as scheduled.

## 2022-11-15 NOTE — Progress Notes (Signed)
BP 116/70   Pulse (!) 57   Temp 97.8 F (36.6 C) (Oral)   Ht 5' 2.99" (1.6 m)   Wt 151 lb (68.5 kg)   SpO2 96%   BMI 26.76 kg/m    Subjective:    Patient ID: Karen Lambert, female    DOB: [birth year], 62 y.o.   MRN: [id number]  HPI: Karen Lambert is a 62 y.o. female  Chief Complaint  Patient presents with   dry lips    For past 3 weeks or more. Have been peeling, red, tingling and cracking    DRY LIPS Having peeling, redness, and tingling + cracking to lips.  Feels sensation like cold sore coming, but nothing flares.  Lips getting really chapped and corners are cracking open.  Wears upper and lower partials.  Last B12 check one year ago, 445. Is painful at times.  No mouth wash at home and uses [employer].  Is on Metformin. Duration: weeks Location: lips  History of trauma in area: no Redness: yes Swelling: yes Oozing: no Pus: no Fevers: no Nausea/vomiting: no Status: fluctuating Treatments attempted:OTC chapstick, Abreva, Carmex  Relevant past medical, surgical, family and social history reviewed and updated as indicated. Interim medical history since our last visit reviewed. Allergies and medications reviewed and updated.  Review of Systems  Constitutional:  Negative for activity change, appetite change, diaphoresis, fatigue and fever.  Respiratory:  Negative for cough, chest tightness and shortness of breath.   Cardiovascular:  Negative for chest pain, palpitations and leg swelling.  Gastrointestinal: Negative.   Neurological: Negative.   Psychiatric/Behavioral: Negative.      Per HPI unless specifically indicated above     Objective:    BP 116/70   Pulse (!) 57   Temp 97.8 F (36.6 C) (Oral)   Ht 5' 2.99" (1.6 m)   Wt 151 lb (68.5 kg)   SpO2 96%   BMI 26.76 kg/m   Wt Readings from Last 3 Encounters:  11/15/22 151 lb (68.5 kg)  11/02/22 150 lb 12.8 oz (68.4 kg)  08/08/22 153 lb 4.8 oz (69.5 kg)    Physical Exam Vitals and nursing note reviewed.   Constitutional:      General: She is awake. She is not in acute distress.    Appearance: She is well-developed and well-groomed. She is not ill-appearing or toxic-appearing.  HENT:     Head: Normocephalic.     Right Ear: Hearing and external ear normal.     Left Ear: Hearing and external ear normal.     Mouth/Throat:     Lips: No lesions.     Mouth: Mucous membranes are moist. No injury, oral lesions or angioedema.     Comments: Lips upper and lower with moderate erythema and mild cracking to corners.  No lesions. Eyes:     General: Lids are normal.        Right eye: No discharge.        Left eye: No discharge.     Conjunctiva/sclera: Conjunctivae normal.     Pupils: Pupils are equal, round, and reactive to light.  Neck:     Thyroid: No thyromegaly.     Vascular: No carotid bruit or JVD.  Cardiovascular:     Rate and Rhythm: Normal rate and regular rhythm.     Heart sounds: Normal heart sounds. No murmur heard.    No gallop.  Pulmonary:     Effort: Pulmonary effort is normal.     Breath  sounds: Normal breath sounds.  Abdominal:     General: Bowel sounds are normal.     Palpations: Abdomen is soft. There is no hepatomegaly or splenomegaly.  Musculoskeletal:     Cervical back: Normal range of motion and neck supple.     Right lower leg: No edema.     Left lower leg: No edema.  Lymphadenopathy:     Cervical: No cervical adenopathy.  Skin:    General: Skin is warm and dry.  Neurological:     Mental Status: She is alert and oriented to person, place, and time.  Psychiatric:        Attention and Perception: Attention normal.        Mood and Affect: Mood normal.        Behavior: Behavior normal. Behavior is cooperative.        Thought Content: Thought content normal.        Judgment: Judgment normal.     Results for orders placed or performed in visit on 11/03/22  HM DIABETES EYE EXAM  Result Value Ref Range   HM Diabetic Eye Exam No Retinopathy No Retinopathy       Assessment & Plan:   Problem List Items Addressed This Visit       Other   Cracked lips - Primary    ?angular cheilitis.  History of lower normal B12 prior to Metformin use.  Recommend she start Vitamin B12 1000 MCG daily and start use Aquaphor 4-5 times daily to moisturize lips + zinc barrier cream at night to corners.  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  Returns as scheduled.        Follow up plan: Return if symptoms worsen or fail to improve.

## 2022-11-17 ENCOUNTER — Encounter: Payer: Self-pay | Admitting: Nurse Practitioner

## 2022-11-17 ENCOUNTER — Encounter: Payer: Managed Care, Other (non HMO) | Admitting: Nurse Practitioner

## 2022-11-20 MED ORDER — GLUCOSE BLOOD VI STRP: ORAL_STRIP | 12 refills | Status: DC

## 2022-11-21 ENCOUNTER — Other Ambulatory Visit: Payer: Self-pay

## 2022-11-21 DIAGNOSIS — E1165 Type 2 diabetes mellitus with hyperglycemia: Secondary | ICD-10-CM

## 2022-11-21 MED ORDER — GLUCOSE BLOOD VI STRP: ORAL_STRIP | 12 refills | Status: AC

## 2022-11-22 ENCOUNTER — Ambulatory Visit
Admission: RE | Admit: 2022-11-22 | Discharge: 2022-11-22 | Disposition: A | Discharge status: Home / Self Care | Payer: Managed Care, Other (non HMO) | Source: Ambulatory Visit | Attending: Nurse Practitioner | Admitting: Nurse Practitioner

## 2022-11-22 DIAGNOSIS — Z1231 Encounter for screening mammogram for malignant neoplasm of breast: Secondary | ICD-10-CM | POA: Diagnosis present

## 2022-11-23 MED ORDER — FLUCONAZOLE 150 MG PO TABS: 150.0000 mg | ORAL_TABLET | Freq: Once | ORAL | 0 refills | Status: AC

## 2022-11-23 MED ORDER — VALACYCLOVIR HCL 1 G PO TABS: 1000.0000 mg | ORAL_TABLET | Freq: Two times a day (BID) | ORAL | 0 refills | Status: DC

## 2022-11-23 NOTE — Progress Notes (Signed)
Contacted via MyChart   Normal mammogram, may repeat in one year:)

## 2022-12-02 NOTE — Patient Instructions (Incomplete)
Get some Dr. Clayborne Artist CortiBalm.  Dry Skin Care  What causes dry skin?  Dry skin is common and results from inadequate moisture in the outer skin layers. Dry skin usually results from the excessive loss of moisture from the skin surface. This occurs due to two major factors: Normally the skin's oil glands deposit a layer of oil on the skin's surface. This layer of oil prevents the loss of moisture from the skin. Exposure to soaps, cleaners, solvents, and disinfectants removes this oily film, allowing water to escape. Water loss from the skin increases when the humidity is low. During winter months we spend a lot of time indoors where the air is heated. Heated air has very low humidity. This also contributes to dry skin.  A tendency for dry skin may accompany such disorders as eczema. Also, as people age, the number of functioning oil glands decreases, and the tendency toward dry skin can be a sensation of skin tightness when emerging from the shower.  How do I manage dry skin?  Humidify your environment. This can be accomplished by using a humidifier in your bedroom at night during winter months. Bathing can actually put moisture back into your skin if done right. Take the following steps while bathing to sooth dry skin: Avoid hot water, which only dries the skin and makes itching worse. Use warm water. Avoid washcloths or extensive rubbing or scrubbing. Use mild soaps like unscented Dove, Oil of Olay, Cetaphil, Basis, or CeraVe. If you take baths rather than showers, rinse off soap residue with clean water before getting out of tub. Once out of the shower/tub, pat dry gently with a soft towel. Leave your skin damp. While still damp, apply any medicated ointment/cream you were prescribed to the affected areas. After you apply your medicated ointment/cream, then apply your moisturizer to your whole body.This is the most important step in dry skin care. If this is omitted, your skin will continue to  be dry. The choice of moisturizer is also very important. In general, lotion will not provider enough moisture to severely dry skin because it is water based. You should use an ointment or cream. Moisturizers should also be unscented. Good choices include Vaseline (plain petrolatum), Aquaphor, Cetaphil, CeraVe, Vanicream, DML Forte, Aveeno moisture, or Eucerin Cream. Bath oils can be helpful, but do not replace the application of moisturizer after the bath. In addition, they make the tub slippery causing an increased risk for falls. Therefore, we do not recommend their use.

## 2022-12-03 ENCOUNTER — Other Ambulatory Visit: Payer: Self-pay | Admitting: Nurse Practitioner

## 2022-12-06 ENCOUNTER — Encounter: Payer: Self-pay | Admitting: Nurse Practitioner

## 2022-12-06 ENCOUNTER — Ambulatory Visit: Payer: Managed Care, Other (non HMO) | Admitting: Nurse Practitioner

## 2022-12-06 VITALS — BP 114/76 | HR 54 | Temp 97.6°F | Ht 62.99 in | Wt 147.1 lb

## 2022-12-06 DIAGNOSIS — K130 Diseases of lips: Secondary | ICD-10-CM

## 2022-12-06 DIAGNOSIS — E538 Deficiency of other specified B group vitamins: Secondary | ICD-10-CM

## 2022-12-06 DIAGNOSIS — F411 Generalized anxiety disorder: Secondary | ICD-10-CM

## 2022-12-06 MED ORDER — "SYRINGE 20G X 1-1/2"" 3 ML MISC": 0 refills | Status: DC

## 2022-12-06 MED ORDER — CYANOCOBALAMIN 1000 MCG/ML IJ SOLN: 1000.0000 ug | INTRAMUSCULAR | 4 refills | Status: DC

## 2022-12-06 MED ORDER — BUSPIRONE HCL 10 MG PO TABS: 10.0000 mg | ORAL_TABLET | Freq: Two times a day (BID) | ORAL | 4 refills | Status: DC

## 2022-12-06 MED ORDER — CYANOCOBALAMIN 1000 MCG/ML IJ SOLN: 1000.0000 ug | Freq: Once | INTRAMUSCULAR | Status: DC

## 2022-12-06 MED ORDER — CYANOCOBALAMIN 1000 MCG/ML IJ SOLN
1000.0000 ug | Freq: Once | INTRAMUSCULAR | Status: AC
  Administered 2022-12-06: 1000 ug via INTRAMUSCULAR

## 2022-12-06 NOTE — Progress Notes (Signed)
BP 114/76   Pulse (!) 54   Temp 97.6 F (36.4 C) (Oral)   Ht 5' 2.99" (1.6 m)   Wt 147 lb 1.6 oz (66.7 kg)   SpO2 97%   BMI 26.06 kg/m    Subjective:    Patient ID: Karen Lambert, female    DOB: Feb 14, 1961, 62 y.o.   MRN: [id number]  HPI: Karen Lambert is a 62 y.o. female  Chief Complaint  Patient presents with   Cracked lips   DRY LIPS Continues to have chafed lips. Lips getting really chapped and corners are cracking open.  Wears upper and lower partials.  Last B12 check one year ago, 445. Is painful at times.  No mouth wash at home and uses [employer].  Is on Metformin.  Tried Valtrex without benefit + Diflucan.  Using Aquaphor. Duration: weeks Location: lips  History of trauma in area: no Redness: yes Swelling: yes Oozing: no Pus: no Fevers: no Nausea/vomiting: no Status: fluctuating Treatments attempted: OTC chapstick, Abreva, Carmex  DEPRESSION/ANXIETY Changed to Effexor last visit, but side effects with this.  Returned to Celexa 40 MG daily and Buspar 5 MG BID.  Mood status: exacerbated with caring for mother Satisfied with current treatment?: yes Symptom severity: moderate  Duration of current treatment : chronic Side effects: no Medication compliance: good compliance Previous psychiatric medications: Effexor and Celexa Depressed mood: yes Anxious mood: yes Anhedonia: no Significant weight loss or gain: no Insomnia: occasional Fatigue: yes Feelings of worthlessness or guilt: yes Impaired concentration/indecisiveness: no Suicidal ideations: no Hopelessness: yes Crying spells: yes    12/06/2022    8:49 AM 11/02/2022    8:15 AM 08/08/2022    9:11 AM 06/27/2022    9:00 AM 05/02/2022    8:26 AM  Depression screen PHQ 2/9  Decreased Interest 2 2 3 2  0  Down, Depressed, Hopeless 2 2 2 2 1   PHQ - 2 Score 4 4 5 4 1   Altered sleeping 2 2 3 2 3   Tired, decreased energy 2 3 3 3 2   Change in appetite 2 2 3 2 2   Feeling bad or failure about yourself  2 2 3  2 3   Trouble concentrating 3 3 3 2 3   Moving slowly or fidgety/restless 0 0 2 0 0  Suicidal thoughts 0 1 1 0 0  PHQ-9 Score 15 17 23 15 14   Difficult doing work/chores Somewhat difficult Somewhat difficult  Somewhat difficult Somewhat difficult       12/06/2022    8:49 AM 11/02/2022    8:15 AM 08/08/2022    9:11 AM 06/27/2022    9:01 AM  GAD 7 : Generalized Anxiety Score  Nervous, Anxious, on Edge 3 3 3 3   Control/stop worrying 3 3 3 3   Worry too much - different things 3 3 3 3   Trouble relaxing 3 3 3 3   Restless 2 1 2 3   Easily annoyed or irritable 3 3 3 3   Afraid - awful might happen 2 3 2 3   Total GAD 7 Score 19 19 19 21   Anxiety Difficulty Somewhat difficult Somewhat difficult Somewhat difficult Somewhat difficult   Relevant past medical, surgical, family and social history reviewed and updated as indicated. Interim medical history since our last visit reviewed. Allergies and medications reviewed and updated.  Review of Systems  Constitutional:  Negative for activity change, appetite change, diaphoresis, fatigue and fever.  Respiratory:  Negative for cough, chest tightness and shortness of breath.  Cardiovascular:  Negative for chest pain, palpitations and leg swelling.  Gastrointestinal: Negative.   Neurological: Negative.   Psychiatric/Behavioral:  Positive for decreased concentration and sleep disturbance. Negative for self-injury and suicidal ideas. The patient is nervous/anxious.     Per HPI unless specifically indicated above     Objective:    BP 114/76   Pulse (!) 54   Temp 97.6 F (36.4 C) (Oral)   Ht 5' 2.99" (1.6 m)   Wt 147 lb 1.6 oz (66.7 kg)   SpO2 97%   BMI 26.06 kg/m   Wt Readings from Last 3 Encounters:  12/06/22 147 lb 1.6 oz (66.7 kg)  11/15/22 151 lb (68.5 kg)  11/02/22 150 lb 12.8 oz (68.4 kg)    Physical Exam Vitals and nursing note reviewed.  Constitutional:      General: She is awake. She is not in acute distress.    Appearance: She  is well-developed and well-groomed. She is not ill-appearing or toxic-appearing.  HENT:     Head: Normocephalic.     Right Ear: Hearing and external ear normal.     Left Ear: Hearing and external ear normal.     Mouth/Throat:     Lips: No lesions.     Mouth: Mucous membranes are moist. No injury, oral lesions or angioedema.     Comments: Lips upper and lower with moderate erythema and mild cracking to corners.  No lesions. Eyes:     General: Lids are normal.        Right eye: No discharge.        Left eye: No discharge.     Conjunctiva/sclera: Conjunctivae normal.     Pupils: Pupils are equal, round, and reactive to light.  Neck:     Thyroid: No thyromegaly.     Vascular: No carotid bruit or JVD.  Cardiovascular:     Rate and Rhythm: Normal rate and regular rhythm.     Heart sounds: Normal heart sounds. No murmur heard.    No gallop.  Pulmonary:     Effort: Pulmonary effort is normal.     Breath sounds: Normal breath sounds.  Abdominal:     General: Bowel sounds are normal.     Palpations: Abdomen is soft. There is no hepatomegaly or splenomegaly.  Musculoskeletal:     Cervical back: Normal range of motion and neck supple.     Right lower leg: No edema.     Left lower leg: No edema.  Lymphadenopathy:     Cervical: No cervical adenopathy.  Skin:    General: Skin is warm and dry.  Neurological:     Mental Status: She is alert and oriented to person, place, and time.  Psychiatric:        Attention and Perception: Attention normal.        Mood and Affect: Mood normal.        Behavior: Behavior normal. Behavior is cooperative.        Thought Content: Thought content normal.        Judgment: Judgment normal.     Results for orders placed or performed in visit on 11/03/22  HM DIABETES EYE EXAM  Result Value Ref Range   HM Diabetic Eye Exam No Retinopathy No Retinopathy      Assessment & Plan:   Problem List Items Addressed This Visit       Other   B12 deficiency     B12 injection in office today and recheck level. Supplies sent  to inject B12 at home, which she is comfortable doing. Will do so every 30 days.       Relevant Medications   cyanocobalamin (VITAMIN B12) injection 1,000 mcg (Start on 12/06/2022  9:30 AM)   Other Relevant Orders   Vitamin B12   Cracked lips    ?angular cheilitis.  History of lower normal B12 prior to Metformin use, will recheck today.  Oral B12 not offering much benefit, will trial B12 injections monthly.  Initial today.  Start use Cortibalm 3 times daily + zinc barrier cream at night to corners.  Return in 4 weeks.      Relevant Orders   Vitamin B12   Generalized anxiety disorder - Primary    Chronic, exacerbated.  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  Denies SI/HI.  Continue Celexa 40 MG daily and increase Buspar to 10 MG BID.  Continue Trazodone PRN 25-50 MG at HS for short period for sleep needs. Discussed various methods of meditation and relaxation techniques at home.  Recommend she get palliative referral for her mother and looking in hospice caregiver support group.  Return in 4 weeks.      Relevant Medications   citalopram (CELEXA) 40 MG tablet   busPIRone (BUSPAR) 10 MG tablet     Follow up plan: Return in about 4 weeks (around 01/03/2023) for Cracked Lips and Mood -- increase Buspar 10 MG BID.

## 2022-12-06 NOTE — Assessment & Plan Note (Signed)
B12 injection in office today and recheck level. Supplies sent to inject B12 at home, which she is comfortable doing. Will do so every 30 days.

## 2022-12-06 NOTE — Assessment & Plan Note (Signed)
?  angular cheilitis.  History of lower normal B12 prior to Metformin use, will recheck today.  Oral B12 not offering much benefit, will trial B12 injections monthly.  Initial today.  Start use Cortibalm 3 times daily + zinc barrier cream at night to corners.  Return in 4 weeks.

## 2022-12-06 NOTE — Assessment & Plan Note (Signed)
Chronic, exacerbated.  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  Denies SI/HI.  Continue Celexa 40 MG daily and increase Buspar to 10 MG BID.  Continue Trazodone PRN 25-50 MG at HS for short period for sleep needs. Discussed various methods of meditation and relaxation techniques at home.  Recommend she get palliative referral for her mother and looking in hospice caregiver support group.  Return in 4 weeks.

## 2022-12-07 LAB — VITAMIN B12: Vitamin B-12: >2000 pg/mL — ABNORMAL HIGH (ref 232–1245)

## 2022-12-07 NOTE — Progress Notes (Signed)
Contacted via MyChart   Good morning, B12 level actually looks good with oral supplement but we will try injection to see if improvement to lips.:)

## 2022-12-27 ENCOUNTER — Other Ambulatory Visit: Payer: Self-pay

## 2022-12-27 MED ORDER — TRAZODONE HCL 50 MG PO TABS: ORAL_TABLET | ORAL | 3 refills | Status: DC

## 2022-12-31 NOTE — Patient Instructions (Signed)
Managing Anxiety, Adult After being diagnosed with anxiety, you may be relieved to know why you have felt or behaved a certain way. You may also feel overwhelmed about the treatment ahead and what it will mean for your life. With care and support, you can manage your anxiety. How to manage lifestyle changes Understanding the difference between stress and anxiety Although stress can play a role in anxiety, it is not the same as anxiety. Stress is your body's reaction to life changes and events, both good and bad. Stress is often caused by something external, such as a deadline, test, or competition. It normally goes away after the event has ended and will last just a few hours. But, stress can be ongoing and can lead to more than just stress. Anxiety is caused by something internal, such as imagining a terrible outcome or worrying that something will go wrong that will greatly upset you. Anxiety often does not go away even after the event is over, and it can become a long-term (chronic) worry. Lowering stress and anxiety Talk with your health care provider or a counselor to learn more about lowering anxiety and stress. They may suggest tension-reduction techniques, such as: Music. Spend time creating or listening to music that you enjoy and that inspires you. Mindfulness-based meditation. Practice being aware of your normal breaths while not trying to control your breathing. It can be done while sitting or walking. Centering prayer. Focus on a word, phrase, or sacred image that means something to you and brings you peace. Deep breathing. Expand your stomach and inhale slowly through your nose. Hold your breath for 3-5 seconds. Then breathe out slowly, letting your stomach muscles relax. Self-talk. Learn to notice and spot thought patterns that lead to anxiety reactions. Change those patterns to thoughts that feel peaceful. Muscle relaxation. Take time to tense muscles and then relax them. Choose a  tension-reduction technique that fits your lifestyle and personality. These techniques take time and practice. Set aside 5-15 minutes a day to do them. Specialized therapists can offer counseling and training in these techniques. The training to help with anxiety may be covered by some insurance plans. Other things you can do to manage stress and anxiety include: Keeping a stress diary. This can help you learn what triggers your reaction and then learn ways to manage your response. Thinking about how you react to certain situations. You may not be able to control everything, but you can control your response. Making time for activities that help you relax and not feeling guilty about spending your time in this way. Doing visual imagery. This involves imagining or creating mental pictures to help you relax. Practicing yoga. Through yoga poses, you can lower tension and relax.  Medicines Medicines for anxiety include: Antidepressant medicines. These are usually prescribed for long-term daily control. Anti-anxiety medicines. These may be added in severe cases, especially when panic attacks occur. When used together, medicines, psychotherapy, and tension-reduction techniques may be the most effective treatment. Relationships Relationships can play a big part in helping you recover. Spend more time connecting with trusted friends and family members. Think about going to couples counseling if you have a partner, taking family education classes, or going to family therapy. Therapy can help you and others better understand your anxiety. How to recognize changes in your anxiety Everyone responds differently to treatment for anxiety. Recovery from anxiety happens when symptoms lessen and stop interfering with your daily life at home or work. This may mean that you   will start to: Have better concentration and focus. Worry will interfere less in your daily thinking. Sleep better. Be less irritable. Have more  energy. Have improved memory. Try to recognize when your condition is getting worse. Contact your provider if your symptoms interfere with home or work and you feel like your condition is not improving. Follow these instructions at home: Activity Exercise. Adults should: Exercise for at least 150 minutes each week. The exercise should increase your heart rate and make you sweat (moderate-intensity exercise). Do strengthening exercises at least twice a week. Get the right amount and quality of sleep. Most adults need 7-9 hours of sleep each night. Lifestyle  Eat a healthy diet that includes plenty of vegetables, fruits, whole grains, low-fat dairy products, and lean protein. Do not eat a lot of foods that are high in fats, added sugars, or salt (sodium). Make choices that simplify your life. Do not use any products that contain nicotine or tobacco. These products include cigarettes, chewing tobacco, and vaping devices, such as e-cigarettes. If you need help quitting, ask your provider. Avoid caffeine, alcohol, and certain over-the-counter cold medicines. These may make you feel worse. Ask your pharmacist which medicines to avoid. General instructions Take over-the-counter and prescription medicines only as told by your provider. Keep all follow-up visits. This is to make sure you are managing your anxiety well or if you need more support. Where to find support You can get help and support from: Self-help groups. Online and community organizations. A trusted spiritual leader. Couples counseling. Family education classes. Family therapy. Where to find more information You may find that joining a support group helps you deal with your anxiety. The following sources can help you find counselors or support groups near you: Mental Health America: mentalhealthamerica.net Anxiety and Depression Association of America (ADAA): adaa.org National Alliance on Mental Illness (NAMI): nami.org Contact  a health care provider if: You have a hard time staying focused or finishing tasks. You spend many hours a day feeling worried about everyday life. You are very tired because you cannot stop worrying. You start to have headaches or often feel tense. You have chronic nausea or diarrhea. Get help right away if: Your heart feels like it is racing. You have shortness of breath. You have thoughts of hurting yourself or others. Get help right away if you feel like you may hurt yourself or others, or have thoughts about taking your own life. Go to your nearest emergency room or: Call 911. Call the National Suicide Prevention Lifeline at 1-800-273-8255 or 988. This is open 24 hours a day. Text the Crisis Text Line at 741741. This information is not intended to replace advice given to you by your health care provider. Make sure you discuss any questions you have with your health care provider. Document Revised: 06/06/2022 Document Reviewed: 12/19/2020 Elsevier Patient Education  2023 Elsevier Inc.  

## 2023-01-03 ENCOUNTER — Ambulatory Visit: Payer: Managed Care, Other (non HMO) | Admitting: Nurse Practitioner

## 2023-01-03 ENCOUNTER — Encounter: Payer: Self-pay | Admitting: Nurse Practitioner

## 2023-01-03 VITALS — BP 108/69 | HR 64 | Temp 98.4°F | Ht 62.99 in | Wt 151.3 lb

## 2023-01-03 DIAGNOSIS — K130 Diseases of lips: Secondary | ICD-10-CM | POA: Diagnosis not present

## 2023-01-03 NOTE — Progress Notes (Signed)
BP 108/69   Pulse 64   Temp 98.4 F (36.9 C) (Oral)   Ht 5' 2.99" (1.6 m)   Wt 151 lb 4.8 oz (68.6 kg)   SpO2 98%   BMI 26.81 kg/m    Subjective:    Patient ID: Karen Lambert, female    DOB: October 25, 1960, 62 y.o.   MRN: [id number]  HPI: Karen Lambert is a 62 y.o. female  Chief Complaint  Patient presents with   cracked lips   Mood   DRY LIPS Continues to have chafed lips. Lips getting really chapped and corners are cracking open.  Wears upper and lower partials.  Last B12 >2000 = had 12/06/22 and this helped until this morning flare presented.  Is on Metformin.  Tried Valtrex without benefit + Diflucan.  Using Cortibalm. Duration: weeks Location: lips  History of trauma in area: no Redness: yes Swelling: yes Oozing: no Pus: no Fevers: no Nausea/vomiting: no Status: fluctuating Treatments attempted: OTC chapstick, Abreva, Carmex  Relevant past medical, surgical, family and social history reviewed and updated as indicated. Interim medical history since our last visit reviewed. Allergies and medications reviewed and updated.  Review of Systems  Constitutional:  Negative for activity change, appetite change, diaphoresis, fatigue and fever.  Respiratory:  Negative for cough, chest tightness and shortness of breath.   Cardiovascular:  Negative for chest pain, palpitations and leg swelling.  Gastrointestinal: Negative.   Neurological: Negative.   Psychiatric/Behavioral:  Positive for decreased concentration and sleep disturbance. Negative for self-injury and suicidal ideas. The patient is nervous/anxious.     Per HPI unless specifically indicated above     Objective:    BP 108/69   Pulse 64   Temp 98.4 F (36.9 C) (Oral)   Ht 5' 2.99" (1.6 m)   Wt 151 lb 4.8 oz (68.6 kg)   SpO2 98%   BMI 26.81 kg/m   Wt Readings from Last 3 Encounters:  01/03/23 151 lb 4.8 oz (68.6 kg)  12/06/22 147 lb 1.6 oz (66.7 kg)  11/15/22 151 lb (68.5 kg)    Physical Exam Vitals and  nursing note reviewed.  Constitutional:      General: She is awake. She is not in acute distress.    Appearance: She is well-developed and well-groomed. She is not ill-appearing or toxic-appearing.  HENT:     Head: Normocephalic.     Right Ear: Hearing and external ear normal.     Left Ear: Hearing and external ear normal.     Mouth/Throat:     Lips: No lesions.     Mouth: Mucous membranes are moist. No injury, oral lesions or angioedema.     Comments: Lips upper and lower with moderate erythema and mild cracking to corners.  No lesions. Eyes:     General: Lids are normal.        Right eye: No discharge.        Left eye: No discharge.     Conjunctiva/sclera: Conjunctivae normal.     Pupils: Pupils are equal, round, and reactive to light.  Neck:     Thyroid: No thyromegaly.     Vascular: No carotid bruit or JVD.  Cardiovascular:     Rate and Rhythm: Normal rate and regular rhythm.     Heart sounds: Normal heart sounds. No murmur heard.    No gallop.  Pulmonary:     Effort: Pulmonary effort is normal.     Breath sounds: Normal breath sounds.  Abdominal:  General: Bowel sounds are normal.     Palpations: Abdomen is soft. There is no hepatomegaly or splenomegaly.  Musculoskeletal:     Cervical back: Normal range of motion and neck supple.     Right lower leg: No edema.     Left lower leg: No edema.  Lymphadenopathy:     Cervical: No cervical adenopathy.  Skin:    General: Skin is warm and dry.  Neurological:     Mental Status: She is alert and oriented to person, place, and time.  Psychiatric:        Attention and Perception: Attention normal.        Mood and Affect: Mood normal.        Behavior: Behavior normal. Behavior is cooperative.        Thought Content: Thought content normal.        Judgment: Judgment normal.     Results for orders placed or performed in visit on 12/06/22  Vitamin B12  Result Value Ref Range   Vitamin B-12 >2000 (H) 232 - 1245 pg/mL       Assessment & Plan:   Problem List Items Addressed This Visit       Other   Cracked lips - Primary    ?angular cheilitis.  History of lower normal B12 prior to Metformin use and injections have improved flares.  Oral B12 not offering much benefit, will continue B12 injections monthly.  Continue Cortibalm 3 times daily + zinc barrier cream at night to corners.  Return in 4 weeks.        Follow up plan: Return in about 1 month (around 02/05/2023) for T2DM, HTN/HLD, MOOD, B12 DEFICIENCY.

## 2023-01-03 NOTE — Assessment & Plan Note (Signed)
?  angular cheilitis.  History of lower normal B12 prior to Metformin use and injections have improved flares.  Oral B12 not offering much benefit, will continue B12 injections monthly.  Continue Cortibalm 3 times daily + zinc barrier cream at night to corners.  Return in 4 weeks.

## 2023-01-08 ENCOUNTER — Encounter: Payer: Self-pay | Admitting: Nurse Practitioner

## 2023-01-08 MED ORDER — OZEMPIC (0.25 OR 0.5 MG/DOSE) 2 MG/1.5ML ~~LOC~~ SOPN: 0.2500 mg | PEN_INJECTOR | SUBCUTANEOUS | 3 refills | Status: DC

## 2023-01-22 ENCOUNTER — Encounter: Payer: Self-pay | Admitting: Nurse Practitioner

## 2023-01-22 MED ORDER — GABAPENTIN 600 MG PO TABS: 300.0000 mg | ORAL_TABLET | Freq: Every day | ORAL | 2 refills | Status: DC

## 2023-02-04 DIAGNOSIS — D696 Thrombocytopenia, unspecified: Secondary | ICD-10-CM | POA: Insufficient documentation

## 2023-02-04 NOTE — Patient Instructions (Signed)
Be Involved in Your Health Care:  Taking Medications When medications are taken as directed, they can greatly improve your health. But if they are not taken as instructed, they may not work. In some cases, not taking them correctly can be harmful. To help ensure your treatment remains effective and safe, understand your medications and how to take them.  Your lab results, notes and after visit summary will be available on My Chart. We strongly encourage you to use this feature. If lab results are abnormal the clinic will contact you with the appropriate steps. If the clinic does not contact you assume the results are satisfactory. You can always see your results on My Chart. If you have questions regarding your condition, please contact the clinic during office hours. You can also ask questions on My Chart.  We at [facility] are grateful that you chose us to provide care. We strive to provide excellent and compassionate care and are always looking for feedback. If you get a survey from the clinic please complete this.   Diabetes Mellitus and Nutrition, Adult When you have diabetes, or diabetes mellitus, it is very important to have healthy eating habits because your blood sugar (glucose) levels are greatly affected by what you eat and drink. Eating healthy foods in the right amounts, at about the same times every day, can help you: Manage your blood glucose. Lower your risk of heart disease. Improve your blood pressure. Reach or maintain a healthy weight. What can affect my meal plan? Every person with diabetes is different, and each person has different needs for a meal plan. Your health care provider may recommend that you work with a dietitian to make a meal plan that is best for you. Your meal plan may vary depending on factors such as: The calories you need. The medicines you take. Your weight. Your blood glucose, blood pressure, and cholesterol levels. Your activity  level. Other health conditions you have, such as heart or kidney disease. How do carbohydrates affect me? Carbohydrates, also called carbs, affect your blood glucose level more than any other type of food. Eating carbs raises the amount of glucose in your blood. It is important to know how many carbs you can safely have in each meal. This is different for every person. Your dietitian can help you calculate how many carbs you should have at each meal and for each snack. How does alcohol affect me? Alcohol can cause a decrease in blood glucose (hypoglycemia), especially if you use insulin or take certain diabetes medicines by mouth. Hypoglycemia can be a life-threatening condition. Symptoms of hypoglycemia, such as sleepiness, dizziness, and confusion, are similar to symptoms of having too much alcohol. Do not drink alcohol if: Your health care provider tells you not to drink. You are pregnant, may be pregnant, or are planning to become pregnant. If you drink alcohol: Limit how much you have to: 0-1 drink a day for women. 0-2 drinks a day for men. Know how much alcohol is in your drink. In the [place] one drink equals one 12 oz bottle of beer (355 mL), one 5 oz glass of wine (148 mL), or one 1 oz glass of hard liquor (44 mL). Keep yourself hydrated with water, diet soda, or unsweetened iced tea. Keep in mind that regular soda, juice, and other mixers may contain a lot of sugar and must be counted as carbs. What are tips for following this plan?  Reading food labels Start by checking the serving   size on the Nutrition Facts label of packaged foods and drinks. The number of calories and the amount of carbs, fats, and other nutrients listed on the label are based on one serving of the item. Many items contain more than one serving per package. Check the total grams (g) of carbs in one serving. Check the number of grams of saturated fats and trans fats in one serving. Choose foods that have a low amount  or none of these fats. Check the number of milligrams (mg) of salt (sodium) in one serving. Most people should limit total sodium intake to less than 2,300 mg per day. Always check the nutrition information of foods labeled as "low-fat" or "nonfat." These foods may be higher in added sugar or refined carbs and should be avoided. Talk to your dietitian to identify your daily goals for nutrients listed on the label. Shopping Avoid buying canned, pre-made, or processed foods. These foods tend to be high in fat, sodium, and added sugar. Shop around the outside edge of the grocery store. This is where you will most often find fresh fruits and vegetables, bulk grains, fresh meats, and fresh dairy products. Cooking Use low-heat cooking methods, such as baking, instead of high-heat cooking methods, such as deep frying. Cook using healthy oils, such as olive, canola, or sunflower oil. Avoid cooking with butter, cream, or high-fat meats. Meal planning Eat meals and snacks regularly, preferably at the same times every day. Avoid going long periods of time without eating. Eat foods that are high in fiber, such as fresh fruits, vegetables, beans, and whole grains. Eat 4-6 oz (112-168 g) of lean protein each day, such as lean meat, chicken, fish, eggs, or tofu. One ounce (oz) (28 g) of lean protein is equal to: 1 oz (28 g) of meat, chicken, or fish. 1 egg.  cup (62 g) of tofu. Eat some foods each day that contain healthy fats, such as avocado, nuts, seeds, and fish. What foods should I eat? Fruits Berries. Apples. Oranges. Peaches. Apricots. Plums. Grapes. Mangoes. Papayas. Pomegranates. Kiwi. Cherries. Vegetables Leafy greens, including lettuce, spinach, kale, chard, collard greens, mustard greens, and cabbage. Beets. Cauliflower. Broccoli. Carrots. Green beans. Tomatoes. Peppers. Onions. Cucumbers. Brussels sprouts. Grains Whole grains, such as whole-wheat or whole-grain bread, crackers, tortillas,  cereal, and pasta. Unsweetened oatmeal. Quinoa. Brown or wild rice. Meats and other proteins Seafood. Poultry without skin. Lean cuts of poultry and beef. Tofu. Nuts. Seeds. Dairy Low-fat or fat-free dairy products such as milk, yogurt, and cheese. The items listed above may not be a complete list of foods and beverages you can eat and drink. Contact a dietitian for more information. What foods should I avoid? Fruits Fruits canned with syrup. Vegetables Canned vegetables. Frozen vegetables with butter or cream sauce. Grains Refined white flour and flour products such as bread, pasta, snack foods, and cereals. Avoid all processed foods. Meats and other proteins Fatty cuts of meat. Poultry with skin. Breaded or fried meats. Processed meat. Avoid saturated fats. Dairy Full-fat yogurt, cheese, or milk. Beverages Sweetened drinks, such as soda or iced tea. The items listed above may not be a complete list of foods and beverages you should avoid. Contact a dietitian for more information. Questions to ask a health care provider Do I need to meet with a certified diabetes care and education specialist? Do I need to meet with a dietitian? What number can I call if I have questions? When are the best times to check my blood glucose?   Where to find more information: American Diabetes Association: diabetes.org Academy of Nutrition and Dietetics: eatright.org National Institute of Diabetes and Digestive and Kidney Diseases: niddk.nih.gov Association of Diabetes Care & Education Specialists: diabeteseducator.org Summary It is important to have healthy eating habits because your blood sugar (glucose) levels are greatly affected by what you eat and drink. It is important to use alcohol carefully. A healthy meal plan will help you manage your blood glucose and lower your risk of heart disease. Your health care provider may recommend that you work with a dietitian to make a meal plan that is best for  you. This information is not intended to replace advice given to you by your health care provider. Make sure you discuss any questions you have with your health care provider. Document Revised: 03/31/2020 Document Reviewed: 03/31/2020 Elsevier Patient Education  2024 Elsevier Inc.  

## 2023-02-06 ENCOUNTER — Ambulatory Visit: Payer: Managed Care, Other (non HMO) | Admitting: Nurse Practitioner

## 2023-02-06 ENCOUNTER — Encounter: Payer: Self-pay | Admitting: Nurse Practitioner

## 2023-02-06 VITALS — BP 126/75 | HR 57 | Temp 97.4°F | Ht 62.99 in | Wt 149.6 lb

## 2023-02-06 DIAGNOSIS — E1165 Type 2 diabetes mellitus with hyperglycemia: Secondary | ICD-10-CM

## 2023-02-06 DIAGNOSIS — N951 Menopausal and female climacteric states: Secondary | ICD-10-CM

## 2023-02-06 DIAGNOSIS — E1169 Type 2 diabetes mellitus with other specified complication: Secondary | ICD-10-CM

## 2023-02-06 DIAGNOSIS — N393 Stress incontinence (female) (male): Secondary | ICD-10-CM

## 2023-02-06 DIAGNOSIS — Z7985 Long-term (current) use of injectable non-insulin antidiabetic drugs: Secondary | ICD-10-CM

## 2023-02-06 DIAGNOSIS — R238 Other skin changes: Secondary | ICD-10-CM

## 2023-02-06 DIAGNOSIS — E538 Deficiency of other specified B group vitamins: Secondary | ICD-10-CM

## 2023-02-06 DIAGNOSIS — E785 Hyperlipidemia, unspecified: Secondary | ICD-10-CM

## 2023-02-06 DIAGNOSIS — F411 Generalized anxiety disorder: Secondary | ICD-10-CM

## 2023-02-06 DIAGNOSIS — D696 Thrombocytopenia, unspecified: Secondary | ICD-10-CM

## 2023-02-06 LAB — BAYER DCA HB A1C WAIVED: HB A1C (BAYER DCA - WAIVED): 5.9 % — ABNORMAL HIGH (ref 4.8–5.6)

## 2023-02-06 MED ORDER — OXYBUTYNIN CHLORIDE ER 10 MG PO TB24: 10.0000 mg | ORAL_TABLET | Freq: Every day | ORAL | 4 refills | Status: DC

## 2023-02-06 MED ORDER — MONTELUKAST SODIUM 10 MG PO TABS: 10.0000 mg | ORAL_TABLET | Freq: Every day | ORAL | 4 refills | Status: DC

## 2023-02-06 MED ORDER — OMEGA-3-ACID ETHYL ESTERS 1 G PO CAPS: 2.0000 g | ORAL_CAPSULE | Freq: Two times a day (BID) | ORAL | 4 refills | Status: AC

## 2023-02-06 MED ORDER — OZEMPIC (0.25 OR 0.5 MG/DOSE) 2 MG/1.5ML ~~LOC~~ SOPN: 0.5000 mg | PEN_INJECTOR | SUBCUTANEOUS | 4 refills | Status: DC

## 2023-02-06 MED ORDER — ROSUVASTATIN CALCIUM 20 MG PO TABS: 20.0000 mg | ORAL_TABLET | Freq: Every day | ORAL | 4 refills | Status: DC

## 2023-02-06 NOTE — Assessment & Plan Note (Signed)
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  Denies SI/HI.  Continue Celexa 40 MG daily and Buspar to 10 MG BID.  Continue Trazodone PRN 25-50 MG at HS for short period for sleep needs. Discussed various methods of meditation and relaxation techniques at home.  Recommend she speak to [facility] about Respite Care and looking in hospice caregiver support group.

## 2023-02-06 NOTE — Progress Notes (Signed)
BP 126/75   Pulse (!) 57   Temp (!) 97.4 F (36.3 C) (Oral)   Ht 5' 2.99" (1.6 m)   Wt 149 lb 9.6 oz (67.9 kg)   SpO2 98%   BMI 26.51 kg/m    Subjective:    Patient ID: Karen Lambert, female    DOB: [birth year], 62 y.o.   MRN: [id number]  HPI: Karen Lambert is a 62 y.o. female  Chief Complaint  Patient presents with   Diabetes   Hypertension   Hyperlipidemia   Mood   B-12 check   Would like referral to dermatology for overall skin exam and color changes.    DIABETES Taking Ozempic 0.25 MG weekly, previously was on Metformin, but switched to Ozempic which is better tolerated by her -- stopped Metformin due to low levels B12.  Today is to start the 0.5 MG dosing.  Has been getting some lows on occasion, but does not consistently eat 3 meals a day.  Often eats 2 meals.  Last A1c February was 5.7%.   Hypoglycemic episodes:no Polydipsia/polyuria: no Visual disturbance: no Chest pain: no Paresthesias: no Glucose Monitoring: yes  Accucheck frequency: Daily  Fasting glucose: occasional lows 50-60 range -- on average 103-129  Post prandial:  Evening:  Before meals: Taking Insulin?: no  Long acting insulin:  Short acting insulin: Blood Pressure Monitoring: not checking Retinal Examination: Up To Date -- [facility] Foot Exam: Up to Date Pneumovax: Up To Date Influenza: Up to Date Aspirin: no   HYPERLIPIDEMIA No current ACE or ARB due to stable BP.  Continues on Rosuvastatin nad Omega 3. Hyperlipidemia status: good compliance Satisfied with current treatment?  yes Side effects:  no Medication compliance: good compliance Supplements: fish oil Aspirin:  no The ASCVD Risk score (Arnett DK, et al., 2019) failed to calculate for the following reasons:   The valid total cholesterol range is 130 to 320 mg/dL Chest pain:  no Coronary artery disease:  no Family history CAD:  yes Family history early CAD:  no   DEPRESSION Taking Celexa 40 MG daily + Buspar 10 MG BID -  caregiver to mother with dementia.  Continues Trazodone every night for sleep + Gabapentin for hot flashes.  Is taking B12 injections at home due to history of low levels with angular cheilitis presenting. Platelet count mildly low on recent labs. Mood status: ongoing due to caregiver status Satisfied with current treatment?: yes Symptom severity: moderate  Duration of current treatment : chronic Side effects: no Medication compliance: good compliance Psychotherapy/counseling: none Depressed mood: yes Anxious mood: yes Anhedonia: no Significant weight loss or gain: no Insomnia: yes hard to fall asleep Fatigue: yes Feelings of worthlessness or guilt: yes Impaired concentration/indecisiveness: yes Suicidal ideations: no -- she denies any thoughts or plans Hopelessness: yes Crying spells: no    02/06/2023    9:15 AM 01/03/2023   10:58 AM 12/06/2022    8:49 AM 11/02/2022    8:15 AM 08/08/2022    9:11 AM  Depression screen PHQ 2/9  Decreased Interest 3 2 2 2 3   Down, Depressed, Hopeless 2 1 2 2 2   PHQ - 2 Score 5 3 4 4 5   Altered sleeping 2 2 2 2 3   Tired, decreased energy 2 2 2 3 3   Change in appetite 2 2 2 2 3   Feeling bad or failure about yourself  2 3 2 2 3   Trouble concentrating 2 3 3 3 3   Moving slowly or fidgety/restless  1 0 0 0 2  Suicidal thoughts 1 0 0 1 1  PHQ-9 Score 17 15 15 17 23   Difficult doing work/chores Somewhat difficult Somewhat difficult Somewhat difficult Somewhat difficult        02/06/2023    9:15 AM 01/03/2023   10:58 AM 12/06/2022    8:49 AM 11/02/2022    8:15 AM  GAD 7 : Generalized Anxiety Score  Nervous, Anxious, on Edge 2 3 3 3   Control/stop worrying 2 3 3 3   Worry too much - different things 2 3 3 3   Trouble relaxing 2 2 3 3   Restless 2 2 2 1   Easily annoyed or irritable 2 2 3 3   Afraid - awful might happen 2 2 2 3   Total GAD 7 Score 14 17 19 19   Anxiety Difficulty Somewhat difficult Somewhat difficult Somewhat difficult Somewhat difficult    Relevant past medical, surgical, family and social history reviewed and updated as indicated. Interim medical history since our last visit reviewed. Allergies and medications reviewed and updated.  Review of Systems  Constitutional:  Negative for activity change, appetite change, diaphoresis, fatigue and fever.  Respiratory:  Negative for cough, chest tightness and shortness of breath.   Cardiovascular:  Negative for chest pain, palpitations and leg swelling.  Gastrointestinal: Negative.   Endocrine: Negative for polydipsia, polyphagia and polyuria.  Neurological: Negative.   Psychiatric/Behavioral:  Positive for sleep disturbance. Negative for decreased concentration, self-injury and suicidal ideas. The patient is nervous/anxious.     Per HPI unless specifically indicated above     Objective:    BP 126/75   Pulse (!) 57   Temp (!) 97.4 F (36.3 C) (Oral)   Ht 5' 2.99" (1.6 m)   Wt 149 lb 9.6 oz (67.9 kg)   SpO2 98%   BMI 26.51 kg/m   Wt Readings from Last 3 Encounters:  02/06/23 149 lb 9.6 oz (67.9 kg)  01/03/23 151 lb 4.8 oz (68.6 kg)  12/06/22 147 lb 1.6 oz (66.7 kg)    Physical Exam Vitals and nursing note reviewed.  Constitutional:      General: She is awake. She is not in acute distress.    Appearance: She is well-developed. She is not ill-appearing.  HENT:     Head: Normocephalic.     Right Ear: Hearing normal.     Left Ear: Hearing normal.  Eyes:     General: Lids are normal.        Right eye: No discharge.        Left eye: No discharge.     Conjunctiva/sclera: Conjunctivae normal.     Pupils: Pupils are equal, round, and reactive to light.  Neck:     Vascular: No carotid bruit.  Cardiovascular:     Rate and Rhythm: Normal rate and regular rhythm.     Heart sounds: Normal heart sounds. No murmur heard.    No gallop.  Pulmonary:     Effort: Pulmonary effort is normal. No accessory muscle usage or respiratory distress.     Breath sounds: Normal breath  sounds.  Abdominal:     General: Bowel sounds are normal.     Palpations: Abdomen is soft.  Musculoskeletal:     Cervical back: Normal range of motion and neck supple.     Right lower leg: No edema.     Left lower leg: No edema.  Skin:    General: Skin is warm and dry.  Neurological:     Mental Status:  She is alert and oriented to person, place, and time.  Psychiatric:        Attention and Perception: Attention normal.        Mood and Affect: Mood normal.        Behavior: Behavior normal. Behavior is cooperative.        Thought Content: Thought content normal.        Judgment: Judgment normal.    Results for orders placed or performed in visit on 12/06/22  Vitamin B12  Result Value Ref Range   Vitamin B-12 >2000 (H) 232 - 1245 pg/mL      Assessment & Plan:   Problem List Items Addressed This Visit       Cardiovascular and Mediastinum   Hot flashes due to menopause    Chronic, stable.  Improved with Celexa and Gabapentin.  Continue current regimen and adjust as needed.      Relevant Medications   omega-3 acid ethyl esters (LOVAZA) 1 g capsule   rosuvastatin (CRESTOR) 20 MG tablet     Endocrine   Hyperlipidemia associated with type 2 diabetes mellitus (HCC)    Chronic, ongoing.  Repeat lipid panel today and focus on diet.  Fasting labs.  Continue Lovaza and Crestor at this time.      Relevant Medications   omega-3 acid ethyl esters (LOVAZA) 1 g capsule   rosuvastatin (CRESTOR) 20 MG tablet   Semaglutide,0.25 or 0.5MG /DOS, (OZEMPIC, 0.25 OR 0.5 MG/DOSE,) 2 MG/1.5ML SOPN   Other Relevant Orders   Bayer DCA Hb A1c Waived   Comprehensive metabolic panel   Lipid Panel w/o Chol/HDL Ratio   Type 2 diabetes mellitus with hyperglycemia (HCC) - Primary    Diagnosed August 2023 with A1c 6.5%, A1c today 5.9%.  Urine ALB 150 February 2024 -- no current ACE or ARB, may benefit from this in future -- will discuss further next visit, trying to maintain minimal medication per  patient wishes.  Continues to lose weight with Ozempic and diet + exercise.  Is tolerating Ozempic, will continue this as is offering benefit to weight loss.  Continue to monitor blood sugar at home daily and document.  Continue focus on diabetic diet.   - Need ACE or ARB, is on statin - Vaccinations up to date. - Foot and eye exams up to date.      Relevant Medications   rosuvastatin (CRESTOR) 20 MG tablet   Semaglutide,0.25 or 0.5MG /DOS, (OZEMPIC, 0.25 OR 0.5 MG/DOSE,) 2 MG/1.5ML SOPN   Other Relevant Orders   Bayer DCA Hb A1c Waived     Hematopoietic and Hemostatic   Thrombocytopenia (HCC)    Noticed on past labs, mild.  Recheck today and monitor closely.      Relevant Orders   CBC with Differential/Platelet     Other   B12 deficiency    Chronic, improved.  Off Metformin and taking B12 injections with improvement in angular cheilitis -- continue this and check level today.      Relevant Orders   CBC with Differential/Platelet   Vitamin B12   Generalized anxiety disorder    Chronic, ongoing  Caregiver to her mother who has dementia.  Denies SI/HI.  Continue Celexa 40 MG daily and Buspar to 10 MG BID.  Continue Trazodone PRN 25-50 MG at HS for short period for sleep needs. Discussed various methods of meditation and relaxation techniques at home.  Recommend she speak to [facility] about Respite Care and looking in hospice caregiver support group.  Other Visit Diagnoses     Change of skin color       Referral to dermatology.   Relevant Orders   Ambulatory referral to Dermatology        Follow up plan: Return in about 6 months (around 08/09/2023) for T2DM, HLD, OVERACTIVE BLADDER, MOOD.

## 2023-02-06 NOTE — Assessment & Plan Note (Signed)
Chronic, improved.  Off Metformin and taking B12 injections with improvement in angular cheilitis -- continue this and check level today.

## 2023-02-06 NOTE — Assessment & Plan Note (Signed)
Chronic, stable.  Improved with Celexa and Gabapentin.  Continue current regimen and adjust as needed.

## 2023-02-06 NOTE — Assessment & Plan Note (Signed)
Chronic, ongoing.  Repeat lipid panel today and focus on diet.  Fasting labs.  Continue Lovaza and Crestor at this time. 

## 2023-02-06 NOTE — Assessment & Plan Note (Signed)
Noticed on past labs, mild.  Recheck today and monitor closely.

## 2023-02-06 NOTE — Assessment & Plan Note (Signed)
Diagnosed August 2023 with A1c 6.5%, A1c today 5.9%.  Urine ALB 150 February 2024 -- no current ACE or ARB, may benefit from this in future -- will discuss further next visit, trying to maintain minimal medication per patient wishes.  Continues to lose weight with Ozempic and diet + exercise.  Is tolerating Ozempic, will continue this as is offering benefit to weight loss.  Continue to monitor blood sugar at home daily and document.  Continue focus on diabetic diet.   - Need ACE or ARB, is on statin - Vaccinations up to date. - Foot and eye exams up to date.

## 2023-02-07 ENCOUNTER — Other Ambulatory Visit: Payer: Self-pay | Admitting: Nurse Practitioner

## 2023-02-07 LAB — CBC WITH DIFFERENTIAL/PLATELET
Basophils Absolute: 0.1 10*3/uL (ref 0.0–0.2)
Basos: 1 %
EOS (ABSOLUTE): 0.1 10*3/uL (ref 0.0–0.4)
Eos: 2 %
Hematocrit: 39.7 % (ref 34.0–46.6)
Hemoglobin: 13.5 g/dL (ref 11.1–15.9)
Immature Grans (Abs): 0 10*3/uL (ref 0.0–0.1)
Immature Granulocytes: 0 %
Lymphocytes Absolute: 1.6 10*3/uL (ref 0.7–3.1)
Lymphs: 32 %
MCH: 30.9 pg (ref 26.6–33.0)
MCHC: 34 g/dL (ref 31.5–35.7)
MCV: 91 fL (ref 79–97)
Monocytes Absolute: 0.4 10*3/uL (ref 0.1–0.9)
Monocytes: 9 %
Neutrophils Absolute: 2.8 10*3/uL (ref 1.4–7.0)
Neutrophils: 56 %
Platelets: 132 10*3/uL — ABNORMAL LOW (ref 150–450)
RBC: 4.37 x10E6/uL (ref 3.77–5.28)
RDW: 12.4 % (ref 11.7–15.4)
WBC: 4.9 10*3/uL (ref 3.4–10.8)

## 2023-02-07 LAB — COMPREHENSIVE METABOLIC PANEL
ALT: 13 IU/L (ref 0–32)
AST: 16 IU/L (ref 0–40)
Albumin/Globulin Ratio: 2.2 (ref 1.2–2.2)
Albumin: 4.7 g/dL (ref 3.9–4.9)
Alkaline Phosphatase: 111 IU/L (ref 44–121)
BUN/Creatinine Ratio: 14 (ref 12–28)
BUN: 14 mg/dL (ref 8–27)
Bilirubin Total: 0.3 mg/dL (ref 0.0–1.2)
CO2: 20 mmol/L (ref 20–29)
Calcium: 9.5 mg/dL (ref 8.7–10.3)
Chloride: 102 mmol/L (ref 96–106)
Creatinine, Ser: 1.01 mg/dL — ABNORMAL HIGH (ref 0.57–1.00)
Globulin, Total: 2.1 g/dL (ref 1.5–4.5)
Glucose: 100 mg/dL — ABNORMAL HIGH (ref 70–99)
Potassium: 4.2 mmol/L (ref 3.5–5.2)
Sodium: 142 mmol/L (ref 134–144)
Total Protein: 6.8 g/dL (ref 6.0–8.5)
eGFR: 63 mL/min/{1.73_m2} (ref >59)

## 2023-02-07 LAB — LIPID PANEL W/O CHOL/HDL RATIO
Cholesterol, Total: 114 mg/dL (ref 100–199)
HDL: 42 mg/dL (ref >39)
LDL Chol Calc (NIH): 41 mg/dL (ref 0–99)
Triglycerides: 190 mg/dL — ABNORMAL HIGH (ref 0–149)
VLDL Cholesterol Cal: 31 mg/dL (ref 5–40)

## 2023-02-07 LAB — VITAMIN B12: Vitamin B-12: >2000 pg/mL — ABNORMAL HIGH (ref 232–1245)

## 2023-02-07 MED ORDER — ROSUVASTATIN CALCIUM 40 MG PO TABS
40.0000 mg | ORAL_TABLET | Freq: Every day | ORAL | 4 refills | Status: DC
Start: 2023-02-07 — End: 2024-06-12

## 2023-02-07 NOTE — Progress Notes (Signed)
Contacted via MyChart   Good afternoon Karen Lambert, your labs have returned: - Kidney function, creatinine and eGFR, remains normal, as is liver function, AST and ALT.  - Cholesterol levels show LDL at goal, but triglycerides remain a bit elevated.  I am going to increase your Rosuvastatin to 40 MG daily, stop 20 MG dosing, we will recheck levels next visit.  Also focus heavily on healthy diet changes and regular exercise.  Any questions?  Keep being amazing!!  Thank you for allowing me to participate in your care.  I appreciate you. Kindest regards, Jayen Bromwell

## 2023-02-10 ENCOUNTER — Encounter: Payer: Self-pay | Admitting: Nurse Practitioner

## 2023-02-22 ENCOUNTER — Encounter: Payer: Self-pay | Admitting: Nurse Practitioner

## 2023-02-23 MED ORDER — CYANOCOBALAMIN 1000 MCG/ML IJ SOLN: 1000.0000 ug | INTRAMUSCULAR | 4 refills | Status: AC

## 2023-02-25 ENCOUNTER — Encounter: Payer: Self-pay | Admitting: Nurse Practitioner

## 2023-02-26 MED ORDER — CITALOPRAM HYDROBROMIDE 40 MG PO TABS: 40.0000 mg | ORAL_TABLET | Freq: Every day | ORAL | 4 refills | Status: DC

## 2023-03-12 ENCOUNTER — Encounter: Payer: Self-pay | Admitting: Nurse Practitioner

## 2023-03-12 ENCOUNTER — Ambulatory Visit: Payer: Self-pay | Admitting: *Deleted

## 2023-03-12 NOTE — Telephone Encounter (Signed)
Reason for Disposition  [1] MODERATE diarrhea (e.g., 4-6 times / day more than normal) AND [2] present > 48 hours (2 days)  Answer Assessment - Initial Assessment Questions 1. DIARRHEA SEVERITY: "How bad is the diarrhea?" "How many more stools have you had in the past 24 hours than normal?"    - NO DIARRHEA (SCALE 0)   - MILD (SCALE 1-3): Few loose or mushy BMs; increase of 1-3 stools over normal daily number of stools; mild increase in ostomy output.   -  MODERATE (SCALE 4-7): Increase of 4-6 stools daily over normal; moderate increase in ostomy output.   -  SEVERE (SCALE 8-10; OR "WORST POSSIBLE"): Increase of 7 or more stools daily over normal; moderate increase in ostomy output; incontinence.     I'm having abd pain and diarrhea 2. ONSET: "When did the diarrhea begin?"      Last Monday.    3. BM CONSISTENCY: "How loose or watery is the diarrhea?"      Watery   Every time I eat it goes straight through me.   I'm having gas and my stomach is rumbling.   The pain is from the top of my stomach to the lower abd.    I took [employer].   It helped a little.    4. VOMITING: "Are you also vomiting?" If Yes, ask: "How many times in the past 24 hours?"      No 5. ABDOMEN PAIN: "Are you having any abdomen pain?" If Yes, ask: "What does it feel like?" (e.g., crampy, dull, intermittent, constant)      Yes 6. ABDOMEN PAIN SEVERITY: If present, ask: "How bad is the pain?"  (e.g., Scale 1-10; mild, moderate, or severe)   - MILD (1-3): doesn't interfere with normal activities, abdomen soft and not tender to touch    - MODERATE (4-7): interferes with normal activities or awakens from sleep, abdomen tender to touch    - SEVERE (8-10): excruciating pain, doubled over, unable to do any normal activities       Moderate 7. ORAL INTAKE: If vomiting, "Have you been able to drink liquids?" "How much liquids have you had in the past 24 hours?"     Everything is starting to go through me 8. HYDRATION: "Any signs  of dehydration?" (e.g., dry mouth [not just dry lips], too weak to stand, dizziness, new weight loss) "When did you last urinate?"     No     9. EXPOSURE: "Have you traveled to a foreign country recently?" "Have you been exposed to anyone with diarrhea?" "Could you have eaten any food that was spoiled?"     No 10. ANTIBIOTIC USE: "Are you taking antibiotics now or have you taken antibiotics in the past 2 months?"       No 11. OTHER SYMPTOMS: "Do you have any other symptoms?" (e.g., fever, blood in stool)       No 12. PREGNANCY: "Is there any chance you are pregnant?" "When was your last menstrual period?"       Not asked  Protocols used: [facility]

## 2023-03-12 NOTE — Telephone Encounter (Signed)
  Chief Complaint: diarrhea and abd pain for a week Symptoms: watery diarrhea   Abd pain and gas from top of stomach to lower abd Frequency: For a week Pertinent Negatives: Patient denies vomiting or fever Disposition: [] ED /[] Urgent Care (no appt availability in office) / [x] Appointment(In office/virtual)/ []  [facility]/ [] Home Care/ [] Refused Recommended Disposition /[] [facility]/ []  Follow-up with PCP Additional Notes: Appt made with Erin Mecum, PA-C for 03/13/2023 at 3:00.

## 2023-03-13 ENCOUNTER — Ambulatory Visit: Payer: Managed Care, Other (non HMO) | Admitting: Physician Assistant

## 2023-03-13 ENCOUNTER — Encounter: Payer: Self-pay | Admitting: Physician Assistant

## 2023-03-13 VITALS — BP 94/63 | HR 56 | Wt 151.4 lb

## 2023-03-13 DIAGNOSIS — K529 Noninfective gastroenteritis and colitis, unspecified: Secondary | ICD-10-CM

## 2023-03-13 NOTE — Progress Notes (Signed)
Acute Office Visit   Patient: Karen Lambert   DOB: [birth year]   62 y.o. Female  MRN: [id number] Visit Date: 03/13/2023  Today's healthcare provider: Oswaldo Conroy Timesha Cervantez, PA-C  Introduced myself to the patient as a Secondary school teacher and provided education on APPs in clinical practice.    Chief Complaint  Patient presents with   Diarrhea   Abdominal Pain   Bloated    Patient says she is having noises and bubbling, bloated. Patient says she was constipated last week and was advised by Jolene to starting taking Benefiber gummies. Patient says now she is having diarrhea and has stopped the Benefiber. Patient says she is also having gas pains and indigestion. Patient has tried Pepto, Gas-X and Imodium.    Subjective    HPI HPI     Bloated    Additional comments: Patient says she is having noises and bubbling, bloated. Patient says she was constipated last week and was advised by Jolene to starting taking Benefiber gummies. Patient says now she is having diarrhea and has stopped the Benefiber. Patient says she is also having gas pains and indigestion. Patient has tried Pepto, Gas-X and Imodium.       Last edited by Malen Gauze, CMA on 03/13/2023  3:07 PM.       Abdominal discomfort, diarrhea, bloating  She reports all last week she had diarrhea and bloating  Prior to this she was constipated and was instructed to use fiber gummies by her PCP  She has stopped using the gummies now She reports she is feeling a bit better now but is having some gurgling today and has some heartburn She has been trying to eat a bland diet since yesterday  She reports several instances of incontinence with liquid stool while she is in bed and trying to rest   She states some of  her stools have been black since taking the Pepto   Interventions: Pepto, Imodium, Gas ex    Medications: Outpatient Medications Prior to Visit  Medication Sig   Blood Glucose Monitoring Suppl (ONETOUCH VERIO) w/Device KIT Use to  check blood sugar 3 times a day and document results, bring to appointments.  Goal is <130 fasting blood sugar and <180 two hours after meals.   busPIRone (BUSPAR) 10 MG tablet Take 1 tablet (10 mg total) by mouth 2 (two) times daily.   citalopram (CELEXA) 40 MG tablet Take 1 tablet (40 mg total) by mouth daily.   cyanocobalamin (VITAMIN B12) 1000 MCG/ML injection Inject 1 mL (1,000 mcg total) into the muscle every 30 (thirty) days.   gabapentin (NEURONTIN) 600 MG tablet Take 0.5 tablets (300 mg total) by mouth at bedtime.   glucose blood test strip Use to check blood sugar 3 times daily   ibuprofen (ADVIL) 600 MG tablet Take 1 tablet (600 mg total) by mouth every 8 (eight) hours as needed.   Lancets (ONETOUCH ULTRASOFT) lancets Use to check blood sugar 3 times a day and document results, bring to appointments.  Goal is <130 fasting blood sugar and <180 two hours after meals.   loratadine (CLARITIN) 10 MG tablet Take 10 mg by mouth daily.   MELATONIN PO Take by mouth daily. 3 mg at bed time   montelukast (SINGULAIR) 10 MG tablet Take 1 tablet (10 mg total) by mouth at bedtime.   omega-3 acid ethyl esters (LOVAZA) 1 g capsule Take 2 capsules (2 g total) by mouth 2 (two) times  daily.   oxybutynin (DITROPAN-XL) 10 MG 24 hr tablet Take 1 tablet (10 mg total) by mouth at bedtime.   rosuvastatin (CRESTOR) 40 MG tablet Take 1 tablet (40 mg total) by mouth daily.   Semaglutide,0.25 or 0.5MG /DOS, (OZEMPIC, 0.25 OR 0.5 MG/DOSE,) 2 MG/1.5ML SOPN Inject 0.5 mg into the skin once a week.   Syringe/Needle, Disp, (SYRINGE 3CC/20GX1-1/2") 20G X 1-1/2" 3 ML MISC Use to inject B12 shot monthly.   traZODone (DESYREL) 50 MG tablet TAKE 1/2 TO 1 TABLET(25 TO 50 MG) BY MOUTH AT BEDTIME AS NEEDED FOR SLEEP   VITAMIN D PO Take by mouth daily. 1000 mg   No facility-administered medications prior to visit.    Review of Systems  Constitutional:  Positive for fever (for one day last week but this has resolved). Negative  for chills.  Gastrointestinal:  Positive for abdominal distention, diarrhea and nausea. Negative for abdominal pain, blood in stool and vomiting.  Musculoskeletal:  Negative for myalgias.  Neurological:  Positive for headaches. Negative for dizziness and light-headedness.       Objective    BP 94/63   Pulse (!) 56   Wt 151 lb 6.4 oz (68.7 kg)   SpO2 97%   BMI 26.83 kg/m    Physical Exam Vitals reviewed.  Constitutional:      General: She is awake.     Appearance: Normal appearance. She is well-developed and well-groomed.  HENT:     Head: Normocephalic and atraumatic.  Pulmonary:     Effort: Pulmonary effort is normal.  Abdominal:     General: Abdomen is flat. Bowel sounds are increased. There is no distension.     Palpations: Abdomen is soft. There is no mass.     Tenderness: There is abdominal tenderness in the right lower quadrant, epigastric area, suprapubic area, left upper quadrant and left lower quadrant. There is no guarding or rebound.  Musculoskeletal:     Cervical back: Normal range of motion.  Neurological:     Mental Status: She is alert.  Psychiatric:        Behavior: Behavior is cooperative.       No results found for any visits on 03/13/23.  Assessment & Plan      No follow-ups on file.      Problem List Items Addressed This Visit   None Visit Diagnoses     Gastroenteritis    -  Primary Acute, new concern Patient reports bloating, abdominal discomfort, diarrhea for the last few days that is seemingly improving  We reviewed potential causes of her symptoms and for now I recommend conservative measures We discussed small portions of bland diet, increased hydration efforts and using OTC medications for symptom management We reviewed ED and return precautions  Follow up as needed for persistent or progressing symptoms          No follow-ups on file.   I, Mikeala Girdler E Dyanna Seiter, PA-C, have reviewed all documentation for this visit. The documentation  on 03/16/23 for the exam, diagnosis, procedures, and orders are all accurate and complete.   Jacquelin Hawking, MHS, PA-C [facility]

## 2023-03-19 ENCOUNTER — Encounter: Payer: Self-pay | Admitting: Nurse Practitioner

## 2023-03-21 NOTE — Telephone Encounter (Signed)
Please have patient schedule a follow-up appointment with me or her PCP to discuss this as it sounds like this might be a side effect of her Ozempic and a potential alternative may need to be discussed

## 2023-03-22 NOTE — Telephone Encounter (Signed)
Called and scheduled patient on 03/26/2023 @ 10:00 am.

## 2023-03-24 NOTE — Patient Instructions (Addendum)
Semaglutide Injection What is this medication? SEMAGLUTIDE (SEM a GLOO tide) treats type 2 diabetes. It works by increasing insulin levels in your body, which decreases your blood sugar (glucose). It also reduces the amount of sugar released into the blood and slows down your digestion. It can also be used to lower the risk of heart attack and stroke in people with type 2 diabetes. Changes to diet and exercise are often combined with this medication. This medicine may be used for other purposes; ask your health care provider or pharmacist if you have questions. COMMON BRAND NAME(S): OZEMPIC What should I tell my care team before I take this medication? They need to know if you have any of these conditions: Endocrine tumors (MEN 2) or if someone in your family had these tumors Eye disease, vision problems History of pancreatitis Kidney disease Stomach problems Thyroid cancer or if someone in your family had thyroid cancer An unusual or allergic reaction to semaglutide, other medications, foods, dyes, or preservatives Pregnant or trying to get pregnant Breast-feeding How should I use this medication? This medication is for injection under the skin of your upper leg (thigh), stomach area, or upper arm. It is given once every week (every 7 days). You will be taught how to prepare and give this medication. Use exactly as directed. Take your medication at regular intervals. Do not take it more often than directed. If you use this medication with insulin, you should inject this medication and the insulin separately. Do not mix them together. Do not give the injections right next to each other. Change (rotate) injection sites with each injection. It is important that you put your used needles and syringes in a special sharps container. Do not put them in a trash can. If you do not have a sharps container, call your pharmacist or care team to get one. A special MedGuide will be given to you by the  pharmacist with each prescription and refill. Be sure to read this information carefully each time. This medication comes with INSTRUCTIONS FOR USE. Ask your pharmacist for directions on how to use this medication. Read the information carefully. Talk to your pharmacist or care team if you have questions. Talk to your care team about the use of this medication in children. Special care may be needed. Overdosage: If you think you have taken too much of this medicine contact a poison control center or emergency room at once. NOTE: This medicine is only for you. Do not share this medicine with others. What if I miss a dose? If you miss a dose, take it as soon as you can within 5 days after the missed dose. Then take your next dose at your regular weekly time. If it has been longer than 5 days after the missed dose, do not take the missed dose. Take the next dose at your regular time. Do not take double or extra doses. If you have questions about a missed dose, contact your care team for advice. What may interact with this medication? Other medications for diabetes Many medications may cause changes in blood sugar, these include: Alcohol containing beverages Antiviral medications for HIV or AIDS Aspirin and aspirin-like medications Certain medications for blood pressure, heart disease, irregular heart beat Chromium Diuretics Female hormones, such as estrogens or progestins, birth control pills Fenofibrate Gemfibrozil Isoniazid Lanreotide Female hormones or anabolic steroids MAOIs like Carbex, Eldepryl, Marplan, Nardil, and Parnate Medications for weight loss Medications for allergies, asthma, cold, or cough Medications for depression,   anxiety, or psychotic disturbances Niacin Nicotine NSAIDs, medications for pain and inflammation, like ibuprofen or naproxen Octreotide Pasireotide Pentamidine Phenytoin Probenecid Quinolone antibiotics such as ciprofloxacin, levofloxacin, ofloxacin Some  herbal dietary supplements Steroid medications such as prednisone or cortisone Sulfamethoxazole; trimethoprim Thyroid hormones Some medications can hide the warning symptoms of low blood sugar (hypoglycemia). You may need to monitor your blood sugar more closely if you are taking one of these medications. These include: Beta-blockers, often used for high blood pressure or heart problems (examples include atenolol, metoprolol, propranolol) Clonidine Guanethidine Reserpine This list may not describe all possible interactions. Give your health care provider a list of all the medicines, herbs, non-prescription drugs, or dietary supplements you use. Also tell them if you smoke, drink alcohol, or use illegal drugs. Some items may interact with your medicine. What should I watch for while using this medication? Visit your care team for regular checks on your progress. Drink plenty of fluids while taking this medication. Check with your care team if you get an attack of severe diarrhea, nausea, and vomiting. The loss of too much body fluid can make it dangerous for you to take this medication. A test called the HbA1C (A1C) will be monitored. This is a simple blood test. It measures your blood sugar control over the last 2 to 3 months. You will receive this test every 3 to 6 months. Learn how to check your blood sugar. Learn the symptoms of low and high blood sugar and how to manage them. Always carry a quick-source of sugar with you in case you have symptoms of low blood sugar. Examples include hard sugar candy or glucose tablets. Make sure others know that you can choke if you eat or drink when you develop serious symptoms of low blood sugar, such as seizures or unconsciousness. They must get medical help at once. Tell your care team if you have high blood sugar. You might need to change the dose of your medication. If you are sick or exercising more than usual, you might need to change the dose of your  medication. Do not skip meals. Ask your care team if you should avoid alcohol. Many nonprescription cough and cold products contain sugar or alcohol. These can affect blood sugar. Pens should never be shared. Even if the needle is changed, sharing may result in passing of viruses like hepatitis or HIV. Wear a medical ID bracelet or chain, and carry a card that describes your disease and details of your medication and dosage times What side effects may I notice from receiving this medication? Side effects that you should report to your care team as soon as possible: Allergic reactions--skin rash, itching, hives, swelling of the face, lips, tongue, or throat Change in vision Dehydration--increased thirst, dry mouth, feeling faint or lightheaded, headache, dark yellow or brown urine Gallbladder problems--severe stomach pain, nausea, vomiting, fever Heart palpitations--rapid, pounding, or irregular heartbeat Kidney injury--decrease in the amount of urine, swelling of the ankles, hands, or feet Pancreatitis--severe stomach pain that spreads to your back or gets worse after eating or when touched, fever, nausea, vomiting Thoughts of suicide or self-harm, worsening mood, feelings of depression Thyroid cancer--new mass or lump in the neck, pain or trouble swallowing, trouble breathing, hoarseness Side effects that usually do not require medical attention (report these to your care team if they continue or are bothersome): Diarrhea Loss of appetite Nausea Upset stomach This list may not describe all possible side effects. Call your doctor for medical advice about   side effects. You may report side effects to FDA at 1-800-FDA-1088. Where should I keep my medication? Keep out of the reach of children. Store unopened pens in a refrigerator between 2 and 8 degrees C (36 and 46 degrees F). Do not freeze. Protect from light and heat. After you first use the pen, it can be stored for 56 days at room  temperature between 15 and 30 degrees C (59 and 86 degrees F) or in a refrigerator. Throw away your used pen after 56 days or after the expiration date, whichever comes first. Do not store your pen with the needle attached. If the needle is left on, medication may leak from the pen. NOTE: This sheet is a summary. It may not cover all possible information. If you have questions about this medicine, talk to your doctor, pharmacist, or health care provider.  2024 Elsevier/Gold Standard (2022-11-05 00:00:00)  

## 2023-03-26 ENCOUNTER — Ambulatory Visit: Payer: Managed Care, Other (non HMO) | Admitting: Nurse Practitioner

## 2023-03-26 ENCOUNTER — Encounter: Payer: Self-pay | Admitting: Nurse Practitioner

## 2023-03-26 VITALS — BP 98/64 | HR 64 | Temp 97.9°F | Ht 62.99 in | Wt 148.6 lb

## 2023-03-26 DIAGNOSIS — E1165 Type 2 diabetes mellitus with hyperglycemia: Secondary | ICD-10-CM | POA: Diagnosis not present

## 2023-03-26 MED ORDER — DAPAGLIFLOZIN PROPANEDIOL 5 MG PO TABS
5.0000 mg | ORAL_TABLET | Freq: Every day | ORAL | 5 refills | Status: DC
Start: 2023-03-26 — End: 2023-05-09

## 2023-03-26 NOTE — Assessment & Plan Note (Signed)
Diagnosed August 2023 with A1c 6.5%, A1c in May 5.9%.  Urine ALB 150 February 2024 -- no current ACE or ARB, may benefit from this in future -- will discuss further next visit, trying to maintain minimal medication per patient wishes.  Not tolerating Ozempic, major GI issues, and did not tolerate Metformin.  Discussed with her options, including stopping medication and focusing on diet/exercise only OR a trial of low dose SGLT2.  She would like to try the medication, sent in Farxiga 5 MG to start.  Educated her on this and side effects to inform provider of + need to ensure increased hydration.  Continue to monitor blood sugar at home daily and document.  Continue focus on diabetic diet.   - Need ACE or ARB, is on statin - Vaccinations up to date. - Foot and eye exams up to date.

## 2023-03-26 NOTE — Progress Notes (Signed)
BP 98/64   Pulse 64   Temp 97.9 F (36.6 C) (Oral)   Ht 5' 2.99" (1.6 m)   Wt 148 lb 9.6 oz (67.4 kg)   SpO2 97%   BMI 26.33 kg/m    Subjective:    Patient ID: Karen Lambert, female    DOB: 1960/12/15, 62 y.o.   MRN: [id number]  HPI: Karen Lambert is a 62 y.o. female  Chief Complaint  Patient presents with   Diarrhea   Stomach issues    Having diarrhea, gas and some constipation, started after starting Ozempic   DIABETES Taking Ozempic 0.5 MG for diabetes.  Last A1c 5.9% May.  Initially diagnosed August 2023 with A1c 6.5%.  She is having side effects with Ozempic, started with constipation -- took Metamucil but then this caused diarrhea.  Stopped Metamucil and still having diarrhea every day.  Not long after eating gas starts and she runs to bathroom.  Took Metformin in past but this caused mouth issues and B12 deficiency. Hypoglycemic episodes:no Polydipsia/polyuria: no Visual disturbance: no Chest pain: no Paresthesias: no Glucose Monitoring: yes  Accucheck frequency: Daily  Fasting glucose: on average 90 to 120  Post prandial:  Evening:  Before meals: Taking Insulin?: no  Long acting insulin:  Short acting insulin: Blood Pressure Monitoring: daily Retinal Examination: Up to Date Foot Exam: Up to Date Pneumovax: Not up to Date Influenza: Up to Date Aspirin: no   Relevant past medical, surgical, family and social history reviewed and updated as indicated. Interim medical history since our last visit reviewed. Allergies and medications reviewed and updated.  Review of Systems  Constitutional:  Negative for activity change, appetite change, diaphoresis, fatigue and fever.  Respiratory:  Negative for cough, chest tightness and shortness of breath.   Cardiovascular:  Negative for chest pain, palpitations and leg swelling.  Gastrointestinal:  Positive for abdominal distention, constipation, diarrhea and nausea. Negative for abdominal pain and vomiting.  Endocrine:  Negative for polydipsia, polyphagia and polyuria.  Neurological: Negative.   Psychiatric/Behavioral: Negative.      Per HPI unless specifically indicated above     Objective:    BP 98/64   Pulse 64   Temp 97.9 F (36.6 C) (Oral)   Ht 5' 2.99" (1.6 m)   Wt 148 lb 9.6 oz (67.4 kg)   SpO2 97%   BMI 26.33 kg/m   Wt Readings from Last 3 Encounters:  03/26/23 148 lb 9.6 oz (67.4 kg)  03/13/23 151 lb 6.4 oz (68.7 kg)  02/06/23 149 lb 9.6 oz (67.9 kg)    Physical Exam Vitals and nursing note reviewed.  Constitutional:      General: She is awake. She is not in acute distress.    Appearance: She is well-developed. She is not ill-appearing.  HENT:     Head: Normocephalic.     Right Ear: Hearing normal.     Left Ear: Hearing normal.  Eyes:     General: Lids are normal.        Right eye: No discharge.        Left eye: No discharge.     Conjunctiva/sclera: Conjunctivae normal.     Pupils: Pupils are equal, round, and reactive to light.  Neck:     Vascular: No carotid bruit.  Cardiovascular:     Rate and Rhythm: Normal rate and regular rhythm.     Heart sounds: Normal heart sounds. No murmur heard.    No gallop.  Pulmonary:  Effort: Pulmonary effort is normal. No accessory muscle usage or respiratory distress.     Breath sounds: Normal breath sounds.  Abdominal:     General: Bowel sounds are normal. There is no distension.     Palpations: Abdomen is soft.     Tenderness: There is no abdominal tenderness.  Musculoskeletal:     Cervical back: Normal range of motion and neck supple.     Right lower leg: No edema.     Left lower leg: No edema.  Skin:    General: Skin is warm and dry.  Neurological:     Mental Status: She is alert and oriented to person, place, and time.  Psychiatric:        Attention and Perception: Attention normal.        Mood and Affect: Mood normal.        Behavior: Behavior normal. Behavior is cooperative.        Thought Content: Thought content  normal.        Judgment: Judgment normal.     Results for orders placed or performed in visit on 02/06/23  Bayer DCA Hb A1c Waived  Result Value Ref Range   HB A1C (BAYER DCA - WAIVED) 5.9 (H) 4.8 - 5.6 %  Comprehensive metabolic panel  Result Value Ref Range   Glucose 100 (H) 70 - 99 mg/dL   BUN 14 8 - 27 mg/dL   Creatinine, Ser 5.62 (H) 0.57 - 1.00 mg/dL   eGFR 63 >13 YQ/MVH/8.46   BUN/Creatinine Ratio 14 12 - 28   Sodium 142 134 - 144 mmol/L   Potassium 4.2 3.5 - 5.2 mmol/L   Chloride 102 96 - 106 mmol/L   CO2 20 20 - 29 mmol/L   Calcium 9.5 8.7 - 10.3 mg/dL   Total Protein 6.8 6.0 - 8.5 g/dL   Albumin 4.7 3.9 - 4.9 g/dL   Globulin, Total 2.1 1.5 - 4.5 g/dL   Albumin/Globulin Ratio 2.2 1.2 - 2.2   Bilirubin Total 0.3 0.0 - 1.2 mg/dL   Alkaline Phosphatase 111 44 - 121 IU/L   AST 16 0 - 40 IU/L   ALT 13 0 - 32 IU/L  Lipid Panel w/o Chol/HDL Ratio  Result Value Ref Range   Cholesterol, Total 114 100 - 199 mg/dL   Triglycerides 962 (H) 0 - 149 mg/dL   HDL 42 >95 mg/dL   VLDL Cholesterol Cal 31 5 - 40 mg/dL   LDL Chol Calc (NIH) 41 0 - 99 mg/dL  CBC with Differential/Platelet  Result Value Ref Range   WBC 4.9 3.4 - 10.8 x10E3/uL   RBC 4.37 3.77 - 5.28 x10E6/uL   Hemoglobin 13.5 11.1 - 15.9 g/dL   Hematocrit 28.4 13.2 - 46.6 %   MCV 91 79 - 97 fL   MCH 30.9 26.6 - 33.0 pg   MCHC 34.0 31.5 - 35.7 g/dL   RDW 44.0 10.2 - 72.5 %   Platelets 132 (L) 150 - 450 x10E3/uL   Neutrophils 56 Not Estab. %   Lymphs 32 Not Estab. %   Monocytes 9 Not Estab. %   Eos 2 Not Estab. %   Basos 1 Not Estab. %   Neutrophils Absolute 2.8 1.4 - 7.0 x10E3/uL   Lymphocytes Absolute 1.6 0.7 - 3.1 x10E3/uL   Monocytes Absolute 0.4 0.1 - 0.9 x10E3/uL   EOS (ABSOLUTE) 0.1 0.0 - 0.4 x10E3/uL   Basophils Absolute 0.1 0.0 - 0.2 x10E3/uL   Immature Granulocytes 0 Not Estab. %  Immature Grans (Abs) 0.0 0.0 - 0.1 x10E3/uL  Vitamin B12  Result Value Ref Range   Vitamin B-12 >2000 (H) 232 - 1245  pg/mL      Assessment & Plan:   Problem List Items Addressed This Visit       Endocrine   Type 2 diabetes mellitus with hyperglycemia (HCC) - Primary    Diagnosed August 2023 with A1c 6.5%, A1c in May 5.9%.  Urine ALB 150 February 2024 -- no current ACE or ARB, may benefit from this in future -- will discuss further next visit, trying to maintain minimal medication per patient wishes.  Not tolerating Ozempic, major GI issues, and did not tolerate Metformin.  Discussed with her options, including stopping medication and focusing on diet/exercise only OR a trial of low dose SGLT2.  She would like to try the medication, sent in Farxiga 5 MG to start.  Educated her on this and side effects to inform provider of + need to ensure increased hydration.  Continue to monitor blood sugar at home daily and document.  Continue focus on diabetic diet.   - Need ACE or ARB, is on statin - Vaccinations up to date. - Foot and eye exams up to date.      Relevant Medications   dapagliflozin propanediol (FARXIGA) 5 MG TABS tablet     Follow up plan: Return in about 6 weeks (around 05/07/2023) for T2DM -- added on Farxiga.

## 2023-05-05 NOTE — Patient Instructions (Incomplete)
Local Dementia Caregiver support:  [facility] [address] [place], [place] [address] Phone: [phone] Fax: [phone] [email or web address]  [facility] Dementia Family Support Program [address] [place], [place] [address] [phone] [email or web address]  Caregiver's Support Group [facility]  2nd Wednesday of the Month Time: 10:00 am - 11:30 am  [address] [place], [place] [address] [phone] [email or web address]    Be Involved in Caring For Your Health:  Taking Medications When medications are taken as directed, they can greatly improve your health. But if they are not taken as prescribed, they may not work. In some cases, not taking them correctly can be harmful. To help ensure your treatment remains effective and safe, understand your medications and how to take them. Bring your medications to each visit for review by your provider.  Your lab results, notes, and after visit summary will be available on My Chart. We strongly encourage you to use this feature. If lab results are abnormal the clinic will contact you with the appropriate steps. If the clinic does not contact you assume the results are satisfactory. You can always view your results on My Chart. If you have questions regarding your health or results, please contact the clinic during office hours. You can also ask questions on My Chart.  We at [facility] are grateful that you chose [place] to provide your care. We strive to provide evidence-based and compassionate care and are always looking for feedback. If you get a survey from the clinic please complete this so we can hear your opinions.  Diabetes Mellitus Basics  Diabetes mellitus, or diabetes, is a long-term (chronic) disease. It occurs when the body does not properly use sugar (glucose) that is released from food after you eat. Diabetes mellitus may be caused by one or  both of these problems: Your pancreas does not make enough of a hormone called insulin. Your body does not react in a normal way to the insulin that it makes. Insulin lets glucose enter cells in your body. This gives you energy. If you have diabetes, glucose cannot get into cells. This causes high blood glucose (hyperglycemia). How to treat and manage diabetes You may need to take insulin or other diabetes medicines daily to keep your glucose in balance. If you are prescribed insulin, you will learn how to give yourself insulin by injection. You may need to adjust the amount of insulin you take based on the foods that you eat. You will need to check your blood glucose levels using a glucose monitor as told by your health care provider. The readings can help determine if you have low or high blood glucose. Generally, you should have these blood glucose levels: Before meals (preprandial): 80-130 mg/dL (6.4-3.3 mmol/L). After meals (postprandial): below 180 mg/dL (10 mmol/L). Hemoglobin A1c (HbA1c) level: less than 7%. Your health care provider will set treatment goals for you. Keep all follow-up visits. This is important. Follow these instructions at home: Diabetes medicines Take your diabetes medicines every day as told by your health care provider. List your diabetes medicines here: Name of medicine: ______________________________ Amount (dose): _______________ Time (a.m./p.m.): _______________ Notes: ___________________________________ Name of medicine: ______________________________ Amount (dose): _______________ Time (a.m./p.m.): _______________ Notes: ___________________________________ Name of medicine: ______________________________ Amount (dose): _______________ Time (a.m./p.m.): _______________ Notes: ___________________________________ Insulin If you use insulin, list the types of insulin you use here: Insulin type: ______________________________ Amount (dose): _______________ Time  (a.m./p.m.): _______________Notes: ___________________________________ Insulin type: ______________________________ Amount (  dose): _______________ Time (a.m./p.m.): _______________ Notes: ___________________________________ Insulin type: ______________________________ Amount (dose): _______________ Time (a.m./p.m.): _______________ Notes: ___________________________________ Insulin type: ______________________________ Amount (dose): _______________ Time (a.m./p.m.): _______________ Notes: ___________________________________ Insulin type: ______________________________ Amount (dose): _______________ Time (a.m./p.m.): _______________ Notes: ___________________________________ Managing blood glucose  Check your blood glucose levels using a glucose monitor as told by your health care provider. Write down the times that you check your glucose levels here: Time: _______________ Notes: ___________________________________ Time: _______________ Notes: ___________________________________ Time: _______________ Notes: ___________________________________ Time: _______________ Notes: ___________________________________ Time: _______________ Notes: ___________________________________ Time: _______________ Notes: ___________________________________  Low blood glucose Low blood glucose (hypoglycemia) is when glucose is at or below 70 mg/dL (3.9 mmol/L). Symptoms may include: Feeling: Hungry. Sweaty and clammy. Irritable or easily upset. Dizzy. Sleepy. Having: A fast heartbeat. A headache. A change in your vision. Numbness around the mouth, lips, or tongue. Having trouble with: Moving (coordination). Sleeping. Treating low blood glucose To treat low blood glucose, eat or drink something containing sugar right away. If you can think clearly and swallow safely, follow the 15:15 rule: Take 15 grams of a fast-acting carb (carbohydrate), as told by your health care provider. Some fast-acting carbs  are: Glucose tablets: take 3-4 tablets. Hard candy: eat 3-5 pieces. Fruit juice: drink 4 oz (120 mL). Regular (not diet) soda: drink 4-6 oz (120-180 mL). Honey or sugar: eat 1 Tbsp (15 mL). Check your blood glucose levels 15 minutes after you take the carb. If your glucose is still at or below 70 mg/dL (3.9 mmol/L), take 15 grams of a carb again. If your glucose does not go above 70 mg/dL (3.9 mmol/L) after 3 tries, get help right away. After your glucose goes back to normal, eat a meal or a snack within 1 hour. Treating very low blood glucose If your glucose is at or below 54 mg/dL (3 mmol/L), you have very low blood glucose (severe hypoglycemia). This is an emergency. Do not wait to see if the symptoms will go away. Get medical help right away. Call your local emergency services (911 in the [place] Do not drive yourself to the hospital. Questions to ask your health care provider Should I talk with a diabetes educator? What equipment will I need to care for myself at home? What diabetes medicines do I need? When should I take them? How often do I need to check my blood glucose levels? What number can I call if I have questions? When is my follow-up visit? Where can I find a support group for people with diabetes? Where to find more information American Diabetes Association: www.diabetes.org Association of Diabetes Care and Education Specialists: www.diabeteseducator.org Contact a health care provider if: Your blood glucose is at or above 240 mg/dL (16.1 mmol/L) for 2 days in a row. You have been sick or have had a fever for 2 days or more, and you are not getting better. You have any of these problems for more than 6 hours: You cannot eat or drink. You feel nauseous. You vomit. You have diarrhea. Get help right away if: Your blood glucose is lower than 54 mg/dL (3 mmol/L). You get confused. You have trouble thinking clearly. You have trouble breathing. These symptoms may  represent a serious problem that is an emergency. Do not wait to see if the symptoms will go away. Get medical help right away. Call your local emergency services (911 in the [place] Do not drive yourself to the hospital. Summary Diabetes mellitus is a chronic disease that occurs when the body does  not properly use sugar (glucose) that is released from food after you eat. Take insulin and diabetes medicines as told. Check your blood glucose every day, as often as told. Keep all follow-up visits. This is important. This information is not intended to replace advice given to you by your health care provider. Make sure you discuss any questions you have with your health care provider. Document Revised: 12/30/2019 Document Reviewed: 12/30/2019 Elsevier Patient Education  2024 [employer].

## 2023-05-07 ENCOUNTER — Encounter: Payer: Self-pay | Admitting: Nurse Practitioner

## 2023-05-08 ENCOUNTER — Encounter: Payer: Self-pay | Admitting: Otolaryngology

## 2023-05-08 ENCOUNTER — Encounter: Payer: Self-pay | Admitting: Nurse Practitioner

## 2023-05-08 ENCOUNTER — Ambulatory Visit: Payer: Managed Care, Other (non HMO) | Admitting: Nurse Practitioner

## 2023-05-08 VITALS — BP 108/65 | HR 58 | Temp 97.8°F | Wt 153.0 lb

## 2023-05-08 DIAGNOSIS — E1169 Type 2 diabetes mellitus with other specified complication: Secondary | ICD-10-CM | POA: Diagnosis not present

## 2023-05-08 DIAGNOSIS — D696 Thrombocytopenia, unspecified: Secondary | ICD-10-CM | POA: Diagnosis not present

## 2023-05-08 DIAGNOSIS — F411 Generalized anxiety disorder: Secondary | ICD-10-CM

## 2023-05-08 DIAGNOSIS — E538 Deficiency of other specified B group vitamins: Secondary | ICD-10-CM

## 2023-05-08 DIAGNOSIS — E1165 Type 2 diabetes mellitus with hyperglycemia: Secondary | ICD-10-CM | POA: Diagnosis not present

## 2023-05-08 DIAGNOSIS — Z7984 Long term (current) use of oral hypoglycemic drugs: Secondary | ICD-10-CM

## 2023-05-08 DIAGNOSIS — N951 Menopausal and female climacteric states: Secondary | ICD-10-CM

## 2023-05-08 DIAGNOSIS — K130 Diseases of lips: Secondary | ICD-10-CM

## 2023-05-08 DIAGNOSIS — E785 Hyperlipidemia, unspecified: Secondary | ICD-10-CM

## 2023-05-08 LAB — BAYER DCA HB A1C WAIVED: HB A1C (BAYER DCA - WAIVED): 6.2 % — ABNORMAL HIGH (ref 4.8–5.6)

## 2023-05-08 MED ORDER — FLUCONAZOLE 150 MG PO TABS: 150.0000 mg | ORAL_TABLET | Freq: Once | ORAL | 0 refills | Status: DC

## 2023-05-08 MED ORDER — MICONAZOLE NITRATE 2 % EX CREA: 1.0000 | TOPICAL_CREAM | Freq: Two times a day (BID) | CUTANEOUS | 4 refills | Status: DC

## 2023-05-08 MED ORDER — MUPIROCIN 2 % EX OINT: 1.0000 | TOPICAL_OINTMENT | Freq: Two times a day (BID) | CUTANEOUS | 2 refills | Status: DC

## 2023-05-08 NOTE — Assessment & Plan Note (Signed)
Chronic, ongoing.  Repeat lipid panel today and focus on diet.  Continue Lovaza and Crestor at this time.

## 2023-05-08 NOTE — Assessment & Plan Note (Signed)
Chronic, improved.  Off Metformin and taking B12 injections with improvement in angular cheilitis flares -- continue this and check level next visit.

## 2023-05-08 NOTE — Assessment & Plan Note (Signed)
Chronic, stable.  Improved with Celexa and Gabapentin.  Continue current regimen and adjust as needed.

## 2023-05-08 NOTE — Telephone Encounter (Signed)
Will discuss with patient at visit today

## 2023-05-08 NOTE — Assessment & Plan Note (Signed)
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  Denies SI/HI.  Continue Celexa 40 MG daily and Buspar to 10 MG BID.  Continue Trazodone PRN 25-50 MG at HS for short period for sleep needs. Discussed various methods of meditation and relaxation techniques at home.  Recommend she speak to [facility] about Respite Care and looking in hospice caregiver support group.

## 2023-05-08 NOTE — Anesthesia Preprocedure Evaluation (Addendum)
Anesthesia Evaluation  Patient identified by MRN, date of birth, ID band Patient awake    Reviewed: Allergy & Precautions, H&P , NPO status , Patient's Chart, lab work & pertinent test results  Airway Mallampati: III  TM Distance: <3 FB Neck ROM: Full  Mouth opening: Limited Mouth Opening Comment: Sleep apnea with CPAP, likely difficult airway, would suggest availability of videolaryngoscope Dental  (+) Edentulous Upper, Edentulous Lower Patient removed her dentures:   Pulmonary sleep apnea , former smoker   Pulmonary exam normal breath sounds clear to auscultation       Cardiovascular negative cardio ROS Normal cardiovascular exam Rhythm:Regular Rate:Normal     Neuro/Psych  PSYCHIATRIC DISORDERS Anxiety     negative neurological ROS     GI/Hepatic Neg liver ROS,GERD  ,,  Endo/Other  diabetes    Renal/GU negative Renal ROS  negative genitourinary   Musculoskeletal negative musculoskeletal ROS (+)    Abdominal   Peds negative pediatric ROS (+)  Hematology negative hematology ROS (+)   Anesthesia Other Findings Hyperlipidemia  Anxiety Generalized anxiety disorder Skin cancer  Broken ankle Overactive bladder  Type 2 diabetes mellitus (HCC) Sleep apnea  Wears dentures Vertigo     Reproductive/Obstetrics negative OB ROS                             Anesthesia Physical Anesthesia Plan  ASA: 2  Anesthesia Plan: General ETT   Post-op Pain Management:    Induction: Intravenous  PONV Risk Score and Plan:   Airway Management Planned: Oral ETT  Additional Equipment:   Intra-op Plan:   Post-operative Plan: Extubation in OR  Informed Consent: I have reviewed the patients History and Physical, chart, labs and discussed the procedure including the risks, benefits and alternatives for the proposed anesthesia with the patient or authorized representative who has indicated his/her  understanding and acceptance.     Dental Advisory Given  Plan Discussed with: Anesthesiologist, CRNA and Surgeon  Anesthesia Plan Comments: (Patient consented for risks of anesthesia including but not limited to:  - adverse reactions to medications - damage to eyes, teeth, lips or other oral mucosa - nerve damage due to positioning  - sore throat or hoarseness - Damage to heart, brain, nerves, lungs, other parts of body or loss of life  Patient voiced understanding.)       Anesthesia Quick Evaluation

## 2023-05-08 NOTE — Progress Notes (Signed)
BP 108/65   Pulse (!) 58   Temp 97.8 F (36.6 C)   Wt 153 lb (69.4 kg)   SpO2 98%   BMI 27.11 kg/m    Subjective:    Patient ID: Karen Lambert, female    DOB: [birth year], 62 y.o.   MRN: [id number]  HPI: Karen Lambert is a 62 y.o. female  Chief Complaint  Patient presents with   Diabetes    Marcelline Deist breaking lips out; pt is fasting   DIABETES Currently taking Farxiga 5 MG daily.  Last A1c was 5.9% in May.  Has tried Metformin which caused issues with angular cheilitis.  Ozempic was taken but this caused too many side effects. Is taking B12 injections at home due to history of low levels with angular cheilitis presenting. Platelet count mildly low on recent labs -- 132 and 144.  On Friday she started to feel dryness to lips and and redness appeared again.  She is using ointment, which helps when it stays on. Hypoglycemic episodes:no Polydipsia/polyuria: no Visual disturbance: no Chest pain: no Paresthesias: no Glucose Monitoring: yes  Accucheck frequency: Daily  Fasting glucose: 91 to 213 varying on time of day  Post prandial:  Evening:  Before meals: Taking Insulin?: no  Long acting insulin:  Short acting insulin: Blood Pressure Monitoring: not checking Retinal Examination: Up To Date -- [facility] Foot Exam: Up to Date Pneumovax: Up To Date Influenza: Up to Date Aspirin: no   HYPERLIPIDEMIA No current ACE or ARB due to stable BP.  Continues on Rosuvastatin and Omega 3. Hyperlipidemia status: good compliance Satisfied with current treatment?  yes Side effects:  no Medication compliance: good compliance Supplements: fish oil Aspirin:  no The ASCVD Risk score (Arnett DK, et al., 2019) failed to calculate for the following reasons:   The valid total cholesterol range is 130 to 320 mg/dL Chest pain:  no Coronary artery disease:  no Family history CAD:  yes Family history early CAD:  no   DEPRESSION Taking Celexa 40 MG daily + Buspar 10 MG BID - caregiver to  mother with dementia.  Taking Trazodone every night for sleep + Gabapentin for hot flashes.   Mood status: ongoing due to caregiver status Satisfied with current treatment?: yes Symptom severity: moderate  Duration of current treatment : chronic Side effects: no Medication compliance: good compliance Psychotherapy/counseling: none Depressed mood: yes Anxious mood: yes Anhedonia: no Significant weight loss or gain: no Insomnia: yes hard to fall asleep Fatigue: yes Feelings of worthlessness or guilt: yes Impaired concentration/indecisiveness: yes Suicidal ideations: no  Hopelessness: yes Crying spells: no    05/08/2023    9:38 AM 03/13/2023    3:09 PM 02/06/2023    9:15 AM 01/03/2023   10:58 AM 12/06/2022    8:49 AM  Depression screen PHQ 2/9  Decreased Interest 2 2 3 2 2   Down, Depressed, Hopeless 2 1 2 1 2   PHQ - 2 Score 4 3 5 3 4   Altered sleeping 2 3 2 2 2   Tired, decreased energy 2 2 2 2 2   Change in appetite 2 2 2 2 2   Feeling bad or failure about yourself  2 2 2 3 2   Trouble concentrating 0 2 2 3 3   Moving slowly or fidgety/restless 0 2 1 0 0  Suicidal thoughts 0 1 1 0 0  PHQ-9 Score 12 17 17 15 15   Difficult doing work/chores Somewhat difficult Somewhat difficult Somewhat difficult Somewhat difficult Somewhat difficult  05/08/2023    9:39 AM 03/13/2023    3:10 PM 02/06/2023    9:15 AM 01/03/2023   10:58 AM  GAD 7 : Generalized Anxiety Score  Nervous, Anxious, on Edge 3 2 2 3   Control/stop worrying 2 2 2 3   Worry too much - different things 2 2 2 3   Trouble relaxing 2 3 2 2   Restless 1 2 2 2   Easily annoyed or irritable 2 2 2 2   Afraid - awful might happen 0 2 2 2   Total GAD 7 Score 12 15 14 17   Anxiety Difficulty Somewhat difficult Somewhat difficult Somewhat difficult Somewhat difficult   Relevant past medical, surgical, family and social history reviewed and updated as indicated. Interim medical history since our last visit reviewed. Allergies and  medications reviewed and updated.  Review of Systems  Constitutional:  Negative for activity change, appetite change, diaphoresis, fatigue and fever.  Respiratory:  Negative for cough, chest tightness and shortness of breath.   Cardiovascular:  Negative for chest pain, palpitations and leg swelling.  Gastrointestinal: Negative.   Endocrine: Negative for polydipsia, polyphagia and polyuria.  Skin:  Positive for rash.  Neurological: Negative.   Psychiatric/Behavioral:  Positive for sleep disturbance. Negative for decreased concentration, self-injury and suicidal ideas. The patient is nervous/anxious.    Per HPI unless specifically indicated above     Objective:    BP 108/65   Pulse (!) 58   Temp 97.8 F (36.6 C)   Wt 153 lb (69.4 kg)   SpO2 98%   BMI 27.11 kg/m   Wt Readings from Last 3 Encounters:  05/08/23 153 lb (69.4 kg)  03/26/23 148 lb 9.6 oz (67.4 kg)  03/13/23 151 lb 6.4 oz (68.7 kg)    Physical Exam Vitals and nursing note reviewed.  Constitutional:      General: She is awake. She is not in acute distress.    Appearance: She is well-developed. She is not ill-appearing.  HENT:     Head: Normocephalic.     Right Ear: Hearing normal.     Left Ear: Hearing normal.  Eyes:     General: Lids are normal.        Right eye: No discharge.        Left eye: No discharge.     Conjunctiva/sclera: Conjunctivae normal.     Pupils: Pupils are equal, round, and reactive to light.  Neck:     Vascular: No carotid bruit.  Cardiovascular:     Rate and Rhythm: Normal rate and regular rhythm.     Heart sounds: Normal heart sounds. No murmur heard.    No gallop.  Pulmonary:     Effort: Pulmonary effort is normal. No accessory muscle usage or respiratory distress.     Breath sounds: Normal breath sounds.  Abdominal:     General: Bowel sounds are normal.     Palpations: Abdomen is soft.  Musculoskeletal:     Cervical back: Normal range of motion and neck supple.     Right lower  leg: No edema.     Left lower leg: No edema.  Skin:    General: Skin is warm and dry.  Neurological:     Mental Status: She is alert and oriented to person, place, and time.  Psychiatric:        Attention and Perception: Attention normal.        Mood and Affect: Mood normal.        Behavior: Behavior normal. Behavior is  cooperative.        Thought Content: Thought content normal.        Judgment: Judgment normal.    Results for orders placed or performed in visit on 02/06/23  Bayer DCA Hb A1c Waived  Result Value Ref Range   HB A1C (BAYER DCA - WAIVED) 5.9 (H) 4.8 - 5.6 %  Comprehensive metabolic panel  Result Value Ref Range   Glucose 100 (H) 70 - 99 mg/dL   BUN 14 8 - 27 mg/dL   Creatinine, Ser 2.13 (H) 0.57 - 1.00 mg/dL   eGFR 63 >08 MV/HQI/6.96   BUN/Creatinine Ratio 14 12 - 28   Sodium 142 134 - 144 mmol/L   Potassium 4.2 3.5 - 5.2 mmol/L   Chloride 102 96 - 106 mmol/L   CO2 20 20 - 29 mmol/L   Calcium 9.5 8.7 - 10.3 mg/dL   Total Protein 6.8 6.0 - 8.5 g/dL   Albumin 4.7 3.9 - 4.9 g/dL   Globulin, Total 2.1 1.5 - 4.5 g/dL   Albumin/Globulin Ratio 2.2 1.2 - 2.2   Bilirubin Total 0.3 0.0 - 1.2 mg/dL   Alkaline Phosphatase 111 44 - 121 IU/L   AST 16 0 - 40 IU/L   ALT 13 0 - 32 IU/L  Lipid Panel w/o Chol/HDL Ratio  Result Value Ref Range   Cholesterol, Total 114 100 - 199 mg/dL   Triglycerides 295 (H) 0 - 149 mg/dL   HDL 42 >28 mg/dL   VLDL Cholesterol Cal 31 5 - 40 mg/dL   LDL Chol Calc (NIH) 41 0 - 99 mg/dL  CBC with Differential/Platelet  Result Value Ref Range   WBC 4.9 3.4 - 10.8 x10E3/uL   RBC 4.37 3.77 - 5.28 x10E6/uL   Hemoglobin 13.5 11.1 - 15.9 g/dL   Hematocrit 41.3 24.4 - 46.6 %   MCV 91 79 - 97 fL   MCH 30.9 26.6 - 33.0 pg   MCHC 34.0 31.5 - 35.7 g/dL   RDW 01.0 27.2 - 53.6 %   Platelets 132 (L) 150 - 450 x10E3/uL   Neutrophils 56 Not Estab. %   Lymphs 32 Not Estab. %   Monocytes 9 Not Estab. %   Eos 2 Not Estab. %   Basos 1 Not Estab. %    Neutrophils Absolute 2.8 1.4 - 7.0 x10E3/uL   Lymphocytes Absolute 1.6 0.7 - 3.1 x10E3/uL   Monocytes Absolute 0.4 0.1 - 0.9 x10E3/uL   EOS (ABSOLUTE) 0.1 0.0 - 0.4 x10E3/uL   Basophils Absolute 0.1 0.0 - 0.2 x10E3/uL   Immature Granulocytes 0 Not Estab. %   Immature Grans (Abs) 0.0 0.0 - 0.1 x10E3/uL  Vitamin B12  Result Value Ref Range   Vitamin B-12 >2000 (H) 232 - 1245 pg/mL      Assessment & Plan:   Problem List Items Addressed This Visit       Cardiovascular and Mediastinum   Hot flashes due to menopause    Chronic, stable.  Improved with Celexa and Gabapentin.  Continue current regimen and adjust as needed.        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  ?related to [place].  Will trial treating as fungal --Diflucan one dose sent in and Miconazole cream + Mupirocin.  Educated her on regimen.  To notify provider if benefit or ongoing flare.        Endocrine   Hyperlipidemia associated  with type 2 diabetes mellitus (HCC)    Chronic, ongoing.  Repeat lipid panel today and focus on diet.  Continue Lovaza and Crestor at this time.      Relevant Orders   Bayer DCA Hb A1c Waived   Lipid Panel w/o Chol/HDL Ratio   Type 2 diabetes mellitus with hyperglycemia [facility]) - Primary    Diagnosed August 2023.  A1c in May 5.9%, recheck today.  Urine ALB 150 February 2024 -- no current ACE or ARB, may benefit from this in future -- will discuss further next visit, trying to maintain minimal medication per patient wishes.  Did not tolerate Ozempic, major GI issues, and did not tolerate Metformin.  Continue Farxiga 5 MG at this time as is tolerating and adjust as needed.  Educated her on this and side effects to inform provider of + need to ensure increased hydration.  Continue to monitor blood sugar at home daily and document.  Continue focus on diabetic diet.   - Need ACE or ARB, is on statin -  Vaccinations up to date. - Foot and eye exams up to date.      Relevant Orders   Bayer DCA Hb A1c Waived   Basic metabolic panel     Hematopoietic and Hemostatic   Thrombocytopenia (HCC)    Noticed on past labs, mild.  Recheck today and monitor closely.      Relevant Orders   CBC with Differential/Platelet     Other   B12 deficiency    Chronic, improved.  Off Metformin and taking B12 injections with improvement in angular cheilitis flares -- continue this and check level next visit.      Generalized anxiety disorder    Chronic, ongoing  Caregiver to her mother who has dementia.  Denies SI/HI.  Continue Celexa 40 MG daily and Buspar to 10 MG BID.  Continue Trazodone PRN 25-50 MG at HS for short period for sleep needs. Discussed various methods of meditation and relaxation techniques at home.  Recommend she speak to [facility] about Respite Care and looking in hospice caregiver support group.          Follow up plan: Return in about 3 months (around 08/08/2023) for T2DM, HLD, MOOD.

## 2023-05-08 NOTE — Assessment & Plan Note (Signed)
Noticed on past labs, mild.  Recheck today and monitor closely.

## 2023-05-08 NOTE — Assessment & Plan Note (Signed)
Diagnosed August 2023.  A1c in May 5.9%, recheck today.  Urine ALB 150 February 2024 -- no current ACE or ARB, may benefit from this in future -- will discuss further next visit, trying to maintain minimal medication per patient wishes.  Did not tolerate Ozempic, major GI issues, and did not tolerate Metformin.  Continue Farxiga 5 MG at this time as is tolerating and adjust as needed.  Educated her on this and side effects to inform provider of + need to ensure increased hydration.  Continue to monitor blood sugar at home daily and document.  Continue focus on diabetic diet.   - Need ACE or ARB, is on statin - Vaccinations up to date. - Foot and eye exams up to date.

## 2023-05-08 NOTE — Progress Notes (Signed)
Contacted via MyChart   Good afternoon Karen Lambert, your A1c is back.  It has trended up a little from 5.9% to 6.2%.  We may want to increase Farxiga to 10 MG.  Would you be okay with this?  Let me know.:)

## 2023-05-08 NOTE — Assessment & Plan Note (Signed)
Ongoing issue with intermittent flares that did improve with B12 injections, however current flare.  ?related to [place].  Will trial treating as fungal --Diflucan one dose sent in and Miconazole cream + Mupirocin.  Educated her on regimen.  To notify provider if benefit or ongoing flare.

## 2023-05-09 LAB — CBC WITH DIFFERENTIAL/PLATELET
Basophils Absolute: 0 10*3/uL (ref 0.0–0.2)
Basos: 1 %
EOS (ABSOLUTE): 0.1 10*3/uL (ref 0.0–0.4)
Eos: 2 %
Hematocrit: 38.3 % (ref 34.0–46.6)
Hemoglobin: 12.7 g/dL (ref 11.1–15.9)
Immature Grans (Abs): 0 10*3/uL (ref 0.0–0.1)
Immature Granulocytes: 0 %
Lymphocytes Absolute: 1.3 10*3/uL (ref 0.7–3.1)
Lymphs: 27 %
MCH: 30.5 pg (ref 26.6–33.0)
MCHC: 33.2 g/dL (ref 31.5–35.7)
MCV: 92 fL (ref 79–97)
Monocytes Absolute: 0.3 10*3/uL (ref 0.1–0.9)
Monocytes: 6 %
Neutrophils Absolute: 3.1 10*3/uL (ref 1.4–7.0)
Neutrophils: 64 %
Platelets: 140 10*3/uL — ABNORMAL LOW (ref 150–450)
RBC: 4.17 x10E6/uL (ref 3.77–5.28)
RDW: 12.4 % (ref 11.7–15.4)
WBC: 4.9 10*3/uL (ref 3.4–10.8)

## 2023-05-09 LAB — BASIC METABOLIC PANEL
BUN/Creatinine Ratio: 12 (ref 12–28)
BUN: 11 mg/dL (ref 8–27)
CO2: 21 mmol/L (ref 20–29)
Calcium: 9.3 mg/dL (ref 8.7–10.3)
Chloride: 105 mmol/L (ref 96–106)
Creatinine, Ser: 0.95 mg/dL (ref 0.57–1.00)
Glucose: 228 mg/dL — ABNORMAL HIGH (ref 70–99)
Potassium: 3.9 mmol/L (ref 3.5–5.2)
Sodium: 141 mmol/L (ref 134–144)
eGFR: 68 mL/min/{1.73_m2} (ref >59)

## 2023-05-09 LAB — LIPID PANEL W/O CHOL/HDL RATIO
Cholesterol, Total: 112 mg/dL (ref 100–199)
HDL: 47 mg/dL (ref >39)
LDL Chol Calc (NIH): 30 mg/dL (ref 0–99)
Triglycerides: 229 mg/dL — ABNORMAL HIGH (ref 0–149)
VLDL Cholesterol Cal: 35 mg/dL (ref 5–40)

## 2023-05-09 MED ORDER — DAPAGLIFLOZIN PROPANEDIOL 10 MG PO TABS: 10.0000 mg | ORAL_TABLET | Freq: Every day | ORAL | 4 refills | Status: DC

## 2023-05-09 NOTE — Progress Notes (Signed)
Contacted via MyChart   Good evening Karen Lambert, your labs have returned: - Kidney function, creatinine and eGFR, remains normal.  Sugar was a bit high on this check. - Cholesterol levels show great LDL number, but triglycerides remain a little high.  Continue medications and we will adjust as needed + focus on healthy diet. - CBC continues to show mild low platelets, but no worsening.  We will continue to monitor on labs.  Sometimes medications can cause this, so avoid Aspirin & Ibuprofen + do not take Zyrtec.  Claritin is okay.  Any questions? Keep being stellar!!  Thank you for allowing me to participate in your care.  I appreciate you. Kindest regards, Aijalon Kirtz

## 2023-05-10 DIAGNOSIS — U071 COVID-19: Secondary | ICD-10-CM

## 2023-05-10 HISTORY — DX: COVID-19: U07.1

## 2023-05-11 NOTE — Telephone Encounter (Signed)
Noted  

## 2023-05-11 NOTE — Telephone Encounter (Signed)
Called patient to schedule a virtual/mychart visit for today. No answer left detailed message for patient to give [place] a call back. Okay for [facility] to schedule if there is availability for this afternoon.

## 2023-06-06 ENCOUNTER — Encounter: Payer: Self-pay | Admitting: Otolaryngology

## 2023-06-11 NOTE — Discharge Instructions (Signed)
[facility] ENDOSCOPIC SINUS SURGERY [facility]  What is Functional Endoscopic Sinus Surgery?  The Surgery involves making the natural openings of the sinuses larger by removing the bony partitions that separate the sinuses from the nasal cavity.  The natural sinus lining is preserved as much as possible to allow the sinuses to resume normal function after the surgery.  In some patients nasal polyps (excessively swollen lining of the sinuses) may be removed to relieve obstruction of the sinus openings.  The surgery is performed through the nose using lighted scopes, which eliminates the need for incisions on the face.  A septoplasty is a different procedure which is sometimes performed with sinus surgery.  It involves straightening the boy partition that separates the two sides of your nose.  A crooked or deviated septum may need repair if is obstructing the sinuses or nasal airflow.  Turbinate reduction is also often performed during sinus surgery.  The turbinates are bony proturberances from the side walls of the nose which swell and can obstruct the nose in patients with sinus and allergy problems.  Their size can be surgically reduced to help relieve nasal obstruction.  What Can Sinus Surgery Do For Me?  Sinus surgery can reduce the frequency of sinus infections requiring antibiotic treatment.  This can provide improvement in nasal congestion, post-nasal drainage, facial pressure and nasal obstruction.  Surgery will NOT prevent you from ever having an infection again, so it usually only for patients who get infections 4 or more times yearly requiring antibiotics, or for infections that do not clear with antibiotics.  It will not cure nasal allergies, so patients with allergies may still require medication to treat their allergies after surgery. Surgery may improve headaches related to sinusitis, however, some people will continue to  require medication to control sinus headaches related to allergies.  Surgery will do nothing for other forms of headache (migraine, tension or cluster).  What Are the Risks of Endoscopic Sinus Surgery?  Current techniques allow surgery to be performed safely with little risk, however, there are rare complications that patients should be aware of.  Because the sinuses are located around the eyes, there is risk of eye injury, including blindness, though again, this would be quite rare. This is usually a result of bleeding behind the eye during surgery, which can effect vision, though there are treatments to protect the vision and prevent permanent injury. More serious complications would include bleeding inside the brain cavity or damage to the brain.This happens when the fluid around the brain leaks out into the sinus cavity.  Again, all of these complications are uncommon, and spinal fluid leaks can be safely managed surgically if they occur.  The most common complication of sinus surgery is bleeding from the nose, which may require packing or cauterization of the nose.  Patients with polyps may experience recurrence of the polyps that would require revision surgery.  Alterations of sense of smell or injury to the tear ducts are also rare complications.   What is the Surgery Like, and what is the Recovery?  The Surgery usually takes a couple of hours to perform, and is usually performed under a general anesthetic (completely asleep).  Patients are usually discharged home after a couple of hours.  Sometimes during surgery it is necessary to pack the nose to control bleeding, and the packing is left in place for 24 - 48 hours, and removed by your surgeon.  If   a septoplasty was performed during the procedure, there is often a splint placed which must be removed after 5-7 days.   Discomfort: Pain is usually mild to moderate, and can be controlled by prescription pain medication or acetaminophen (Tylenol).   Aspirin, Ibuprofen (Advil, Motrin), or Naprosyn (Aleve) should be avoided, as they can cause increased bleeding.  Most patients feel sinus pressure like they have a bad head cold for several days.  Sleeping with your head elevated can help reduce swelling and facial pressure, as can ice packs over the face.  A humidifier may be helpful to keep the mucous and blood from drying in the nose.   Diet: There are no specific diet restrictions, however, you should generally start with clear liquids and a light diet of bland foods because the anesthetic can cause some nausea.  Advance your diet depending on how your stomach feels.  Taking your pain medication with food will often help reduce stomach upset which pain medications can cause.  Nasal Saline Irrigation: It is important to remove blood clots and dried mucous from the nose as it is healing.  This is done by having you irrigate the nose at least 3 - 4 times daily with a salt water solution.  We recommend using NeilMed Sinus Rinse (available at the drug store).  Fill the squeeze bottle with the solution, bend over a sink, and insert the tip of the squeeze bottle into the nose  of an inch.  Point the tip of the squeeze bottle towards the inside corner of the eye on the same side your irrigating.  Squeeze the bottle and gently irrigate the nose.  If you bend forward as you do this, most of the fluid will flow back out of the nose, instead of down your throat.   The solution should be warm, near body temperature, when you irrigate.   Each time you irrigate, you should use a full squeeze bottle.   Note that if you are instructed to use Nasal Steroid Sprays at any time after your surgery, irrigate with saline BEFORE using the steroid spray, so you do not wash it all out of the nose. Another product, Nasal Saline Gel (such as AYR Nasal Saline Gel) can be applied in each nostril 3 - 4 times daily to moisture the nose and reduce scabbing or crusting.  Bleeding:   Bloody drainage from the nose can be expected for several days, and patients are instructed to irrigate their nose frequently with salt water to help remove mucous and blood clots.  The drainage may be dark red or brown, though some fresh blood may be seen intermittently, especially after irrigation.  Do not blow you nose, as bleeding may occur. If you must sneeze, keep your mouth open to allow air to escape through your mouth.  If heavy bleeding occurs: Irrigate the nose with saline to rinse out clots, then spray the nose 3 - 4 times with Afrin Nasal Decongestant Spray.  The spray will constrict the blood vessels to slow bleeding.  Pinch the lower half of your nose shut to apply pressure, and lay down with your head elevated.  Ice packs over the nose may help as well. If bleeding persists despite these measures, you should notify your doctor.  Do not use the Afrin routinely to control nasal congestion after surgery, as it can result in worsening congestion and may affect healing.     Activity: Return to work varies among patients. Most patients will be out   of work at least 5 - 7 days to recover.  Patient may return to work after they are off of narcotic pain medication, and feeling well enough to perform the functions of their job.  Patients must avoid heavy lifting (over 10 pounds) or strenuous physical for 2 weeks after surgery, so your employer may need to assign you to light duty, or keep you out of work longer if light duty is not possible.  NOTE: you should not drive, operate dangerous machinery, do any mentally demanding tasks or make any important legal or financial decisions while on narcotic pain medication and recovering from the general anesthetic.    Call Your Doctor Immediately if You Have Any of the Following: Bleeding that you cannot control with the above measures Loss of vision, double vision, bulging of the eye or black eyes. Fever over 101 degrees Neck stiffness with severe headache,  fever, nausea and change in mental state. You are always encouraged to call anytime with concerns, however, please call with requests for pain medication refills during office hours.  Office Endoscopy: During follow-up visits your doctor will remove any packing or splints that may have been placed and evaluate and clean your sinuses endoscopically.  Topical anesthetic will be used to make this as comfortable as possible, though you may want to take your pain medication prior to the visit.  How often this will need to be done varies from patient to patient.  After complete recovery from the surgery, you may need follow-up endoscopy from time to time, particularly if there is concern of recurrent infection or nasal polyps.  

## 2023-06-13 ENCOUNTER — Other Ambulatory Visit: Payer: Self-pay

## 2023-06-13 ENCOUNTER — Encounter
Admission: RE | Disposition: A | Discharge status: Home / Self Care | Payer: Self-pay | Source: Home / Self Care | Attending: Otolaryngology

## 2023-06-13 ENCOUNTER — Ambulatory Visit: Payer: Managed Care, Other (non HMO) | Admitting: Anesthesiology

## 2023-06-13 ENCOUNTER — Ambulatory Visit
Admission: RE | Admit: 2023-06-13 | Discharge: 2023-06-13 | Disposition: A | Discharge status: Home / Self Care | Payer: Managed Care, Other (non HMO) | Attending: Otolaryngology | Admitting: Otolaryngology

## 2023-06-13 ENCOUNTER — Encounter: Payer: Self-pay | Admitting: Otolaryngology

## 2023-06-13 DIAGNOSIS — E785 Hyperlipidemia, unspecified: Secondary | ICD-10-CM | POA: Insufficient documentation

## 2023-06-13 DIAGNOSIS — J342 Deviated nasal septum: Secondary | ICD-10-CM | POA: Diagnosis present

## 2023-06-13 DIAGNOSIS — J301 Allergic rhinitis due to pollen: Secondary | ICD-10-CM | POA: Diagnosis not present

## 2023-06-13 DIAGNOSIS — E119 Type 2 diabetes mellitus without complications: Secondary | ICD-10-CM | POA: Diagnosis not present

## 2023-06-13 DIAGNOSIS — Z7985 Long-term (current) use of injectable non-insulin antidiabetic drugs: Secondary | ICD-10-CM | POA: Insufficient documentation

## 2023-06-13 DIAGNOSIS — G4730 Sleep apnea, unspecified: Secondary | ICD-10-CM | POA: Diagnosis not present

## 2023-06-13 DIAGNOSIS — J343 Hypertrophy of nasal turbinates: Secondary | ICD-10-CM | POA: Insufficient documentation

## 2023-06-13 DIAGNOSIS — Z87891 Personal history of nicotine dependence: Secondary | ICD-10-CM | POA: Diagnosis not present

## 2023-06-13 DIAGNOSIS — Z859 Personal history of malignant neoplasm, unspecified: Secondary | ICD-10-CM | POA: Insufficient documentation

## 2023-06-13 DIAGNOSIS — F411 Generalized anxiety disorder: Secondary | ICD-10-CM | POA: Insufficient documentation

## 2023-06-13 HISTORY — PX: NASAL SEPTOPLASTY W/ TURBINOPLASTY: SHX2070

## 2023-06-13 HISTORY — DX: Dizziness and giddiness: R42

## 2023-06-13 HISTORY — DX: Type 2 diabetes mellitus without complications: E11.9

## 2023-06-13 HISTORY — DX: Generalized anxiety disorder: F41.1

## 2023-06-13 HISTORY — DX: Sleep apnea, unspecified: G47.30

## 2023-06-13 HISTORY — DX: Presence of dental prosthetic device (complete) (partial): Z97.2

## 2023-06-13 LAB — GLUCOSE, CAPILLARY: Glucose-Capillary: 112 mg/dL — ABNORMAL HIGH (ref 70–99)

## 2023-06-13 SURGERY — SEPTOPLASTY, NOSE, WITH NASAL TURBINATE REDUCTION
Anesthesia: General | Site: Nose | Laterality: Bilateral

## 2023-06-13 MED ORDER — LIDOCAINE-EPINEPHRINE 1 %-1:100000 IJ SOLN
INTRAMUSCULAR | Status: DC | PRN
  Administered 2023-06-13: 8 mL

## 2023-06-13 MED ORDER — ROCURONIUM BROMIDE 100 MG/10ML IV SOLN
INTRAVENOUS | Status: DC | PRN
  Administered 2023-06-13: 70 mg via INTRAVENOUS

## 2023-06-13 MED ORDER — PROPOFOL 10 MG/ML IV BOLUS
INTRAVENOUS | Status: DC | PRN
  Administered 2023-06-13: 110 mg via INTRAVENOUS

## 2023-06-13 MED ORDER — OXYCODONE-ACETAMINOPHEN 5-325 MG PO TABS: 1.0000 | ORAL_TABLET | ORAL | 0 refills | Status: DC | PRN

## 2023-06-13 MED ORDER — MIDAZOLAM HCL 2 MG/2ML IJ SOLN
INTRAMUSCULAR | Status: DC | PRN
  Administered 2023-06-13: 2 mg via INTRAVENOUS

## 2023-06-13 MED ORDER — ONDANSETRON HCL 4 MG/2ML IJ SOLN
INTRAMUSCULAR | Status: DC | PRN
  Administered 2023-06-13: 4 mg via INTRAVENOUS

## 2023-06-13 MED ORDER — MIDAZOLAM HCL 2 MG/2ML IJ SOLN
INTRAMUSCULAR | Status: AC
  Filled 2023-06-13: qty 2

## 2023-06-13 MED ORDER — PHENYLEPHRINE 80 MCG/ML (10ML) SYRINGE FOR IV PUSH (FOR BLOOD PRESSURE SUPPORT)
PREFILLED_SYRINGE | INTRAVENOUS | Status: DC | PRN
  Administered 2023-06-13: 160 ug via INTRAVENOUS
  Administered 2023-06-13: 80 ug via INTRAVENOUS

## 2023-06-13 MED ORDER — LACTATED RINGERS IV SOLN: INTRAVENOUS | Status: DC

## 2023-06-13 MED ORDER — DOCUSATE SODIUM 100 MG PO CAPS: 100.0000 mg | ORAL_CAPSULE | Freq: Two times a day (BID) | ORAL | 0 refills | Status: DC

## 2023-06-13 MED ORDER — DEXAMETHASONE SODIUM PHOSPHATE 4 MG/ML IJ SOLN
INTRAMUSCULAR | Status: AC
  Filled 2023-06-13: qty 2

## 2023-06-13 MED ORDER — FENTANYL CITRATE (PF) 100 MCG/2ML IJ SOLN
INTRAMUSCULAR | Status: AC
  Filled 2023-06-13: qty 2

## 2023-06-13 MED ORDER — DEXAMETHASONE SODIUM PHOSPHATE 10 MG/ML IJ SOLN
INTRAMUSCULAR | Status: DC | PRN
  Administered 2023-06-13: 8 mg via INTRAVENOUS

## 2023-06-13 MED ORDER — FENTANYL CITRATE (PF) 100 MCG/2ML IJ SOLN
INTRAMUSCULAR | Status: DC | PRN
  Administered 2023-06-13 (×2): 50 ug via INTRAVENOUS

## 2023-06-13 MED ORDER — AMOXICILLIN-POT CLAVULANATE 875-125 MG PO TABS: 1.0000 | ORAL_TABLET | Freq: Two times a day (BID) | ORAL | 0 refills | Status: AC

## 2023-06-13 MED ORDER — ACETAMINOPHEN 10 MG/ML IV SOLN
INTRAVENOUS | Status: DC | PRN
  Administered 2023-06-13: 1000 mg via INTRAVENOUS

## 2023-06-13 MED ORDER — LIDOCAINE HCL (PF) 2 % IJ SOLN
INTRAMUSCULAR | Status: AC
  Filled 2023-06-13: qty 5

## 2023-06-13 MED ORDER — LIDOCAINE HCL (CARDIAC) PF 100 MG/5ML IV SOSY
PREFILLED_SYRINGE | INTRAVENOUS | Status: DC | PRN
  Administered 2023-06-13: 100 mg via INTRAVENOUS

## 2023-06-13 MED ORDER — OXYMETAZOLINE HCL 0.05 % NA SOLN
NASAL | Status: DC | PRN
  Administered 2023-06-13: 1 via TOPICAL

## 2023-06-13 MED ORDER — BACITRACIN 500 UNIT/GM EX OINT
TOPICAL_OINTMENT | CUTANEOUS | Status: DC | PRN
  Administered 2023-06-13: 1 via TOPICAL

## 2023-06-13 MED ORDER — ONDANSETRON HCL 4 MG PO TABS: 4.0000 mg | ORAL_TABLET | Freq: Three times a day (TID) | ORAL | 0 refills | Status: DC | PRN

## 2023-06-13 MED ORDER — ONDANSETRON HCL 4 MG/2ML IJ SOLN
INTRAMUSCULAR | Status: AC
  Filled 2023-06-13: qty 2

## 2023-06-13 MED ORDER — SUGAMMADEX SODIUM 200 MG/2ML IV SOLN
INTRAVENOUS | Status: DC | PRN
  Administered 2023-06-13: 200 mg via INTRAVENOUS

## 2023-06-13 MED ORDER — ACETAMINOPHEN 10 MG/ML IV SOLN
INTRAVENOUS | Status: AC
  Filled 2023-06-13: qty 100

## 2023-06-13 SURGICAL SUPPLY — 20 items
ANTIFOG SOL W/FOAM PAD STRL (MISCELLANEOUS) ×1
CANISTER SUCT 1200ML W/VALVE (MISCELLANEOUS) ×1 IMPLANT
COAG SUCTION FOOTSWITCH 10FR (SUCTIONS) IMPLANT
DRESSING NASL FOAM PST OP SINU (MISCELLANEOUS) ×1 IMPLANT
DRSG NASAL FOAM POST OP SINU (MISCELLANEOUS) ×1
ELECT REM PT RETURN 9FT ADLT (ELECTROSURGICAL) ×1
ELECTRODE REM PT RTRN 9FT ADLT (ELECTROSURGICAL) ×1 IMPLANT
GLOVE SURG GAMMEX PI TX LF 7.5 (GLOVE) ×2 IMPLANT
KIT TURNOVER KIT A (KITS) ×1 IMPLANT
PACK ENT CUSTOM (PACKS) ×1 IMPLANT
PATTIES SURGICAL .5 X3 (DISPOSABLE) ×1 IMPLANT
SOLUTION ANTFG W/FOAM PAD STRL (MISCELLANEOUS) ×1 IMPLANT
SPLINT NASAL SEPTAL BLV .50 ST (MISCELLANEOUS) ×1 IMPLANT
STRAP BODY AND KNEE 60X3 (MISCELLANEOUS) ×1 IMPLANT
SUCTION COAG ELEC 10 HAND CTRL (ELECTROSURGICAL) IMPLANT
SUT CHROMIC 4 0 RB 1X27 (SUTURE) ×1 IMPLANT
SUT ETHILON 3-0 FS-10 30 BLK (SUTURE) ×1
SUTURE EHLN 3-0 FS-10 30 BLK (SUTURE) ×1 IMPLANT
TOWEL OR 17X26 4PK STRL BLUE (TOWEL DISPOSABLE) ×1 IMPLANT
WATER STERILE IRR 250ML POUR (IV SOLUTION) ×1 IMPLANT

## 2023-06-13 NOTE — Transfer of Care (Signed)
Immediate Anesthesia Transfer of Care Note  Patient: Karen Lambert  Procedure(s) Performed: NASAL SEPTOPLASTY WITH INFERIORTURBINATE REDUCTION (Bilateral: Nose)  Patient Location: PACU  Anesthesia Type:General  Level of Consciousness: awake and alert   Airway & Oxygen Therapy: Patient Spontanous Breathing and Patient connected to face mask oxygen  Post-op Assessment: Report given to RN and Post -op Vital signs reviewed and stable  Post vital signs: Reviewed and stable  Last Vitals:  Vitals Value Taken Time  BP 138/67 06/13/23 0930  Temp    Pulse 76 06/13/23 0932  Resp 12 06/13/23 0932  SpO2 96 % 06/13/23 0932  Vitals shown include unfiled device data.  Last Pain:  Vitals:   06/13/23 0720  TempSrc: Temporal  PainSc: 0-No pain         Complications: No notable events documented.

## 2023-06-13 NOTE — Anesthesia Procedure Notes (Signed)
Procedure Name: Intubation Date/Time: 06/13/2023 8:16 AM  Performed by: Irving Burton, CRNAPre-anesthesia Checklist: Patient identified, Patient being monitored, Timeout performed, Emergency Drugs available and Suction available Patient Re-evaluated:Patient Re-evaluated prior to induction Oxygen Delivery Method: Circle system utilized Preoxygenation: Pre-oxygenation with 100% oxygen Induction Type: IV induction Ventilation: Mask ventilation without difficulty Laryngoscope Size: Mac and 3 Grade View: Grade II Tube type: Oral Rae Tube size: 7.0 mm Number of attempts: 1 Airway Equipment and Method: Stylet Placement Confirmation: ETT inserted through vocal cords under direct vision, positive ETCO2 and breath sounds checked- equal and bilateral Secured at: 21 cm Tube secured with: Tape Dental Injury: Teeth and Oropharynx as per pre-operative assessment

## 2023-06-13 NOTE — Op Note (Signed)
..06/13/2023  9:22 AM    Karen Lambert  [id number]    Pre-Op Dx:  Deviated Nasal Septum, Hypertrophic Inferior Turbinates  Post-op Dx: Same  Proc:   1)  Nasal Septoplasty 2)  Bilateral Partial Reduction Inferior Turbinates   Surg:  Maelani Yarbro  Anes:  GOT  EBL:  <44ml  Comp:  none  Findings: acute left maxillary sinusitis seen with purulence from left maxillary accessory ostia.  Severe left sided septal deviation anteriorly cartilagenous and more posterior bone.  Bilateral inferior turbinate hypertrophy  Procedure: With the patient in a comfortable supine position,  general orotracheal anesthesia was induced without difficulty.  The patient received preoperative Afrin spray for topical decongestion and vasoconstriction.  At an appropriate level, the patient was placed in a semi-sitting position.  Nasal vibrissae were trimmed.   1% Xylocaine with 1:100,000 epinephrine, 8 cc's, was infiltrated into the anterior floor of the nose, into the nasal spine region, into the membranous columella, and finally into the submucoperichondrial plane of the septum on both sides.  Several minutes were allowed for this to take effect.  Cottoniod pledgetts soaked in Afrin were placed into both nasal cavities and left while the patient was prepped and draped in the standard fashion.   A proper time-out was performed.  The materials were removed from the nose and observed to be intact and correct in number.  The nose was inspected with a headlight and zero degree endoscope with the findings as described above.  A left Killian incision was sharply executed and carried down to the caudal edge of the quadrangular cartilage with a 15 blade scapel.  A mucoperichondrial flap was elelvated along the quadrangular plate back to the bony-cartilaginous junction using caudal elevator and freer elevator. The mucoperiostium was then elevated along the ethmoid plate and the vomer. An itracartilagenous incision  was made using the freer elevator and a contralateral mucoperichondiral flap was elevated using a freer elevator.  Care was taken to avoid any large rents or opposing rents in the mucoperichondrial flap.  Boney spurs of the vomer and maxillary crest were removed with Takahashi forceps.  The area of cartilagenous deviation was removed with combination of freer elevator and Takahashi forceps creating a widely patent nasal cavity as well as resolution of obstruction from the cartilagenous deviation. The mucosal flaps were placed back into their anatomic position to allow visualization of the airways. The septum now sat in the midline with an improved airway.  A 4-0 Chromic was used to close the Harvey incision as well.   The inferior turbinates were then inspected.  Under endoscopic visualization, the inferior turbinates were infractured bilaterally with a Therapist, nutritional.  A kelly clamp was attached to the anterior-inferior third of each inferior turbinate for approximately one minute.  Under endoscopic visualization, Tru-cutting forceps were used to remove the anterior-inferior third of each inferior turbinate.  Electrocautery was used to control bleeding in the area. The remaining turbinate was then outfractured to open up the airway further. There was no significant bleeding noted. The right turbinate was then trimmed and outfractured in a similar fashion.  The airways were then visualized and showed open passageways on both sides that were significantly improved compared to before surgery.    Purulence was seen emanating from the left maxillary sinus accessory ostia.  A curved suction was used to irrigate this accessory ostia and wash out purulence that was seen.     There was no signifcant bleeding. Nasal splints were applied to  both sides of the septum using Xomed 0.11mm regular sized splints that were trimmed, and then held in position with a 3-0 Nylon through and through suture.  Stamberger sinufoam  was placed along the cut edge of the inferior turbinates bilaterally.  The patient was turned back over to anesthesia, and awakened, extubated, and taken to the PACU in satisfactory condition.  Dispo:   PACU to home  Plan: Ice, elevation, narcotic analgesia, steroid taper, and prophylactic antibiotics for the duration of indwelling nasal foreign bodies.  We will reevaluate the patient in the office in 6 days and remove the septal splints.  Return to work in 10 days, strenuous activities in two weeks.   Karen Lambert 06/13/2023 9:22 AM

## 2023-06-13 NOTE — Anesthesia Postprocedure Evaluation (Signed)
Anesthesia Post Note  Patient: Karen Lambert  Procedure(s) Performed: NASAL SEPTOPLASTY WITH INFERIORTURBINATE REDUCTION (Bilateral: Nose)  Patient location during evaluation: PACU Anesthesia Type: General Level of consciousness: awake and alert Pain management: pain level controlled Vital Signs Assessment: post-procedure vital signs reviewed and stable Respiratory status: spontaneous breathing, nonlabored ventilation, respiratory function stable and patient connected to nasal cannula oxygen Cardiovascular status: blood pressure returned to baseline and stable Postop Assessment: no apparent nausea or vomiting Anesthetic complications: no   No notable events documented.   Last Vitals:  Vitals:   06/13/23 0930 06/13/23 0952  BP: 138/67 124/62  Pulse: 83 63  Resp: 15 13  Temp: (!) 36.4 C (!) 36.4 C  SpO2: 94% 99%    Last Pain:  Vitals:   06/13/23 0952  TempSrc:   PainSc: 0-No pain                 Marisue Humble

## 2023-06-13 NOTE — H&P (Signed)
..  History and Physical paper copy reviewed and updated date of procedure and will be scanned into system.  Patient seen and examined.  

## 2023-06-14 ENCOUNTER — Encounter: Payer: Self-pay | Admitting: Otolaryngology

## 2023-06-19 ENCOUNTER — Encounter: Payer: Self-pay | Admitting: Nurse Practitioner

## 2023-07-18 ENCOUNTER — Encounter: Payer: Self-pay | Admitting: Nurse Practitioner

## 2023-08-05 NOTE — Patient Instructions (Signed)
Be Involved in Caring For Your Health:  Taking Medications When medications are taken as directed, they can greatly improve your health. But if they are not taken as prescribed, they may not work. In some cases, not taking them correctly can be harmful. To help ensure your treatment remains effective and safe, understand your medications and how to take them. Bring your medications to each visit for review by your provider.  Your lab results, notes, and after visit summary will be available on My Chart. We strongly encourage you to use this feature. If lab results are abnormal the clinic will contact you with the appropriate steps. If the clinic does not contact you assume the results are satisfactory. You can always view your results on My Chart. If you have questions regarding your health or results, please contact the clinic during office hours. You can also ask questions on My Chart.  We at [facility] are grateful that you chose [place] to provide your care. We strive to provide evidence-based and compassionate care and are always looking for feedback. If you get a survey from the clinic please complete this so we can hear your opinions.  Diabetes Mellitus and Foot Care Diabetes, also called diabetes mellitus, may cause problems with your feet and legs because of poor blood flow (circulation). Poor circulation may make your skin: Become thinner and drier. Break more easily. Heal more slowly. Peel and crack. You may also have nerve damage (neuropathy). This can cause decreased feeling in your legs and feet. This means that you may not notice minor injuries to your feet that could lead to more serious problems. Finding and treating problems early is the best way to prevent future foot problems. How to care for your feet Foot hygiene  Wash your feet daily with warm water and mild soap. Do not use hot water. Then, pat your feet and the areas between your toes until they are fully dry. Do  not soak your feet. This can dry your skin. Trim your toenails straight across. Do not dig under them or around the cuticle. File the edges of your nails with an emery board or nail file. Apply a moisturizing lotion or petroleum jelly to the skin on your feet and to dry, brittle toenails. Use lotion that does not contain alcohol and is unscented. Do not apply lotion between your toes. Shoes and socks Wear clean socks or stockings every day. Make sure they are not too tight. Do not wear knee-high stockings. These may decrease blood flow to your legs. Wear shoes that fit well and have enough cushioning. Always look in your shoes before you put them on to be sure there are no objects inside. To break in new shoes, wear them for just a few hours a day. This prevents injuries on your feet. Wounds, scrapes, corns, and calluses  Check your feet daily for blisters, cuts, bruises, sores, and redness. If you cannot see the bottom of your feet, use a mirror or ask someone for help. Do not cut off corns or calluses or try to remove them with medicine. If you find a minor scrape, cut, or break in the skin on your feet, keep it and the skin around it clean and dry. You may clean these areas with mild soap and water. Do not clean the area with peroxide, alcohol, or iodine. If you have a wound, scrape, corn, or callus on your foot, look at it several times a day to make sure it  is healing and not infected. Check for: Redness, swelling, or pain. Fluid or blood. Warmth. Pus or a bad smell. General tips Do not cross your legs. This may decrease blood flow to your feet. Do not use heating pads or hot water bottles on your feet. They may burn your skin. If you have lost feeling in your feet or legs, you may not know this is happening until it is too late. Protect your feet from hot and cold by wearing shoes, such as at the beach or on hot pavement. Schedule a complete foot exam at least once a year or more often if  you have foot problems. Report any cuts, sores, or bruises to your health care provider right away. Where to find more information American Diabetes Association: diabetes.org Association of Diabetes Care & Education Specialists: diabeteseducator.org Contact a health care provider if: You have a condition that increases your risk of infection, and you have any cuts, sores, or bruises on your feet. You have an injury that is not healing. You have redness on your legs or feet. You feel burning or tingling in your legs or feet. You have pain or cramps in your legs and feet. Your legs or feet are numb. Your feet always feel cold. You have pain around any toenails. Get help right away if: You have a wound, scrape, corn, or callus on your foot and: You have signs of infection. You have a fever. You have a red line going up your leg. This information is not intended to replace advice given to you by your health care provider. Make sure you discuss any questions you have with your health care provider. Document Revised: 03/01/2022 Document Reviewed: 03/01/2022 Elsevier Patient Education  2024 [employer].

## 2023-08-08 ENCOUNTER — Encounter: Payer: Self-pay | Admitting: Nurse Practitioner

## 2023-08-08 ENCOUNTER — Ambulatory Visit: Payer: Managed Care, Other (non HMO) | Admitting: Nurse Practitioner

## 2023-08-08 VITALS — BP 132/69 | HR 65 | Temp 98.0°F | Ht 62.8 in | Wt 155.6 lb

## 2023-08-08 DIAGNOSIS — E1165 Type 2 diabetes mellitus with hyperglycemia: Secondary | ICD-10-CM | POA: Diagnosis not present

## 2023-08-08 DIAGNOSIS — E785 Hyperlipidemia, unspecified: Secondary | ICD-10-CM

## 2023-08-08 DIAGNOSIS — F411 Generalized anxiety disorder: Secondary | ICD-10-CM

## 2023-08-08 DIAGNOSIS — D696 Thrombocytopenia, unspecified: Secondary | ICD-10-CM

## 2023-08-08 DIAGNOSIS — E538 Deficiency of other specified B group vitamins: Secondary | ICD-10-CM

## 2023-08-08 DIAGNOSIS — Z7985 Long-term (current) use of injectable non-insulin antidiabetic drugs: Secondary | ICD-10-CM

## 2023-08-08 DIAGNOSIS — Z23 Encounter for immunization: Secondary | ICD-10-CM | POA: Diagnosis not present

## 2023-08-08 DIAGNOSIS — N644 Mastodynia: Secondary | ICD-10-CM | POA: Insufficient documentation

## 2023-08-08 DIAGNOSIS — E1169 Type 2 diabetes mellitus with other specified complication: Secondary | ICD-10-CM | POA: Diagnosis not present

## 2023-08-08 DIAGNOSIS — E559 Vitamin D deficiency, unspecified: Secondary | ICD-10-CM

## 2023-08-08 DIAGNOSIS — N951 Menopausal and female climacteric states: Secondary | ICD-10-CM

## 2023-08-08 LAB — BAYER DCA HB A1C WAIVED: HB A1C (BAYER DCA - WAIVED): 6.3 % — ABNORMAL HIGH (ref 4.8–5.6)

## 2023-08-08 MED ORDER — CYANOCOBALAMIN 1000 MCG/ML IJ SOLN
1000.0000 ug | Freq: Once | INTRAMUSCULAR | Status: AC
Start: 2023-08-08 — End: 2023-08-08
  Administered 2023-08-08: 1000 ug via INTRAMUSCULAR

## 2023-08-08 MED ORDER — OZEMPIC (0.25 OR 0.5 MG/DOSE) 2 MG/3ML ~~LOC~~ SOPN: 0.5000 mg | PEN_INJECTOR | SUBCUTANEOUS | 4 refills | Status: DC

## 2023-08-08 NOTE — Progress Notes (Signed)
BP 132/69   Pulse 65   Temp 98 F (36.7 C) (Oral)   Ht 5' 2.8" (1.595 m)   Wt 155 lb 9.6 oz (70.6 kg)   SpO2 97%   BMI 27.74 kg/m    Subjective:    Patient ID: Karen Lambert, female    DOB: 1961-06-16, 62 y.o.   MRN: [id number]  HPI: Karen Lambert is a 62 y.o. female  Chief Complaint  Patient presents with   Depression   Diabetes   Hyperlipidemia   DIABETES She has returned to taking Ozempic, as saw dermatologist and they do not suspect mouth issues related to medication.  Taking 0.5 MG weekly, tolerating this well.  Tried Metformin which caused issues with angular cheilitis, dermatology has provided her a cream which is helping. Taking B12 injections at home. Platelet count mildly low on labs for 9 months -- 132 and 144.   Hypoglycemic episodes: occasional Polydipsia/polyuria: no Visual disturbance: no Chest pain: no Paresthesias: no Glucose Monitoring: yes  Accucheck frequency: Daily  Fasting glucose: 60 to 90 -- more in 90 range  Post prandial:  Evening:  Before meals: Taking Insulin?: no  Long acting insulin:  Short acting insulin: Blood Pressure Monitoring: not checking Retinal Examination: Up To Date -- [facility] Foot Exam: Up to Date Pneumovax: Up To Date Influenza: Up to Date Aspirin: no   HYPERLIPIDEMIA No current ACE or ARB due to stable BP.  Continues on Rosuvastatin and Omega 3. Continues Vitamin D supplement daily. Hyperlipidemia status: good compliance Satisfied with current treatment?  yes Side effects:  no Medication compliance: good compliance Supplements: fish oil Aspirin:  no The ASCVD Risk score (Arnett DK, et al., 2019) failed to calculate for the following reasons:   The valid total cholesterol range is 130 to 320 mg/dL Chest pain:  no Coronary artery disease:  no Family history CAD:  yes Family history early CAD:  no   BREAST PAIN One episode one month ago to right axillary upper breast area.  No more episodes.  She felt no  masses at time. Duration :weeks Location: right Onset: sudden Severity: 5/10 Quality: dull, aching, and throbbing Frequency: rare Redness: no Swelling: no Trauma: no trauma Breastfeeding: no Associated with menstral cycle:  hysterectomy Nipple discharge: no Breast lump: no Status: better Treatments attempted: rice sock which helped Previous mammogram: yes 11/22/22 and normal.  Only one abnormal in past on 02/10/2019.   DEPRESSION Continues Celexa 40 MG daily + Buspar 10 MG BID - caregiver to mother with dementia.  Trazodone every night for sleep + Gabapentin for hot flashes.   Mood status: ongoing due to caregiver status Satisfied with current treatment?: yes Symptom severity: moderate  Duration of current treatment : chronic Side effects: no Medication compliance: good compliance Psychotherapy/counseling: none Depressed mood: yes Anxious mood: yes Anhedonia: no Significant weight loss or gain: no Insomnia: yes hard to fall asleep Fatigue: yes Feelings of worthlessness or guilt: yes Impaired concentration/indecisiveness: yes Suicidal ideations: no  Hopelessness: yes Crying spells: no    08/08/2023    8:55 AM 05/08/2023    9:38 AM 03/13/2023    3:09 PM 02/06/2023    9:15 AM 01/03/2023   10:58 AM  Depression screen PHQ 2/9  Decreased Interest 2 2 2 3 2   Down, Depressed, Hopeless 1 2 1 2 1   PHQ - 2 Score 3 4 3 5 3   Altered sleeping 3 2 3 2 2   Tired, decreased energy 2 2 2  2  2  Change in appetite 1 2 2 2 2   Feeling bad or failure about yourself  2 2 2 2 3   Trouble concentrating 2 0 2 2 3   Moving slowly or fidgety/restless 1 0 2 1 0  Suicidal thoughts 0 0 1 1 0  PHQ-9 Score 14 12 17 17 15   Difficult doing work/chores Somewhat difficult Somewhat difficult Somewhat difficult Somewhat difficult Somewhat difficult       08/08/2023    8:55 AM 05/08/2023    9:39 AM 03/13/2023    3:10 PM 02/06/2023    9:15 AM  GAD 7 : Generalized Anxiety Score  Nervous, Anxious, on Edge 1 3  2 2   Control/stop worrying 3 2 2 2   Worry too much - different things 3 2 2 2   Trouble relaxing 2 2 3 2   Restless 1 1 2 2   Easily annoyed or irritable 2 2 2 2   Afraid - awful might happen 2 0 2 2  Total GAD 7 Score 14 12 15 14   Anxiety Difficulty Somewhat difficult Somewhat difficult Somewhat difficult Somewhat difficult   Relevant past medical, surgical, family and social history reviewed and updated as indicated. Interim medical history since our last visit reviewed. Allergies and medications reviewed and updated.  Review of Systems  Constitutional:  Negative for activity change, appetite change, diaphoresis, fatigue and fever.  Respiratory:  Negative for cough, chest tightness and shortness of breath.   Cardiovascular:  Negative for chest pain, palpitations and leg swelling.  Gastrointestinal: Negative.   Endocrine: Negative for polydipsia, polyphagia and polyuria.  Neurological: Negative.   Psychiatric/Behavioral:  Positive for sleep disturbance. Negative for decreased concentration, self-injury and suicidal ideas. The patient is nervous/anxious.    Per HPI unless specifically indicated above     Objective:    BP 132/69   Pulse 65   Temp 98 F (36.7 C) (Oral)   Ht 5' 2.8" (1.595 m)   Wt 155 lb 9.6 oz (70.6 kg)   SpO2 97%   BMI 27.74 kg/m   Wt Readings from Last 3 Encounters:  08/08/23 155 lb 9.6 oz (70.6 kg)  06/13/23 158 lb 6.4 oz (71.8 kg)  05/08/23 153 lb (69.4 kg)    Physical Exam Vitals and nursing note reviewed. Exam conducted with a chaperone present.  Constitutional:      General: She is awake. She is not in acute distress.    Appearance: She is well-developed. She is not ill-appearing.  HENT:     Head: Normocephalic.     Right Ear: Hearing normal.     Left Ear: Hearing normal.  Eyes:     General: Lids are normal.        Right eye: No discharge.        Left eye: No discharge.     Conjunctiva/sclera: Conjunctivae normal.     Pupils: Pupils are equal,  round, and reactive to light.  Neck:     Vascular: No carotid bruit.  Cardiovascular:     Rate and Rhythm: Normal rate and regular rhythm.     Heart sounds: Normal heart sounds. No murmur heard.    No gallop.  Pulmonary:     Effort: Pulmonary effort is normal. No accessory muscle usage or respiratory distress.     Breath sounds: Normal breath sounds.  Chest:  Breasts:    Right: Normal.     Left: Normal.  Abdominal:     General: Bowel sounds are normal.     Palpations: Abdomen  is soft.  Musculoskeletal:     Cervical back: Normal range of motion and neck supple.     Right lower leg: No edema.     Left lower leg: No edema.  Lymphadenopathy:     Upper Body:     Right upper body: No supraclavicular, axillary or pectoral adenopathy.  Skin:    General: Skin is warm and dry.  Neurological:     Mental Status: She is alert and oriented to person, place, and time.  Psychiatric:        Attention and Perception: Attention normal.        Mood and Affect: Mood normal.        Behavior: Behavior normal. Behavior is cooperative.        Thought Content: Thought content normal.        Judgment: Judgment normal.    Diabetic Foot Exam - Simple   Simple Foot Form Visual Inspection See comments: Yes Sensation Testing Intact to touch and monofilament testing bilaterally: Yes Pulse Check Posterior Tibialis and Dorsalis pulse intact bilaterally: Yes Comments Dry skin to both feet.     Results for orders placed or performed in visit on 08/08/23  Bayer DCA Hb A1c Waived  Result Value Ref Range   HB A1C (BAYER DCA - WAIVED) 6.3 (H) 4.8 - 5.6 %      Assessment & Plan:   Problem List Items Addressed This Visit       Cardiovascular and Mediastinum   Hot flashes due to menopause    Chronic, stable.  Improved with Celexa and Gabapentin.  Continue current regimen and adjust as needed.        Endocrine   Hyperlipidemia associated with type 2 diabetes mellitus (HCC)    Chronic, ongoing.   Repeat lipid panel today and focus on diet.  Continue Lovaza and Crestor at this time.      Relevant Medications   Semaglutide,0.25 or 0.5MG /DOS, (OZEMPIC, 0.25 OR 0.5 MG/DOSE,) 2 MG/3ML SOPN   Other Relevant Orders   Bayer DCA Hb A1c Waived (Completed)   Comprehensive metabolic panel   Lipid Panel w/o Chol/HDL Ratio   Type 2 diabetes mellitus with hyperglycemia [facility]) - Primary    Diagnosed August 2023.  A1c in May 6.3% today, remaining stable.  Urine ALB 150 February 2024 -- no current ACE or ARB, may benefit from this in future -- will discuss further next visit, trying to maintain minimal medication per patient wishes.  Is tolerating Ozempic at this time, have advised her I consistent lows, then reduce to 0.25 MG as it is offering benefit and she is losing weight. Educated her on this and side effects to inform provider of.  Continue to monitor blood sugar at home daily and document.  Continue focus on diabetic diet.   - Need ACE or ARB, is on statin - Vaccinations up to date. - Foot and eye exams up to date.      Relevant Medications   Semaglutide,0.25 or 0.5MG /DOS, (OZEMPIC, 0.25 OR 0.5 MG/DOSE,) 2 MG/3ML SOPN   Other Relevant Orders   Bayer DCA Hb A1c Waived (Completed)     Hematopoietic and Hemostatic   Thrombocytopenia (HCC)    Noticed on past labs, mild.  Recheck today and monitor closely.      Relevant Orders   CBC with Differential/Platelet     Other   B12 deficiency    Chronic, improved.  Taking B12 injections with improvement -- continue this and check level next visit.  Breast pain, right    One episode weeks back and no further.  No masses noted.  Recent mammogram stable.  Discussed with patient options, decided that if return of pain or consistent pain or if any masses present then will get imaging.      Generalized anxiety disorder    Chronic, ongoing  Caregiver to her mother who has dementia.  Denies SI/HI.  Continue Celexa 40 MG daily and Buspar to 10 MG  BID.  Continue Trazodone PRN 25-50 MG at HS for short period for sleep needs. Discussed various methods of meditation and relaxation techniques at home.  Recommend she speak to [facility] about Respite Care and looking into hospice caregiver support group.        Vitamin D deficiency    Chronic, ongoing.  Continue supplement and recheck Vitamin D level today.  May consider return to Vit D weekly if on lower side.      Relevant Orders   VITAMIN D 25 Hydroxy (Vit-D Deficiency, Fractures)   Other Visit Diagnoses     Flu vaccine need       Flu vaccine today, educated patient.   Relevant Orders   Flu vaccine trivalent PF, 6mos and older(Flulaval,Afluria,Fluarix,Fluzone) (Completed)        Follow up plan: Return in about 3 months (around 11/05/2023) for Annual Physical after 11/03/23.

## 2023-08-08 NOTE — Assessment & Plan Note (Signed)
Chronic, ongoing.  Repeat lipid panel today and focus on diet.  Continue Lovaza and Crestor at this time.

## 2023-08-08 NOTE — Assessment & Plan Note (Signed)
Chronic, ongoing.  Continue supplement and recheck Vitamin D level today.  May consider return to Vit D weekly if on lower side.

## 2023-08-08 NOTE — Assessment & Plan Note (Signed)
Noticed on past labs, mild.  Recheck today and monitor closely.

## 2023-08-08 NOTE — Assessment & Plan Note (Signed)
Chronic, stable.  Improved with Celexa and Gabapentin.  Continue current regimen and adjust as needed.

## 2023-08-08 NOTE — Assessment & Plan Note (Signed)
Chronic, improved.  Taking B12 injections with improvement -- continue this and check level next visit.

## 2023-08-08 NOTE — Assessment & Plan Note (Signed)
One episode weeks back and no further.  No masses noted.  Recent mammogram stable.  Discussed with patient options, decided that if return of pain or consistent pain or if any masses present then will get imaging.

## 2023-08-08 NOTE — Assessment & Plan Note (Addendum)
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  Denies SI/HI.  Continue Celexa 40 MG daily and Buspar to 10 MG BID.  Continue Trazodone PRN 25-50 MG at HS for short period for sleep needs. Discussed various methods of meditation and relaxation techniques at home.  Recommend she speak to [facility] about Respite Care and looking into hospice caregiver support group.

## 2023-08-08 NOTE — Assessment & Plan Note (Signed)
Diagnosed August 2023.  A1c in May 6.3% today, remaining stable.  Urine ALB 150 February 2024 -- no current ACE or ARB, may benefit from this in future -- will discuss further next visit, trying to maintain minimal medication per patient wishes.  Is tolerating Ozempic at this time, have advised her I consistent lows, then reduce to 0.25 MG as it is offering benefit and she is losing weight. Educated her on this and side effects to inform provider of.  Continue to monitor blood sugar at home daily and document.  Continue focus on diabetic diet.   - Need ACE or ARB, is on statin - Vaccinations up to date. - Foot and eye exams up to date.

## 2023-08-09 ENCOUNTER — Other Ambulatory Visit: Payer: Self-pay | Admitting: Nurse Practitioner

## 2023-08-09 LAB — COMPREHENSIVE METABOLIC PANEL
ALT: 21 [IU]/L (ref 0–32)
AST: 21 [IU]/L (ref 0–40)
Albumin: 4.5 g/dL (ref 3.9–4.9)
Alkaline Phosphatase: 102 [IU]/L (ref 44–121)
BUN/Creatinine Ratio: 10 — ABNORMAL LOW (ref 12–28)
BUN: 10 mg/dL (ref 8–27)
Bilirubin Total: 0.3 mg/dL (ref 0.0–1.2)
CO2: 21 mmol/L (ref 20–29)
Calcium: 9.3 mg/dL (ref 8.7–10.3)
Chloride: 105 mmol/L (ref 96–106)
Creatinine, Ser: 1 mg/dL (ref 0.57–1.00)
Globulin, Total: 2.3 g/dL (ref 1.5–4.5)
Glucose: 92 mg/dL (ref 70–99)
Potassium: 4.3 mmol/L (ref 3.5–5.2)
Sodium: 142 mmol/L (ref 134–144)
Total Protein: 6.8 g/dL (ref 6.0–8.5)
eGFR: 64 mL/min/{1.73_m2} (ref >59)

## 2023-08-09 LAB — CBC WITH DIFFERENTIAL/PLATELET
Basophils Absolute: 0.1 10*3/uL (ref 0.0–0.2)
Basos: 1 %
EOS (ABSOLUTE): 0.1 10*3/uL (ref 0.0–0.4)
Eos: 2 %
Hematocrit: 40.1 % (ref 34.0–46.6)
Hemoglobin: 13.5 g/dL (ref 11.1–15.9)
Immature Grans (Abs): 0 10*3/uL (ref 0.0–0.1)
Immature Granulocytes: 0 %
Lymphocytes Absolute: 1.4 10*3/uL (ref 0.7–3.1)
Lymphs: 28 %
MCH: 30.6 pg (ref 26.6–33.0)
MCHC: 33.7 g/dL (ref 31.5–35.7)
MCV: 91 fL (ref 79–97)
Monocytes Absolute: 0.4 10*3/uL (ref 0.1–0.9)
Monocytes: 8 %
Neutrophils Absolute: 3 10*3/uL (ref 1.4–7.0)
Neutrophils: 61 %
Platelets: 145 10*3/uL — ABNORMAL LOW (ref 150–450)
RBC: 4.41 x10E6/uL (ref 3.77–5.28)
RDW: 12.8 % (ref 11.7–15.4)
WBC: 4.9 10*3/uL (ref 3.4–10.8)

## 2023-08-09 LAB — VITAMIN D 25 HYDROXY (VIT D DEFICIENCY, FRACTURES): Vit D, 25-Hydroxy: 59 ng/mL (ref 30.0–100.0)

## 2023-08-09 LAB — LIPID PANEL W/O CHOL/HDL RATIO
Cholesterol, Total: 108 mg/dL (ref 100–199)
HDL: 44 mg/dL (ref >39)
LDL Chol Calc (NIH): 26 mg/dL (ref 0–99)
Triglycerides: 250 mg/dL — ABNORMAL HIGH (ref 0–149)
VLDL Cholesterol Cal: 38 mg/dL (ref 5–40)

## 2023-08-11 NOTE — Progress Notes (Signed)
Contacted via MyChart   Good morning Karen Lambert, your labs have returned: - Kidney function, creatinine and eGFR, remains normal, as is liver function, AST and ALT.  - Cholesterol levels show stable LDL, but triglycerides remain a little elevated. Continue Rosuvastatin and diet focus. - Platelets remain a little low, but in stable range for you.  Any questions? Keep being amazing!!  Thank you for allowing me to participate in your care.  I appreciate you. Kindest regards, Karen Lambert

## 2023-08-13 ENCOUNTER — Ambulatory Visit: Payer: Managed Care, Other (non HMO) | Admitting: Nurse Practitioner

## 2023-08-13 NOTE — Telephone Encounter (Signed)
D/C 12/06/22. Requested Prescriptions  Signed Prescriptions Disp Refills   gabapentin (NEURONTIN) 600 MG tablet 45 tablet 2    Sig: TAKE 1/2 TABLET(300 MG) BY MOUTH AT BEDTIME     Neurology: Anticonvulsants - gabapentin Passed - 08/09/2023  7:03 AM      Passed - Cr in normal range and within 360 days    Creatinine, Ser  Date Value Ref Range Status  08/08/2023 1.00 0.57 - 1.00 mg/dL Final         Passed - Completed PHQ-2 or PHQ-9 in the last 360 days      Passed - Valid encounter within last 12 months    Recent Outpatient Visits           5 days ago Type 2 diabetes mellitus with hyperglycemia, without long-term current use of insulin (HCC)   [place] [facility] [place], Holden T, NP   3 months ago Type 2 diabetes mellitus with hyperglycemia, without long-term current use of insulin (HCC)   [place] [facility] [place], [place] T, NP   4 months ago Type 2 diabetes mellitus with hyperglycemia, without long-term current use of insulin (HCC)   [place] [facility] [place], Buford T, NP   5 months ago Gastroenteritis   [place] [facility] Mecum, Erin E, PA-C   6 months ago Type 2 diabetes mellitus with hyperglycemia, without long-term current use of insulin (HCC)   [place] [facility] [place], Dorie Rank, NP       Future Appointments             In 2 months Cannady, Dorie Rank, NP [facility], [facility]   In 5 months Willeen Niece, MD [facility]            Refused Prescriptions Disp Refills   busPIRone (BUSPAR) 5 MG tablet [Pharmacy Med Name: BUSPIRONE 5MG  TABLETS] 180 tablet 4    Sig: TAKE 1 TABLET(5 MG) BY MOUTH TWICE DAILY     Psychiatry: Anxiolytics/Hypnotics - Non-controlled Passed - 08/09/2023  7:03 AM      Passed - Valid encounter within last 12 months    Recent Outpatient Visits           5 days ago Type 2 diabetes mellitus with hyperglycemia,  without long-term current use of insulin (HCC)   [place] [facility] [place], [place] T, NP   3 months ago Type 2 diabetes mellitus with hyperglycemia, without long-term current use of insulin (HCC)   [place] [facility] [place], Rafael Gonzalez T, NP   4 months ago Type 2 diabetes mellitus with hyperglycemia, without long-term current use of insulin (HCC)   [place] [facility] [place], [place] T, NP   5 months ago Gastroenteritis   [place] [facility] Mecum, Erin E, PA-C   6 months ago Type 2 diabetes mellitus with hyperglycemia, without long-term current use of insulin (HCC)   [place] [facility] [place], Dorie Rank, NP       Future Appointments             In 2 months Cannady, Dorie Rank, NP [place] [facility], [facility]   In 5 months Willeen Niece, MD [facility]

## 2023-08-13 NOTE — Telephone Encounter (Signed)
Requested Prescriptions  Pending Prescriptions Disp Refills   busPIRone (BUSPAR) 5 MG tablet [Pharmacy Med Name: BUSPIRONE 5MG  TABLETS] 180 tablet 4    Sig: TAKE 1 TABLET(5 MG) BY MOUTH TWICE DAILY     Psychiatry: Anxiolytics/Hypnotics - Non-controlled Passed - 08/09/2023  7:03 AM      Passed - Valid encounter within last 12 months    Recent Outpatient Visits           5 days ago Type 2 diabetes mellitus with hyperglycemia, without long-term current use of insulin (HCC)   [facility] [place], [place] T, NP   3 months ago Type 2 diabetes mellitus with hyperglycemia, without long-term current use of insulin (HCC)   [place] [facility] [place], Dixie T, NP   4 months ago Type 2 diabetes mellitus with hyperglycemia, without long-term current use of insulin (HCC)   [place] [facility] [place], Draper T, NP   5 months ago Gastroenteritis   [place] [facility] Mecum, Erin E, PA-C   6 months ago Type 2 diabetes mellitus with hyperglycemia, without long-term current use of insulin (HCC)   [facility] [place], Corrie Dandy T, NP       Future Appointments             In 2 months Cannady, Dorie Rank, NP [place] [facility], [facility]   In 5 months Willeen Niece, MD [place] [facility]             gabapentin (NEURONTIN) 600 MG tablet [Pharmacy Med Name: GABAPENTIN 600MG  TABLETS] 45 tablet 2    Sig: TAKE 1/2 TABLET(300 MG) BY MOUTH AT BEDTIME     Neurology: Anticonvulsants - gabapentin Passed - 08/09/2023  7:03 AM      Passed - Cr in normal range and within 360 days    Creatinine, Ser  Date Value Ref Range Status  08/08/2023 1.00 0.57 - 1.00 mg/dL Final         Passed - Completed PHQ-2 or PHQ-9 in the last 360 days      Passed - Valid encounter within last 12 months    Recent Outpatient Visits           5 days ago Type 2 diabetes mellitus with hyperglycemia,  without long-term current use of insulin (HCC)   [place] [facility] [place], A[place] T, NP   3 months ago Type 2 diabetes mellitus with hyperglycemia, without long-term current use of insulin (HCC)   [facility] [place], [place] T, NP   4 months ago Type 2 diabetes mellitus with hyperglycemia, without long-term current use of insulin (HCC)   [place] [facility] [place], [place] T, NP   5 months ago Gastroenteritis   [place] [facility] Mecum, Erin E, PA-C   6 months ago Type 2 diabetes mellitus with hyperglycemia, without long-term current use of insulin (HCC)    [place], Dorie Rank, NP       Future Appointments             In 2 months Cannady, Dorie Rank, NP  [facility], [facility]   In 5 months Willeen Niece, MD [facility]

## 2023-08-15 ENCOUNTER — Encounter: Payer: Self-pay | Admitting: Nurse Practitioner

## 2023-08-16 NOTE — Telephone Encounter (Signed)
Called patient regarding her mother, set her up an appointment on 08/17/2023 @ 11:20 am via [employer] video.

## 2023-08-22 ENCOUNTER — Encounter: Payer: Self-pay | Admitting: Nurse Practitioner

## 2023-08-24 ENCOUNTER — Encounter: Payer: Self-pay | Admitting: Nurse Practitioner

## 2023-08-29 ENCOUNTER — Telehealth: Payer: Managed Care, Other (non HMO) | Admitting: Nurse Practitioner

## 2023-09-26 ENCOUNTER — Encounter: Payer: Self-pay | Admitting: Nurse Practitioner

## 2023-10-08 ENCOUNTER — Telehealth: Payer: Managed Care, Other (non HMO) | Admitting: Emergency Medicine

## 2023-10-08 DIAGNOSIS — J40 Bronchitis, not specified as acute or chronic: Secondary | ICD-10-CM | POA: Diagnosis not present

## 2023-10-08 MED ORDER — AZITHROMYCIN 250 MG PO TABS: ORAL_TABLET | ORAL | 0 refills | Status: AC

## 2023-10-08 NOTE — Progress Notes (Signed)
E-Visit for Cough   We are sorry that you are not feeling well.  Here is how we plan to help!  *If this treatment plan does not help you, please be checked in person  Based on your presentation I believe you most likely have A cough due to bacteria.  When patients have a fever and a productive cough with a change in color or increased sputum production, we are concerned about bacterial bronchitis.  If left untreated it can progress to pneumonia.  If your symptoms do not improve with your treatment plan it is important that you contact your provider.   I have prescribed Azithromyin 250 mg: two tablets now and then one tablet daily for 4 additonal days    In addition you may use A non-prescription cough medication called Mucinex DM: take 2 tablets every 12 hours.   From your responses in the eVisit questionnaire you describe inflammation in the upper respiratory tract which is causing a significant cough.  This is commonly called Bronchitis and has four common causes:   Allergies Viral Infections Acid Reflux Bacterial Infection Allergies, viruses and acid reflux are treated by controlling symptoms or eliminating the cause. An example might be a cough caused by taking certain blood pressure medications. You stop the cough by changing the medication. Another example might be a cough caused by acid reflux. Controlling the reflux helps control the cough.  USE OF BRONCHODILATOR ("RESCUE") INHALERS: There is a risk from using your bronchodilator too frequently.  The risk is that over-reliance on a medication which only relaxes the muscles surrounding the breathing tubes can reduce the effectiveness of medications prescribed to reduce swelling and congestion of the tubes themselves.  Although you feel brief relief from the bronchodilator inhaler, your asthma may actually be worsening with the tubes becoming more swollen and filled with mucus.  This can delay other crucial treatments, such as oral steroid  medications. If you need to use a bronchodilator inhaler daily, several times per day, you should discuss this with your provider.  There are probably better treatments that could be used to keep your asthma under control.     HOME CARE Only take medications as instructed by your medical team. Complete the entire course of an antibiotic. Drink plenty of fluids and get plenty of rest. Avoid close contacts especially the very young and the elderly Cover your mouth if you cough or cough into your sleeve. Always remember to wash your hands A steam or ultrasonic humidifier can help congestion.   GET HELP RIGHT AWAY IF: You develop worsening fever. You become short of breath You cough up blood. Your symptoms persist after you have completed your treatment plan MAKE SURE YOU  Understand these instructions. Will watch your condition. Will get help right away if you are not doing well or get worse.    Thank you for choosing an e-visit.  Your e-visit answers were reviewed by a board certified advanced clinical practitioner to complete your personal care plan. Depending upon the condition, your plan could have included both over the counter or prescription medications.  Please review your pharmacy choice. Make sure the pharmacy is open so you can pick up prescription now. If there is a problem, you may contact your provider through [employer] and have the prescription routed to another pharmacy.  Your safety is important to [place]. If you have drug allergies check your prescription carefully.   For the next 24 hours you can use MyChart to ask  questions about today's visit, request a non-urgent call back, or ask for a work or school excuse. You will get an email in the next two days asking about your experience. I hope that your e-visit has been valuable and will speed your recovery.  I have spent 5 minutes in review of e-visit questionnaire, review and updating patient chart, medical decision  making and response to patient.   Rica Mast, PhD, FNP-BC

## 2023-10-22 ENCOUNTER — Other Ambulatory Visit: Payer: Self-pay | Admitting: Nurse Practitioner

## 2023-10-22 DIAGNOSIS — Z1231 Encounter for screening mammogram for malignant neoplasm of breast: Secondary | ICD-10-CM

## 2023-11-03 NOTE — Patient Instructions (Signed)
 Be Involved in Caring For Your Health:  Taking Medications When medications are taken as directed, they can greatly improve your health. But if they are not taken as prescribed, they may not work. In some cases, not taking them correctly can be harmful. To help ensure your treatment remains effective and safe, understand your medications and how to take them. Bring your medications to each visit for review by your provider.  Your lab results, notes, and after visit summary will be available on My Chart. We strongly encourage you to use this feature. If lab results are abnormal the clinic will contact you with the appropriate steps. If the clinic does not contact you assume the results are satisfactory. You can always view your results on My Chart. If you have questions regarding your health or results, please contact the clinic during office hours. You can also ask questions on My Chart.  We at [facility] are grateful that you chose [place] to provide your care. We strive to provide evidence-based and compassionate care and are always looking for feedback. If you get a survey from the clinic please complete this so we can hear your opinions.  Diabetes Mellitus and Foot Care Diabetes, also called diabetes mellitus, may cause problems with your feet and legs because of poor blood flow (circulation). Poor circulation may make your skin: Become thinner and drier. Break more easily. Heal more slowly. Peel and crack. You may also have nerve damage (neuropathy). This can cause decreased feeling in your legs and feet. This means that you may not notice minor injuries to your feet that could lead to more serious problems. Finding and treating problems early is the best way to prevent future foot problems. How to care for your feet Foot hygiene  Wash your feet daily with warm water and mild soap. Do not use hot water. Then, pat your feet and the areas between your toes until they are fully dry. Do  not soak your feet. This can dry your skin. Trim your toenails straight across. Do not dig under them or around the cuticle. File the edges of your nails with an emery board or nail file. Apply a moisturizing lotion or petroleum jelly to the skin on your feet and to dry, brittle toenails. Use lotion that does not contain alcohol and is unscented. Do not apply lotion between your toes. Shoes and socks Wear clean socks or stockings every day. Make sure they are not too tight. Do not wear knee-high stockings. These may decrease blood flow to your legs. Wear shoes that fit well and have enough cushioning. Always look in your shoes before you put them on to be sure there are no objects inside. To break in new shoes, wear them for just a few hours a day. This prevents injuries on your feet. Wounds, scrapes, corns, and calluses  Check your feet daily for blisters, cuts, bruises, sores, and redness. If you cannot see the bottom of your feet, use a mirror or ask someone for help. Do not cut off corns or calluses or try to remove them with medicine. If you find a minor scrape, cut, or break in the skin on your feet, keep it and the skin around it clean and dry. You may clean these areas with mild soap and water. Do not clean the area with peroxide, alcohol, or iodine. If you have a wound, scrape, corn, or callus on your foot, look at it several times a day to make sure it  is healing and not infected. Check for: Redness, swelling, or pain. Fluid or blood. Warmth. Pus or a bad smell. General tips Do not cross your legs. This may decrease blood flow to your feet. Do not use heating pads or hot water bottles on your feet. They may burn your skin. If you have lost feeling in your feet or legs, you may not know this is happening until it is too late. Protect your feet from hot and cold by wearing shoes, such as at the beach or on hot pavement. Schedule a complete foot exam at least once a year or more often if  you have foot problems. Report any cuts, sores, or bruises to your health care provider right away. Where to find more information American Diabetes Association: diabetes.org Association of Diabetes Care & Education Specialists: diabeteseducator.org Contact a health care provider if: You have a condition that increases your risk of infection, and you have any cuts, sores, or bruises on your feet. You have an injury that is not healing. You have redness on your legs or feet. You feel burning or tingling in your legs or feet. You have pain or cramps in your legs and feet. Your legs or feet are numb. Your feet always feel cold. You have pain around any toenails. Get help right away if: You have a wound, scrape, corn, or callus on your foot and: You have signs of infection. You have a fever. You have a red line going up your leg. This information is not intended to replace advice given to you by your health care provider. Make sure you discuss any questions you have with your health care provider. Document Revised: 03/01/2022 Document Reviewed: 03/01/2022 Elsevier Patient Education  2024 [employer].

## 2023-11-05 ENCOUNTER — Encounter: Payer: Self-pay | Admitting: Nurse Practitioner

## 2023-11-05 ENCOUNTER — Ambulatory Visit (INDEPENDENT_AMBULATORY_CARE_PROVIDER_SITE_OTHER): Payer: Managed Care, Other (non HMO) | Admitting: Nurse Practitioner

## 2023-11-05 VITALS — BP 125/73 | HR 70 | Temp 98.0°F | Ht 62.2 in | Wt 155.0 lb

## 2023-11-05 DIAGNOSIS — E1165 Type 2 diabetes mellitus with hyperglycemia: Secondary | ICD-10-CM | POA: Diagnosis not present

## 2023-11-05 DIAGNOSIS — E538 Deficiency of other specified B group vitamins: Secondary | ICD-10-CM

## 2023-11-05 DIAGNOSIS — Z0000 Encounter for general adult medical examination without abnormal findings: Secondary | ICD-10-CM | POA: Diagnosis not present

## 2023-11-05 DIAGNOSIS — E785 Hyperlipidemia, unspecified: Secondary | ICD-10-CM

## 2023-11-05 DIAGNOSIS — E1169 Type 2 diabetes mellitus with other specified complication: Secondary | ICD-10-CM | POA: Diagnosis not present

## 2023-11-05 DIAGNOSIS — E559 Vitamin D deficiency, unspecified: Secondary | ICD-10-CM

## 2023-11-05 DIAGNOSIS — N951 Menopausal and female climacteric states: Secondary | ICD-10-CM

## 2023-11-05 DIAGNOSIS — F411 Generalized anxiety disorder: Secondary | ICD-10-CM

## 2023-11-05 DIAGNOSIS — D696 Thrombocytopenia, unspecified: Secondary | ICD-10-CM

## 2023-11-05 DIAGNOSIS — N3281 Overactive bladder: Secondary | ICD-10-CM

## 2023-11-05 DIAGNOSIS — J301 Allergic rhinitis due to pollen: Secondary | ICD-10-CM

## 2023-11-05 LAB — BAYER DCA HB A1C WAIVED: HB A1C (BAYER DCA - WAIVED): 6.1 % — ABNORMAL HIGH (ref 4.8–5.6)

## 2023-11-05 LAB — MICROALBUMIN, URINE WAIVED
Creatinine, Urine Waived: 200 mg/dL (ref 10–300)
Microalb, Ur Waived: 80 mg/L — ABNORMAL HIGH (ref 0–19)

## 2023-11-05 MED ORDER — TRAZODONE HCL 100 MG PO TABS: 100.0000 mg | ORAL_TABLET | Freq: Every evening | ORAL | 1 refills | Status: DC | PRN

## 2023-11-05 MED ORDER — CYANOCOBALAMIN 1000 MCG/ML IJ SOLN
1000.0000 ug | Freq: Once | INTRAMUSCULAR | Status: AC
Start: 2023-11-05 — End: 2023-11-05
  Administered 2023-11-05: 1000 ug via INTRAMUSCULAR

## 2023-11-05 NOTE — Assessment & Plan Note (Addendum)
 Stable.  Recheck labs today. Continue to monitor.

## 2023-11-05 NOTE — Assessment & Plan Note (Signed)
 Chronic, ongoing. Labs checked today. Continue supplement at this time.

## 2023-11-05 NOTE — Assessment & Plan Note (Signed)
 Chronic, ongoing. 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 Mother placed in Memory [facility] Facility in January. Continue Celexa 40 mg daily and Buspar 10 mg BID. Increase Trazadone to 100 mg at HS for continued sleep issues.

## 2023-11-05 NOTE — Assessment & Plan Note (Signed)
 Chronic, ongoing. Continue Ditropan daily, continues to offer her benefit.

## 2023-11-05 NOTE — Progress Notes (Deleted)
 BP 125/73   Pulse 70   Temp 98 F (36.7 C) (Oral)   Ht 5' 2.2" (1.58 m)   Wt 155 lb (70.3 kg)   SpO2 98%   BMI 28.17 kg/m    Subjective:    Patient ID: Karen Lambert, female    DOB: 01/23/1961, 63 y.o.   MRN: [id number]  HPI: SHERON TALLMAN is a 63 y.o. female presenting on 11/05/2023 for comprehensive medical examination. Current medical complaints include:none  She currently lives with: family Menopausal Symptoms: no  Continues to take Ditropan XL for OAB and stress incontinence with benefit.  DIABETES Continues on Ozempic.  Takes Vitamin D and B12 supplements for history of low levels. History of low platelets on labs, recent stable. Hypoglycemic episodes:{Blank single:19197::"yes","no"} Polydipsia/polyuria: {Blank single:19197::"yes","no"} Visual disturbance: {Blank single:19197::"yes","no"} Chest pain: {Blank single:19197::"yes","no"} Paresthesias: {Blank single:19197::"yes","no"} Glucose Monitoring: {Blank single:19197::"yes","no"}  Accucheck frequency: {Blank single:19197::"Not Checking","Daily","BID","TID"}  Fasting glucose:  Post prandial:  Evening:  Before meals: Taking Insulin?: {Blank single:19197::"yes","no"}  Long acting insulin:  Short acting insulin: Blood Pressure Monitoring: {Blank single:19197::"not checking","rarely","daily","weekly","monthly","a few times a day","a few times a week","a few times a month"} Retinal Examination: {Blank single:19197::"Up to Date","Not up to Date"} Foot Exam: {Blank single:19197::"Up to Date","Not up to Date"} Diabetic Education: {Blank single:19197::"Completed","Not Completed"} Pneumovax: {Blank single:19197::"Up to Date","Not up to Date","unknown"} Influenza: {Blank single:19197::"Up to Date","Not up to Date","unknown"} Aspirin: {Blank single:19197::"yes","no"}   HYPERTENSION / HYPERLIPIDEMIA Takes Rosuvastatin and Lovaza.  No current BP medications. Satisfied with current treatment? {Blank  single:19197::"yes","no"} Duration of hypertension: {Blank single:19197::"chronic","months","years"} BP monitoring frequency: {Blank single:19197::"not checking","rarely","daily","weekly","monthly","a few times a day","a few times a week","a few times a month"} BP range:  BP medication side effects: {Blank single:19197::"yes","no"} Duration of hyperlipidemia: {Blank single:19197::"chronic","months","years"} Cholesterol medication side effects: {Blank single:19197::"yes","no"} Cholesterol supplements: {Blank multiple:19196::"none","fish oil","niacin","red yeast rice"} Medication compliance: {Blank single:19197::"excellent compliance","good compliance","fair compliance","poor compliance"} Aspirin: {Blank single:19197::"yes","no"} Recent stressors: {Blank single:19197::"yes","no"} Recurrent headaches: {Blank single:19197::"yes","no"} Visual changes: {Blank single:19197::"yes","no"} Palpitations: {Blank single:19197::"yes","no"} Dyspnea: {Blank single:19197::"yes","no"} Chest pain: {Blank single:19197::"yes","no"} Lower extremity edema: {Blank single:19197::"yes","no"} Dizzy/lightheaded: {Blank single:19197::"yes","no"}   DEPRESSION Continues on Celexa and Buspar.  Gabapentin for menopausal symptoms.  Trazodone for sleep. Mood status: {Blank single:19197::"controlled","uncontrolled","better","worse","exacerbated","stable"} Satisfied with current treatment?: {Blank single:19197::"yes","no"} Symptom severity: {Blank single:19197::"mild","moderate","severe"}  Duration of current treatment : {Blank single:19197::"chronic","months","years"} Side effects: {Blank single:19197::"yes","no"} Medication compliance: {Blank single:19197::"excellent compliance","good compliance","fair compliance","poor compliance"} Psychotherapy/counseling: {Blank single:19197::"yes","no"} {Blank single:19197::"current","in the past"} Depressed mood: {Blank single:19197::"yes","no"} Anxious mood: {Blank  single:19197::"yes","no"} Anhedonia: {Blank single:19197::"yes","no"} Significant weight loss or gain: {Blank single:19197::"yes","no"} Insomnia: {Blank single:19197::"yes","no"} {Blank single:19197::"hard to fall asleep","hard to stay asleep"} Fatigue: {Blank single:19197::"yes","no"} Feelings of worthlessness or guilt: {Blank single:19197::"yes","no"} Impaired concentration/indecisiveness: {Blank single:19197::"yes","no"} Suicidal ideations: {Blank single:19197::"yes","no"} Hopelessness: {Blank single:19197::"yes","no"} Crying spells: {Blank single:19197::"yes","no"}    08/08/2023    8:55 AM 05/08/2023    9:38 AM 03/13/2023    3:09 PM 02/06/2023    9:15 AM 01/03/2023   10:58 AM  Depression screen PHQ 2/9  Decreased Interest 2 2 2 3 2   Down, Depressed, Hopeless 1 2 1 2 1   PHQ - 2 Score 3 4 3 5 3   Altered sleeping 3 2 3 2 2   Tired, decreased energy 2 2 2 2 2   Change in appetite 1 2 2 2 2   Feeling bad or failure about yourself  2 2 2 2 3   Trouble concentrating 2 0 2 2 3   Moving slowly or fidgety/restless 1 0 2 1 0  Suicidal thoughts 0 0 1 1 0  PHQ-9 Score 14 12 17  17 15  Difficult doing work/chores Somewhat difficult Somewhat difficult Somewhat difficult Somewhat difficult Somewhat difficult      08/08/2023    8:55 AM 05/08/2023    9:39 AM 03/13/2023    3:10 PM 02/06/2023    9:15 AM  GAD 7 : Generalized Anxiety Score  Nervous, Anxious, on Edge 1 3 2 2   Control/stop worrying 3 2 2 2   Worry too much - different things 3 2 2 2   Trouble relaxing 2 2 3 2   Restless 1 1 2 2   Easily annoyed or irritable 2 2 2 2   Afraid - awful might happen 2 0 2 2  Total GAD 7 Score 14 12 15 14   Anxiety Difficulty Somewhat difficult Somewhat difficult Somewhat difficult Somewhat difficult        11/02/2022    8:15 AM 12/06/2022    8:49 AM 01/03/2023   10:58 AM 02/06/2023    9:15 AM 08/08/2023    8:55 AM  Fall Risk  Falls in the past year? 0 0 0 0 1  Was there an injury with Fall? 0 0 0 0 0  Fall  Risk Category Calculator 0 0 0 0 1  Patient at Risk for Falls Due to No Fall Risks No Fall Risks No Fall Risks No Fall Risks No Fall Risks  Fall risk Follow up Falls evaluation completed Falls evaluation completed Falls evaluation completed Falls evaluation completed Falls evaluation completed    Functional Status Survey: Is the patient deaf or have difficulty hearing?: No Does the patient have difficulty seeing, even when wearing glasses/contacts?: No Does the patient have difficulty concentrating, remembering, or making decisions?: No Does the patient have difficulty walking or climbing stairs?: No Does the patient have difficulty dressing or bathing?: No Does the patient have difficulty doing errands alone such as visiting a doctor's office or shopping?: No   Past Medical History:  Past Medical History:  Diagnosis Date   Anxiety    Broken ankle    COVID-19 05/10/2023   Symptoms resolved by 05/14/23   Generalized anxiety disorder    Hyperlipidemia    Overactive bladder    Skin cancer 2012   Sleep apnea    CPAP   Type 2 diabetes mellitus (HCC)    Vertigo    Wears dentures    partial upper and lower    Surgical History:  Past Surgical History:  Procedure Laterality Date   BASAL CELL CARCINOMA EXCISION     L shoulder and R upper thigh   CESAREAN SECTION     COLONOSCOPY WITH PROPOFOL N/A 12/20/2015   Procedure: COLONOSCOPY WITH PROPOFOL;  Surgeon: Wallace Cullens, MD;  Location: [facility];  Service: Gastroenterology;  Laterality: N/A;   NASAL SEPTOPLASTY W/ TURBINOPLASTY Bilateral 06/13/2023   Procedure: NASAL SEPTOPLASTY WITH INFERIORTURBINATE REDUCTION;  Surgeon: Bud Face, MD;  Location: [facility];  Service: ENT;  Laterality: Bilateral;  Diabetic   ROTATOR CUFF REPAIR Right 2019   TOTAL ABDOMINAL HYSTERECTOMY  2010    Medications:  Current Outpatient Medications on File Prior to Visit  Medication Sig   Blood Glucose Monitoring Suppl (ONETOUCH VERIO)  w/Device KIT Use to check blood sugar 3 times a day and document results, bring to appointments.  Goal is <130 fasting blood sugar and <180 two hours after meals.   busPIRone (BUSPAR) 10 MG tablet Take 1 tablet (10 mg total) by mouth 2 (two) times daily.   citalopram (CELEXA) 40 MG tablet Take 1 tablet (40 mg  total) by mouth daily.   cyanocobalamin (VITAMIN B12) 1000 MCG/ML injection Inject 1 mL (1,000 mcg total) into the muscle every 30 (thirty) days.   gabapentin (NEURONTIN) 600 MG tablet TAKE 1/2 TABLET(300 MG) BY MOUTH AT BEDTIME   glucose blood test strip Use to check blood sugar 3 times daily   Lancets (ONETOUCH ULTRASOFT) lancets Use to check blood sugar 3 times a day and document results, bring to appointments.  Goal is <130 fasting blood sugar and <180 two hours after meals.   omega-3 acid ethyl esters (LOVAZA) 1 g capsule Take 2 capsules (2 g total) by mouth 2 (two) times daily.   oxybutynin (DITROPAN-XL) 10 MG 24 hr tablet Take 1 tablet (10 mg total) by mouth at bedtime.   rosuvastatin (CRESTOR) 40 MG tablet Take 1 tablet (40 mg total) by mouth daily.   Syringe/Needle, Disp, (SYRINGE 3CC/20GX1-1/2") 20G X 1-1/2" 3 ML MISC Use to inject B12 shot monthly.   traZODone (DESYREL) 50 MG tablet TAKE 1/2 TO 1 TABLET(25 TO 50 MG) BY MOUTH AT BEDTIME AS NEEDED FOR SLEEP   VITAMIN D PO Take by mouth daily. 1000 mg   Semaglutide,0.25 or 0.5MG /DOS, (OZEMPIC, 0.25 OR 0.5 MG/DOSE,) 2 MG/3ML SOPN Inject 0.5 mg into the skin once a week. (Patient not taking: Reported on 11/05/2023)   No current facility-administered medications on file prior to visit.    Allergies:  Allergies  Allergen Reactions   Latex Shortness Of Breath and Rash    Bandaids only    Social History:  Social History   Socioeconomic History   Marital status: Divorced    Spouse name: Not on file   Number of children: Not on file   Years of education: Not on file   Highest education level: Not on file  Occupational History    Not on file  Tobacco Use   Smoking status: Former    Current packs/day: 0.00    Average packs/day: 1.3 packs/day for 15.0 years (18.8 ttl pk-yrs)    Types: Cigarettes    Start date: 48    Quit date: 2008    Years since quitting: 17.1   Smokeless tobacco: Never  Vaping Use   Vaping status: Never Used  Substance and Sexual Activity   Alcohol use: Yes    Alcohol/week: 1.0 standard drink of alcohol    Types: 1 Glasses of wine per week    Comment: occasional   Drug use: Not Currently   Sexual activity: Not Currently  Other Topics Concern   Not on file  Social History Narrative   Not on file   Social Drivers of Health   Financial Resource Strain: Not on file  Food Insecurity: Not on file  Transportation Needs: Not on file  Physical Activity: Not on file  Stress: Not on file  Social Connections: Not on file  Intimate Partner Violence: Not on file   Social History   Tobacco Use  Smoking Status Former   Current packs/day: 0.00   Average packs/day: 1.3 packs/day for 15.0 years (18.8 ttl pk-yrs)   Types: Cigarettes   Start date: 41   Quit date: 2008   Years since quitting: 17.1  Smokeless Tobacco Never   Social History   Substance and Sexual Activity  Alcohol Use Yes   Alcohol/week: 1.0 standard drink of alcohol   Types: 1 Glasses of wine per week   Comment: occasional    Family History:  Family History  Problem Relation Age of Onset   Heart  disease Mother    Diabetes Mother    Cataracts Mother    Dementia Mother    Cancer Father    Diabetes Father    Stroke Father    Lymphoma Brother    Diabetes Brother    Diabetes Brother    Breast cancer Paternal Aunt    Stroke Maternal Grandfather     Past medical history, surgical history, medications, allergies, family history and social history reviewed with patient today and changes made to appropriate areas of the chart.   ROS All other ROS negative except what is listed above and in the HPI.       Objective:    BP 125/73   Pulse 70   Temp 98 F (36.7 C) (Oral)   Ht 5' 2.2" (1.58 m)   Wt 155 lb (70.3 kg)   SpO2 98%   BMI 28.17 kg/m   Wt Readings from Last 3 Encounters:  11/05/23 155 lb (70.3 kg)  08/08/23 155 lb 9.6 oz (70.6 kg)  06/13/23 158 lb 6.4 oz (71.8 kg)    Physical Exam  Results for orders placed or performed in visit on 08/08/23  Bayer DCA Hb A1c Waived   Collection Time: 08/08/23  8:33 AM  Result Value Ref Range   HB A1C (BAYER DCA - WAIVED) 6.3 (H) 4.8 - 5.6 %  Comprehensive metabolic panel   Collection Time: 08/08/23  8:36 AM  Result Value Ref Range   Glucose 92 70 - 99 mg/dL   BUN 10 8 - 27 mg/dL   Creatinine, Ser 1.61 0.57 - 1.00 mg/dL   eGFR 64 >09 UE/AVW/0.98   BUN/Creatinine Ratio 10 (L) 12 - 28   Sodium 142 134 - 144 mmol/L   Potassium 4.3 3.5 - 5.2 mmol/L   Chloride 105 96 - 106 mmol/L   CO2 21 20 - 29 mmol/L   Calcium 9.3 8.7 - 10.3 mg/dL   Total Protein 6.8 6.0 - 8.5 g/dL   Albumin 4.5 3.9 - 4.9 g/dL   Globulin, Total 2.3 1.5 - 4.5 g/dL   Bilirubin Total 0.3 0.0 - 1.2 mg/dL   Alkaline Phosphatase 102 44 - 121 IU/L   AST 21 0 - 40 IU/L   ALT 21 0 - 32 IU/L  Lipid Panel w/o Chol/HDL Ratio   Collection Time: 08/08/23  8:36 AM  Result Value Ref Range   Cholesterol, Total 108 100 - 199 mg/dL   Triglycerides 119 (H) 0 - 149 mg/dL   HDL 44 >14 mg/dL   VLDL Cholesterol Cal 38 5 - 40 mg/dL   LDL Chol Calc (NIH) 26 0 - 99 mg/dL  VITAMIN D 25 Hydroxy (Vit-D Deficiency, Fractures)   Collection Time: 08/08/23  8:36 AM  Result Value Ref Range   Vit D, 25-Hydroxy 59.0 30.0 - 100.0 ng/mL  CBC with Differential/Platelet   Collection Time: 08/08/23  8:36 AM  Result Value Ref Range   WBC 4.9 3.4 - 10.8 x10E3/uL   RBC 4.41 3.77 - 5.28 x10E6/uL   Hemoglobin 13.5 11.1 - 15.9 g/dL   Hematocrit 78.2 95.6 - 46.6 %   MCV 91 79 - 97 fL   MCH 30.6 26.6 - 33.0 pg   MCHC 33.7 31.5 - 35.7 g/dL   RDW 21.3 08.6 - 57.8 %   Platelets 145 (L) 150 - 450  x10E3/uL   Neutrophils 61 Not Estab. %   Lymphs 28 Not Estab. %   Monocytes 8 Not Estab. %   Eos 2 Not  Estab. %   Basos 1 Not Estab. %   Neutrophils Absolute 3.0 1.4 - 7.0 x10E3/uL   Lymphocytes Absolute 1.4 0.7 - 3.1 x10E3/uL   Monocytes Absolute 0.4 0.1 - 0.9 x10E3/uL   EOS (ABSOLUTE) 0.1 0.0 - 0.4 x10E3/uL   Basophils Absolute 0.1 0.0 - 0.2 x10E3/uL   Immature Granulocytes 0 Not Estab. %   Immature Grans (Abs) 0.0 0.0 - 0.1 x10E3/uL      Assessment & Plan:   Problem List Items Addressed This Visit       Cardiovascular and Mediastinum   Hot flashes due to menopause     Endocrine   Hyperlipidemia associated with type 2 diabetes mellitus (HCC)   Relevant Orders   Bayer DCA Hb A1c Waived   Comprehensive metabolic panel   Lipid Panel w/o Chol/HDL Ratio   Type 2 diabetes mellitus with hyperglycemia (HCC) - Primary   Relevant Orders   Bayer DCA Hb A1c Waived   Microalbumin, Urine Waived   Comprehensive metabolic panel   TSH     Genitourinary   OAB (overactive bladder)     Hematopoietic and Hemostatic   Thrombocytopenia (HCC)   Relevant Orders   CBC with Differential/Platelet     Other   B12 deficiency   Relevant Orders   Vitamin B12   Generalized anxiety disorder   Relevant Orders   TSH   Vitamin D deficiency   Relevant Orders   VITAMIN D 25 Hydroxy (Vit-D Deficiency, Fractures)   Other Visit Diagnoses       Encounter for annual physical exam       Annual physical today with labs and health maintenance reviewed, discussed with patient.        Follow up plan: No follow-ups on file.   LABORATORY TESTING:  - Pap smear: not applicable  IMMUNIZATIONS:   - Tetanus vaccination status reviewed: last tetanus booster within 10 years. - Influenza: Up to date - Pneumovax: Refused - Prevnar: Refused - COVID: Up to date - HPV: Not applicable - Shingrix vaccine: Up to date  SCREENING: -Mammogram: Up to date  - Colonoscopy: Up to date  - Bone Density: Not  applicable  -Hearing Test: Not applicable  -Spirometry: Not applicable   PATIENT COUNSELING:   Advised to take 1 mg of folate supplement per day if capable of pregnancy.   Sexuality: Discussed sexually transmitted diseases, partner selection, use of condoms, avoidance of unintended pregnancy  and contraceptive alternatives.   Advised to avoid cigarette smoking.  I discussed with the patient that most people either abstain from alcohol or drink within safe limits (<=14/week and <=4 drinks/occasion for males, <=7/weeks and <= 3 drinks/occasion for females) and that the risk for alcohol disorders and other health effects rises proportionally with the number of drinks per week and how often a drinker exceeds daily limits.  Discussed cessation/primary prevention of drug use and availability of treatment for abuse.   Diet: Encouraged to adjust caloric intake to maintain  or achieve ideal body weight, to reduce intake of dietary saturated fat and total fat, to limit sodium intake by avoiding high sodium foods and not adding table salt, and to maintain adequate dietary potassium and calcium preferably from fresh fruits, vegetables, and low-fat dairy products.    Stressed the importance of regular exercise  Injury prevention: Discussed safety belts, safety helmets, smoke detector, smoking near bedding or upholstery.   Dental health: Discussed importance of regular tooth brushing, flossing, and dental visits.    NEXT PREVENTATIVE  PHYSICAL DUE IN 1 YEAR. No follow-ups on file.

## 2023-11-05 NOTE — Progress Notes (Addendum)
 BP 125/73   Pulse 70   Temp 98 F (36.7 C) (Oral)   Ht 5' 2.2" (1.58 m)   Wt 155 lb (70.3 kg)   SpO2 98%   BMI 28.17 kg/m    Subjective:    Patient ID: Karen Lambert, female    DOB: Jun 28, 1961, 63 y.o.   MRN: [id number]  NOTE WRITTEN BY DNP STUDENT.  ASSESSMENT AND PLAN OF CARE REVIEWED WITH STUDENT, AGREE WITH ABOVE FINDINGS AND PLAN.  HPI: Karen Lambert is a 63 y.o. female presenting on 11/05/2023 for comprehensive medical examination. Current medical complaints include:none  She currently lives with: family Menopausal Symptoms: no  Continues to take Ditropan XL for OAB and stress incontinence with benefit.  Complains of sinus discomfort since having her sinus surgery.  Tylenol is not offering benefit.  DIABETES No longer taking Ozempic due to cost.   Takes Vitamin D and B12 supplements for history of low levels. History of low platelets on labs, recent stable. Hypoglycemic episodes:no Polydipsia/polyuria: no Visual disturbance: no Chest pain: no Paresthesias: no Glucose Monitoring: no  Accucheck frequency: Daily  Fasting glucose: 100  Evening: 143-145  Before meals: 100 Taking Insulin?: no Blood Pressure Monitoring: rarely Retinal Examination: Not up to Date Foot Exam: Up to Date Diabetic Education: Completed Pneumovax: Not up to Date Influenza: Up to Date Aspirin: no   HYPERTENSION / HYPERLIPIDEMIA Takes Rosuvastatin and Lovaza.  No current BP medications. Satisfied with current treatment? yes Duration of hypertension: years BP monitoring frequency: rarely BP range: 120-130/70-80 BP medication side effects: no Duration of hyperlipidemia: years Cholesterol medication side effects: no Cholesterol supplements: fish oil Medication compliance: good compliance Aspirin: no Recent stressors: yes Recurrent headaches: yes Visual changes: yes Palpitations: no Dyspnea: no Chest pain: no Lower extremity edema: no Dizzy/lightheaded: no    DEPRESSION Continues on Celexa and Buspar.  Gabapentin for menopausal symptoms.  Trazodone for sleep. Mood status: controlled Satisfied with current treatment?: yes Symptom severity: mild  Duration of current treatment : years Side effects: no Medication compliance: good compliance Psychotherapy/counseling: no    Depressed mood: yes Anxious mood: yes Anhedonia: no Significant weight loss or gain: no Insomnia: yes hard to fall asleep and staying asleep Fatigue: yes  Feelings of worthlessness or guilt: yes Impaired concentration/indecisiveness: occasionally  Suicidal ideations: no Hopelessness:  Occasionally Crying spells: no    11/05/2023    8:43 AM 08/08/2023    8:55 AM 05/08/2023    9:38 AM 03/13/2023    3:09 PM 02/06/2023    9:15 AM  Depression screen PHQ 2/9  Decreased Interest 2 2 2 2 3   Down, Depressed, Hopeless 2 1 2 1 2   PHQ - 2 Score 4 3 4 3 5   Altered sleeping 2 3 2 3 2   Tired, decreased energy 2 2 2 2 2   Change in appetite 2 1 2 2 2   Feeling bad or failure about yourself  2 2 2 2 2   Trouble concentrating 2 2 0 2 2  Moving slowly or fidgety/restless 2 1 0 2 1  Suicidal thoughts 0 0 0 1 1  PHQ-9 Score 16 14 12 17 17   Difficult doing work/chores Somewhat difficult Somewhat difficult Somewhat difficult Somewhat difficult Somewhat difficult      11/05/2023    8:53 AM 08/08/2023    8:55 AM 05/08/2023    9:39 AM 03/13/2023    3:10 PM  GAD 7 : Generalized Anxiety Score  Nervous, Anxious, on Edge 2 1 3  2  Control/stop worrying 3 3 2 2   Worry too much - different things 3 3 2 2   Trouble relaxing 2 2 2 3   Restless 2 1 1 2   Easily annoyed or irritable 2 2 2 2   Afraid - awful might happen 2 2 0 2  Total GAD 7 Score 16 14 12 15   Anxiety Difficulty Somewhat difficult Somewhat difficult Somewhat difficult Somewhat difficult        12/06/2022    8:49 AM 01/03/2023   10:58 AM 02/06/2023    9:15 AM 08/08/2023    8:55 AM 11/05/2023    8:54 AM  Fall Risk  Falls in the  past year? 0 0 0 1 0  Was there an injury with Fall? 0 0 0 0 0  Fall Risk Category Calculator 0 0 0 1 0  Patient at Risk for Falls Due to No Fall Risks No Fall Risks No Fall Risks No Fall Risks No Fall Risks  Fall risk Follow up Falls evaluation completed Falls evaluation completed Falls evaluation completed Falls evaluation completed Falls evaluation completed    Functional Status Survey: Is the patient deaf or have difficulty hearing?: No Does the patient have difficulty seeing, even when wearing glasses/contacts?: No Does the patient have difficulty concentrating, remembering, or making decisions?: No Does the patient have difficulty walking or climbing stairs?: No Does the patient have difficulty dressing or bathing?: No Does the patient have difficulty doing errands alone such as visiting a doctor's office or shopping?: No   Past Medical History:  Past Medical History:  Diagnosis Date   Anxiety    Broken ankle    COVID-19 05/10/2023   Symptoms resolved by 05/14/23   Generalized anxiety disorder    Hyperlipidemia    Overactive bladder    Skin cancer 2012   Sleep apnea    CPAP   Type 2 diabetes mellitus (HCC)    Vertigo    Wears dentures    partial upper and lower    Surgical History:  Past Surgical History:  Procedure Laterality Date   BASAL CELL CARCINOMA EXCISION     L shoulder and R upper thigh   CESAREAN SECTION     COLONOSCOPY WITH PROPOFOL N/A 12/20/2015   Procedure: COLONOSCOPY WITH PROPOFOL;  Surgeon: Wallace Cullens, MD;  Location: [facility];  Service: Gastroenterology;  Laterality: N/A;   NASAL SEPTOPLASTY W/ TURBINOPLASTY Bilateral 06/13/2023   Procedure: NASAL SEPTOPLASTY WITH INFERIORTURBINATE REDUCTION;  Surgeon: Bud Face, MD;  Location: [facility];  Service: ENT;  Laterality: Bilateral;  Diabetic   ROTATOR CUFF REPAIR Right 2019   TOTAL ABDOMINAL HYSTERECTOMY  2010    Medications:  Current Outpatient Medications on File Prior to Visit   Medication Sig   ascorbic acid (VITAMIN C) 500 MG tablet Take 500 mg by mouth daily.   Blood Glucose Monitoring Suppl (ONETOUCH VERIO) w/Device KIT Use to check blood sugar 3 times a day and document results, bring to appointments.  Goal is <130 fasting blood sugar and <180 two hours after meals.   busPIRone (BUSPAR) 10 MG tablet Take 1 tablet (10 mg total) by mouth 2 (two) times daily.   citalopram (CELEXA) 40 MG tablet Take 1 tablet (40 mg total) by mouth daily.   cyanocobalamin (VITAMIN B12) 1000 MCG/ML injection Inject 1 mL (1,000 mcg total) into the muscle every 30 (thirty) days.   gabapentin (NEURONTIN) 600 MG tablet TAKE 1/2 TABLET(300 MG) BY MOUTH AT BEDTIME   glucose blood test strip Use  to check blood sugar 3 times daily   Lancets (ONETOUCH ULTRASOFT) lancets Use to check blood sugar 3 times a day and document results, bring to appointments.  Goal is <130 fasting blood sugar and <180 two hours after meals.   omega-3 acid ethyl esters (LOVAZA) 1 g capsule Take 2 capsules (2 g total) by mouth 2 (two) times daily.   oxybutynin (DITROPAN-XL) 10 MG 24 hr tablet Take 1 tablet (10 mg total) by mouth at bedtime.   rosuvastatin (CRESTOR) 40 MG tablet Take 1 tablet (40 mg total) by mouth daily.   Syringe/Needle, Disp, (SYRINGE 3CC/20GX1-1/2") 20G X 1-1/2" 3 ML MISC Use to inject B12 shot monthly.   VITAMIN D PO Take by mouth daily. 1000 mg   Zinc 50 MG TABS Take 50 mg by mouth daily at 2 PM.   No current facility-administered medications on file prior to visit.    Allergies:  Allergies  Allergen Reactions   Latex Shortness Of Breath and Rash    Bandaids only    Social History:  Social History   Socioeconomic History   Marital status: Divorced    Spouse name: Not on file   Number of children: Not on file   Years of education: Not on file   Highest education level: Not on file  Occupational History   Not on file  Tobacco Use   Smoking status: Former    Current packs/day: 0.00     Average packs/day: 1.3 packs/day for 15.0 years (18.8 ttl pk-yrs)    Types: Cigarettes    Start date: 80    Quit date: 2008    Years since quitting: 17.1   Smokeless tobacco: Never  Vaping Use   Vaping status: Never Used  Substance and Sexual Activity   Alcohol use: Yes    Alcohol/week: 1.0 standard drink of alcohol    Types: 1 Glasses of wine per week    Comment: occasional   Drug use: Not Currently   Sexual activity: Not Currently  Other Topics Concern   Not on file  Social History Narrative   Not on file   Social Drivers of Health   Financial Resource Strain: Medium Risk (11/05/2023)   Overall Financial Resource Strain (CARDIA)    Difficulty of Paying Living Expenses: Somewhat hard  Food Insecurity: No Food Insecurity (11/05/2023)   Hunger Vital Sign    Worried About Running Out of Food in the Last Year: Never true    Ran Out of Food in the Last Year: Never true  Transportation Needs: No Transportation Needs (11/05/2023)   PRAPARE - Administrator, Civil Service (Medical): No    Lack of Transportation (Non-Medical): No  Physical Activity: Inactive (11/05/2023)   Exercise Vital Sign    Days of Exercise per Week: 0 days    Minutes of Exercise per Session: 0 min  Stress: Stress Concern Present (11/05/2023)   [employer] of Occupational Health - Occupational Stress Questionnaire    Feeling of Stress : Very much  Social Connections: Moderately Integrated (11/05/2023)   Social Connection and Isolation Panel [NHANES]    Frequency of Communication with Friends and Family: More than three times a week    Frequency of Social Gatherings with Friends and Family: Twice a week    Attends Religious Services: More than 4 times per year    Active Member of [employer] or Organizations: Yes    Attends Banker Meetings: Never    Marital Status: Divorced  Intimate  Partner Violence: Not At Risk (11/05/2023)   Humiliation, Afraid, Rape, and Kick questionnaire     Fear of Current or Ex-Partner: No    Emotionally Abused: No    Physically Abused: No    Sexually Abused: No   Social History   Tobacco Use  Smoking Status Former   Current packs/day: 0.00   Average packs/day: 1.3 packs/day for 15.0 years (18.8 ttl pk-yrs)   Types: Cigarettes   Start date: 78   Quit date: 2008   Years since quitting: 17.1  Smokeless Tobacco Never   Social History   Substance and Sexual Activity  Alcohol Use Yes   Alcohol/week: 1.0 standard drink of alcohol   Types: 1 Glasses of wine per week   Comment: occasional    Family History:  Family History  Problem Relation Age of Onset   Heart disease Mother    Diabetes Mother    Cataracts Mother    Dementia Mother    Cancer Father    Diabetes Father    Stroke Father    Lymphoma Brother    Diabetes Brother    Diabetes Brother    Breast cancer Paternal Aunt    Stroke Maternal Grandfather     Past medical history, surgical history, medications, allergies, family history and social history reviewed with patient today and changes made to appropriate areas of the chart.   Review of Systems  Constitutional:  Negative for fever and weight loss.  HENT:  Positive for congestion and sinus pain. Negative for ear discharge, ear pain and hearing loss.   Eyes:  Negative for blurred vision, double vision, discharge and redness.  Respiratory:  Negative for cough, shortness of breath and wheezing.   Cardiovascular:  Negative for chest pain, palpitations and leg swelling.  Gastrointestinal:  Negative for abdominal pain, constipation, diarrhea, nausea and vomiting.  Genitourinary:  Negative for dysuria, frequency and urgency.  Musculoskeletal:  Negative for falls, joint pain and neck pain.  Skin:  Negative for itching and rash.  Neurological:  Positive for headaches. Negative for dizziness.  Endo/Heme/Allergies:  Negative for polydipsia. Does not bruise/bleed easily.  Psychiatric/Behavioral:  Positive for depression.  Negative for suicidal ideas. The patient is nervous/anxious and has insomnia.    All other ROS negative except what is listed above and in the HPI.      Objective:    BP 125/73   Pulse 70   Temp 98 F (36.7 C) (Oral)   Ht 5' 2.2" (1.58 m)   Wt 155 lb (70.3 kg)   SpO2 98%   BMI 28.17 kg/m   Wt Readings from Last 3 Encounters:  11/05/23 155 lb (70.3 kg)  08/08/23 155 lb 9.6 oz (70.6 kg)  06/13/23 158 lb 6.4 oz (71.8 kg)    Physical Exam Vitals and nursing note reviewed.  Constitutional:      General: She is not in acute distress.    Appearance: Normal appearance. She is normal weight. She is not ill-appearing or toxic-appearing.  HENT:     Head: Normocephalic.     Right Ear: Tympanic membrane, ear canal and external ear normal. There is no impacted cerumen.     Left Ear: Tympanic membrane, ear canal and external ear normal. There is no impacted cerumen.     Nose: Nose normal.     Mouth/Throat:     Pharynx: No oropharyngeal exudate or posterior oropharyngeal erythema.  Eyes:     Extraocular Movements: Extraocular movements intact.     Conjunctiva/sclera: Conjunctivae  normal.     Funduscopic exam:    Right eye: Red reflex present.        Left eye: Red reflex present. Neck:     Thyroid: No thyroid tenderness.     Vascular: No carotid bruit.  Cardiovascular:     Rate and Rhythm: Normal rate and regular rhythm.     Heart sounds: Normal heart sounds. No murmur heard. Pulmonary:     Effort: Pulmonary effort is normal. No tachypnea or accessory muscle usage.     Breath sounds: Normal breath sounds. No wheezing.  Abdominal:     General: Bowel sounds are normal. There is no distension.     Palpations: Abdomen is soft.     Tenderness: There is no abdominal tenderness. There is no guarding.  Musculoskeletal:        General: No swelling. Normal range of motion.     Cervical back: Normal range of motion and neck supple. No tenderness.     Right lower leg: No edema.     Left  lower leg: No edema.  Lymphadenopathy:     Cervical: No cervical adenopathy.     Right cervical: No superficial cervical adenopathy.    Left cervical: No superficial cervical adenopathy.  Skin:    General: Skin is warm and dry.     Findings: No lesion or rash.  Neurological:     General: No focal deficit present.     Mental Status: She is alert and oriented to person, place, and time. Mental status is at baseline.     Motor: Motor function is intact.     Deep Tendon Reflexes: Reflexes are normal and symmetric.  Psychiatric:        Attention and Perception: Attention and perception normal.        Mood and Affect: Mood and affect normal.        Speech: Speech normal.        Behavior: Behavior normal. Behavior is cooperative.        Thought Content: Thought content normal.        Cognition and Memory: Cognition and memory normal.        Judgment: Judgment normal.     Results for orders placed or performed in visit on 11/05/23  Bayer DCA Hb A1c Waived   Collection Time: 11/05/23  8:53 AM  Result Value Ref Range   HB A1C (BAYER DCA - WAIVED) 6.1 (H) 4.8 - 5.6 %  Microalbumin, Urine Waived   Collection Time: 11/05/23  8:53 AM  Result Value Ref Range   Microalb, Ur Waived 80 (H) 0 - 19 mg/L   Creatinine, Urine Waived 200 10 - 300 mg/dL   Microalb/Creat Ratio 30-300 (H) <30 mg/g      Assessment & Plan:   Problem List Items Addressed This Visit       Cardiovascular and Mediastinum   Hot flashes due to menopause   Chronic, stable. Continue with Celexa daily and Gabapentin as needed.        Respiratory   Seasonal allergic rhinitis due to pollen   Chronic, stable, continue current medication regimen and adjust as needed.  Have recommended she follow-up with ENT in regards to ongoing sinus pressure post surgery.        Endocrine   Hyperlipidemia associated with type 2 diabetes mellitus (HCC)   Chronic, ongoing. Focus on diet. Continue Rosuvastatin and Lovaza at this time.  Repeat lipid panel today.      Relevant Orders  Bayer DCA Hb A1c Waived (Completed)   Comprehensive metabolic panel   Lipid Panel w/o Chol/HDL Ratio   Type 2 diabetes mellitus with hyperglycemia (HCC) - Primary   Chronic, stable.  A1c 6.1 today. No longer taking Ozempic due to cost. Continue to focus on diet without medication on board. B12 injection today.       Relevant Orders   Bayer DCA Hb A1c Waived (Completed)   Microalbumin, Urine Waived (Completed)   Comprehensive metabolic panel   TSH     Genitourinary   OAB (overactive bladder)   Chronic, ongoing. Continue Ditropan daily, continues to offer her benefit.        Hematopoietic and Hemostatic   Thrombocytopenia (HCC)   Stable.  Recheck labs today. Continue to monitor.      Relevant Orders   CBC with Differential/Platelet     Other   B12 deficiency   Chronic, ongoing. B12 injection given today. Labs checked today. Follow-up in 6 months.      Relevant Orders   Vitamin B12   Generalized anxiety disorder   Chronic, ongoing. 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 Mother placed in Memory Care Facility in January. Continue Celexa 40 mg daily and Buspar 10 mg BID. Increase Trazadone to 100 mg at HS for continued sleep issues.       Relevant Medications   traZODone (DESYREL) 100 MG tablet   Other Relevant Orders   TSH   Vitamin D deficiency   Chronic, ongoing. Labs checked today. Continue supplement at this time.      Relevant Orders   VITAMIN D 25 Hydroxy (Vit-D Deficiency, Fractures)   Other Visit Diagnoses       Encounter for annual physical exam       Annual physical today with labs and health maintenance reviewed, discussed with patient.        Follow up plan: Return in about 6 months (around 05/04/2024) for T2DM, HTN/HLD, Depression, INSOMNIA.    LABORATORY TESTING:  - Pap smear: not applicable  IMMUNIZATIONS:   - Tetanus vaccination status reviewed: last tetanus booster within 10 years. -  Influenza: Up to date - Pneumovax: Refused - Prevnar: Refused - COVID: Up to date - HPV: Not applicable - Shingrix vaccine: Up to date  SCREENING: -Mammogram:  scheduled3   - Colonoscopy: Up to date  - Bone Density: Not applicable  -Hearing Test: Not applicable  -Spirometry: Not applicable   PATIENT COUNSELING:   Advised to take 1 mg of folate supplement per day if capable of pregnancy.   Sexuality: Discussed sexually transmitted diseases, partner selection, use of condoms, avoidance of unintended pregnancy  and contraceptive alternatives.   Advised to avoid cigarette smoking.  I discussed with the patient that most people either abstain from alcohol or drink within safe limits (<=14/week and <=4 drinks/occasion for males, <=7/weeks and <= 3 drinks/occasion for females) and that the risk for alcohol disorders and other health effects rises proportionally with the number of drinks per week and how often a drinker exceeds daily limits.  Discussed cessation/primary prevention of drug use and availability of treatment for abuse.   Diet: Encouraged to adjust caloric intake to maintain  or achieve ideal body weight, to reduce intake of dietary saturated fat and total fat, to limit sodium intake by avoiding high sodium foods and not adding table salt, and to maintain adequate dietary potassium and calcium preferably from fresh fruits, vegetables, and low-fat dairy products.    Stressed the importance of regular  exercise  Injury prevention: Discussed safety belts, safety helmets, smoke detector, smoking near bedding or upholstery.   Dental health: Discussed importance of regular tooth brushing, flossing, and dental visits.    NEXT PREVENTATIVE PHYSICAL DUE IN 1 YEAR. Return in about 6 months (around 05/04/2024) for T2DM, HTN/HLD, Depression, INSOMNIA.

## 2023-11-05 NOTE — Assessment & Plan Note (Addendum)
 Chronic, stable.  A1c 6.1 today. No longer taking Ozempic due to cost. Continue to focus on diet without medication on board. B12 injection today.

## 2023-11-05 NOTE — Assessment & Plan Note (Signed)
 Chronic, stable. Continue with Celexa daily and Gabapentin as needed.

## 2023-11-05 NOTE — Assessment & Plan Note (Addendum)
 Chronic, stable, continue current medication regimen and adjust as needed.  Have recommended she follow-up with ENT in regards to ongoing sinus pressure post surgery.

## 2023-11-05 NOTE — Assessment & Plan Note (Signed)
 Chronic, ongoing. B12 injection given today. Labs checked today. Follow-up in 6 months.

## 2023-11-05 NOTE — Assessment & Plan Note (Signed)
 Chronic, ongoing. Focus on diet. Continue Rosuvastatin and Lovaza at this time. Repeat lipid panel today.

## 2023-11-06 ENCOUNTER — Encounter: Payer: Self-pay | Admitting: Nurse Practitioner

## 2023-11-06 LAB — CBC WITH DIFFERENTIAL/PLATELET
Basophils Absolute: 0.1 10*3/uL (ref 0.0–0.2)
Basos: 1 %
EOS (ABSOLUTE): 0.1 10*3/uL (ref 0.0–0.4)
Eos: 2 %
Hematocrit: 38.7 % (ref 34.0–46.6)
Hemoglobin: 12.7 g/dL (ref 11.1–15.9)
Immature Grans (Abs): 0 10*3/uL (ref 0.0–0.1)
Immature Granulocytes: 0 %
Lymphocytes Absolute: 1.6 10*3/uL (ref 0.7–3.1)
Lymphs: 32 %
MCH: 29.8 pg (ref 26.6–33.0)
MCHC: 32.8 g/dL (ref 31.5–35.7)
MCV: 91 fL (ref 79–97)
Monocytes Absolute: 0.4 10*3/uL (ref 0.1–0.9)
Monocytes: 9 %
Neutrophils Absolute: 2.7 10*3/uL (ref 1.4–7.0)
Neutrophils: 56 %
Platelets: 161 10*3/uL (ref 150–450)
RBC: 4.26 x10E6/uL (ref 3.77–5.28)
RDW: 13.1 % (ref 11.7–15.4)
WBC: 4.9 10*3/uL (ref 3.4–10.8)

## 2023-11-06 LAB — COMPREHENSIVE METABOLIC PANEL
ALT: 15 IU/L (ref 0–32)
AST: 20 IU/L (ref 0–40)
Albumin: 4.5 g/dL (ref 3.9–4.9)
Alkaline Phosphatase: 110 IU/L (ref 44–121)
BUN/Creatinine Ratio: 9 — ABNORMAL LOW (ref 12–28)
BUN: 8 mg/dL (ref 8–27)
Bilirubin Total: 0.2 mg/dL (ref 0.0–1.2)
CO2: 20 mmol/L (ref 20–29)
Calcium: 9.1 mg/dL (ref 8.7–10.3)
Chloride: 103 mmol/L (ref 96–106)
Creatinine, Ser: 0.86 mg/dL (ref 0.57–1.00)
Globulin, Total: 2.2 g/dL (ref 1.5–4.5)
Glucose: 118 mg/dL — ABNORMAL HIGH (ref 70–99)
Potassium: 4.5 mmol/L (ref 3.5–5.2)
Sodium: 139 mmol/L (ref 134–144)
Total Protein: 6.7 g/dL (ref 6.0–8.5)
eGFR: 76 mL/min/{1.73_m2} (ref >59)

## 2023-11-06 LAB — LIPID PANEL W/O CHOL/HDL RATIO
Cholesterol, Total: 117 mg/dL (ref 100–199)
HDL: 47 mg/dL (ref >39)
LDL Chol Calc (NIH): 45 mg/dL (ref 0–99)
Triglycerides: 148 mg/dL (ref 0–149)
VLDL Cholesterol Cal: 25 mg/dL (ref 5–40)

## 2023-11-06 LAB — VITAMIN D 25 HYDROXY (VIT D DEFICIENCY, FRACTURES): Vit D, 25-Hydroxy: 45.2 ng/mL (ref 30.0–100.0)

## 2023-11-06 LAB — TSH: TSH: 2.47 u[IU]/mL (ref 0.450–4.500)

## 2023-11-06 LAB — VITAMIN B12: Vitamin B-12: 729 pg/mL (ref 232–1245)

## 2023-11-06 NOTE — Progress Notes (Signed)
 Contacted via MyChart   Good morning Karen Lambert, your labs have returned and overall they look great.  No changes needed.  Any questions? Keep being amazing!!  Thank you for allowing me to participate in your care.  I appreciate you. Kindest regards, Keishawn Darsey

## 2023-11-23 ENCOUNTER — Ambulatory Visit
Admission: RE | Admit: 2023-11-23 | Discharge: 2023-11-23 | Disposition: A | Discharge status: Home / Self Care | Payer: Managed Care, Other (non HMO) | Source: Ambulatory Visit | Attending: Nurse Practitioner | Admitting: Nurse Practitioner

## 2023-11-23 DIAGNOSIS — Z1231 Encounter for screening mammogram for malignant neoplasm of breast: Secondary | ICD-10-CM | POA: Diagnosis present

## 2023-11-27 ENCOUNTER — Encounter: Payer: Self-pay | Admitting: Nurse Practitioner

## 2023-11-27 NOTE — Progress Notes (Signed)
 Contacted via MyChart   Normal mammogram, may repeat in one year:)

## 2024-01-04 ENCOUNTER — Other Ambulatory Visit: Payer: Self-pay | Admitting: Nurse Practitioner

## 2024-01-04 NOTE — Telephone Encounter (Signed)
 Requested medication (s) are due for refill today: yes  Requested medication (s) are on the active medication list: yes  Last refill:  12/06/22  Future visit scheduled: no  Notes to clinic:  no protocol attached      Requested Prescriptions  Pending Prescriptions Disp Refills   B-D 3CC LUER-LOK SYR 23GX1-1/2 23G X 1-1/2" 3 ML MISC [Pharmacy Med Name: B-D #[id number] SYR/NDL 23GX1-1/2 LL] 50 each     Sig: USE TO INJECT B12 SHOT MONTHLY     There is no refill protocol information for this order

## 2024-01-30 ENCOUNTER — Other Ambulatory Visit: Payer: Self-pay | Admitting: Nurse Practitioner

## 2024-01-31 NOTE — Telephone Encounter (Signed)
 Requested Prescriptions  Pending Prescriptions Disp Refills   busPIRone  (BUSPAR ) 10 MG tablet [Pharmacy Med Name: BUSPIRONE  10MG  TABLETS] 180 tablet 0    Sig: TAKE 1 TABLET(10 MG) BY MOUTH TWICE DAILY     Psychiatry: Anxiolytics/Hypnotics - Non-controlled Passed - 01/31/2024  3:55 PM      Passed - Valid encounter within last 12 months    Recent Outpatient Visits           2 months ago Type 2 diabetes mellitus with hyperglycemia, without long-term current use of insulin (HCC)   [place] [facility] [place], Lavelle Posey, NP       Future Appointments             In 3 months Cannady, Jolene T, NP [facility]

## 2024-02-05 ENCOUNTER — Ambulatory Visit: Payer: Managed Care, Other (non HMO) | Admitting: Dermatology

## 2024-02-07 ENCOUNTER — Other Ambulatory Visit: Payer: Self-pay | Admitting: Nurse Practitioner

## 2024-02-08 NOTE — Telephone Encounter (Signed)
 Requested Prescriptions  Pending Prescriptions Disp Refills   oxybutynin  (DITROPAN -XL) 10 MG 24 hr tablet [Pharmacy Med Name: OXYBUTYNIN  ER 10MG  TABLETS] 90 tablet 1    Sig: TAKE 1 TABLET(10 MG) BY MOUTH AT BEDTIME     Urology:  Bladder Agents Passed - 02/08/2024  5:07 PM      Passed - Valid encounter within last 12 months    Recent Outpatient Visits           3 months ago Type 2 diabetes mellitus with hyperglycemia, without long-term current use of insulin (HCC)   [place] [facility] [place], Lavelle Posey, NP       Future Appointments             In 3 months Cannady, Jolene T, NP  [facility]

## 2024-02-29 ENCOUNTER — Other Ambulatory Visit: Payer: Self-pay | Admitting: Nurse Practitioner

## 2024-03-04 NOTE — Telephone Encounter (Signed)
 Copied from CRM [id number]. Topic: Clinical - Prescription Issue >> Mar 04, 2024  9:21 AM Willma SAUNDERS wrote: Reason for CRM: Pel from [facility] pharmacy calling regards to patients refill for citalopram  (CELEXA ) 40 MG tablet. States they did not get any refill information and it asking if it can be sent again.  Pel from [facility] pharmacy can be reached at [phone]

## 2024-03-04 NOTE — Telephone Encounter (Signed)
 Requested Prescriptions  Pending Prescriptions Disp Refills   citalopram  (CELEXA ) 40 MG tablet [Pharmacy Med Name: CITALOPRAM  40MG  TABLETS] 90 tablet 0    Sig: TAKE 1 TABLET(40 MG) BY MOUTH DAILY     Psychiatry:  Antidepressants - SSRI Passed - 03/04/2024  9:37 AM      Passed - Valid encounter within last 6 months    Recent Outpatient Visits           4 months ago Type 2 diabetes mellitus with hyperglycemia, without long-term current use of insulin (HCC)   [place] [facility] [place], Melanie DASEN, NP       Future Appointments             In 2 months Cannady, Jolene T, NP [facility]

## 2024-03-08 ENCOUNTER — Encounter: Payer: Self-pay | Admitting: Nurse Practitioner

## 2024-05-04 NOTE — Patient Instructions (Signed)

## 2024-05-08 ENCOUNTER — Ambulatory Visit: Payer: Managed Care, Other (non HMO) | Admitting: Nurse Practitioner

## 2024-05-08 ENCOUNTER — Encounter: Payer: Self-pay | Admitting: Nurse Practitioner

## 2024-05-08 VITALS — BP 126/80 | HR 57 | Temp 97.9°F | Wt 157.4 lb

## 2024-05-08 DIAGNOSIS — D696 Thrombocytopenia, unspecified: Secondary | ICD-10-CM

## 2024-05-08 DIAGNOSIS — E785 Hyperlipidemia, unspecified: Secondary | ICD-10-CM

## 2024-05-08 DIAGNOSIS — N951 Menopausal and female climacteric states: Secondary | ICD-10-CM

## 2024-05-08 DIAGNOSIS — Z23 Encounter for immunization: Secondary | ICD-10-CM

## 2024-05-08 DIAGNOSIS — E1169 Type 2 diabetes mellitus with other specified complication: Secondary | ICD-10-CM

## 2024-05-08 DIAGNOSIS — E1165 Type 2 diabetes mellitus with hyperglycemia: Secondary | ICD-10-CM | POA: Diagnosis not present

## 2024-05-08 DIAGNOSIS — F411 Generalized anxiety disorder: Secondary | ICD-10-CM

## 2024-05-08 LAB — BAYER DCA HB A1C WAIVED: HB A1C (BAYER DCA - WAIVED): 6.6 % — ABNORMAL HIGH (ref 4.8–5.6)

## 2024-05-08 MED ORDER — BUPROPION HCL ER (XL) 150 MG PO TB24: 150.0000 mg | ORAL_TABLET | Freq: Every day | ORAL | 2 refills | Status: DC

## 2024-05-08 NOTE — Assessment & Plan Note (Signed)
 Chronic, ongoing.  Repeat lipid panel today and focus on diet.  Continue Lovaza and Crestor at this time.

## 2024-05-08 NOTE — Assessment & Plan Note (Signed)
 Chronic, stable.  Improved with Celexa and Gabapentin.  Continue current regimen and adjust as needed.

## 2024-05-08 NOTE — Progress Notes (Signed)
 BP 126/80   Pulse (!) 57   Temp 97.9 F (36.6 C) (Oral)   Wt 157 lb 6.4 oz (71.4 kg)   SpO2 95%   BMI 28.60 kg/m    Subjective:    Patient ID: Karen Lambert, female    DOB: [birth year], 63 y.o.   MRN: [id number]  HPI: Karen Lambert is a 63 y.o. female  Chief Complaint  Patient presents with   Diabetes   Hypertension   Hyperlipidemia   Anxiety   DIABETES Diet controlled at present.  February A1c 6.1%. Platelet count mildly low on labs for past months -- 132 and 144.  Last check improved at 161. Using GLP 1 patches from online, has 3 months worth for $30.   HISTORY: Tried Metformin  which caused issues with angular cheilitis. Ozempic  too expensive. Hypoglycemic episodes:no Polydipsia/polyuria: no Visual disturbance: no Chest pain: no Paresthesias: no Glucose Monitoring: yes  Accucheck frequency: occasional -- had a 150 recently  Fasting glucose:  Post prandial:  Evening:  Before meals: Taking Insulin?: no  Long acting insulin:  Short acting insulin: Blood Pressure Monitoring: not checking Retinal Examination: Up to Date -- Dr. Christobal [facility] Exam: Up to Date Diabetic Education: Not Completed Pneumovax: Provided today Influenza: Up to Date Aspirin: no   HYPERTENSION / HYPERLIPIDEMIA No current ACE or ARB due to stable BP.  Continues on Rosuvastatin  and Omega 3. Satisfied with current treatment? yes Duration of hypertension: chronic BP monitoring frequency: not checking BP range:  BP medication side effects: no Duration of hyperlipidemia: chronic Cholesterol medication side effects: no Cholesterol supplements: fish oil Medication compliance: poor compliance Aspirin: no Recent stressors: no Recurrent headaches: no Visual changes: no Palpitations: occasional Dyspnea: no Chest pain: no Lower extremity edema: no Dizzy/lightheaded: no   ANXIETY/STRESS Continues Celexa  40 MG daily, Buspar  10 MG BID, and Gabapentin  for hot flashes. Takes  Trazodone  as needed for sleep.  Her mother passed in April which she is struggling with. Duration:uncontrolled Anxious mood: yes  Excessive worrying: yes Irritability: yes  Sweating: no Nausea: no Palpitations:no Hyperventilation: no Panic attacks: no Agoraphobia: no  Obscessions/compulsions: no Depressed mood: yes    05-28-24    8:57 AM 11/05/2023    8:43 AM 08/08/2023    8:55 AM 05/08/2023    9:38 AM 03/13/2023    3:09 PM  Depression screen PHQ 2/9  Decreased Interest 3 2 2 2 2   Down, Depressed, Hopeless 2 2 1 2 1   PHQ - 2 Score 5 4 3 4 3   Altered sleeping 2 2 3 2 3   Tired, decreased energy 3 2 2 2 2   Change in appetite 1 2 1 2 2   Feeling bad or failure about yourself  2 2 2 2 2   Trouble concentrating 3 2 2  0 2  Moving slowly or fidgety/restless 2 2 1  0 2  Suicidal thoughts 0 0 0 0 1  PHQ-9 Score 18 16 14 12 17   Difficult doing work/chores Somewhat difficult Somewhat difficult Somewhat difficult Somewhat difficult Somewhat difficult  Anhedonia: yes Weight changes: no Insomnia: yes hard to stay asleep  Hypersomnia: no Fatigue/loss of energy: yes Feelings of worthlessness: no Feelings of guilt: yes Impaired concentration/indecisiveness: no Suicidal ideations: no  Crying spells: yes Recent Stressors/Life Changes: yes   Relationship problems: no   Family stress: no     Financial stress: no    Job stress: no    Recent death/loss: yes     05-28-24  8:58 AM 11/05/2023    8:53 AM 08/08/2023    8:55 AM 05/08/2023    9:39 AM  GAD 7 : Generalized Anxiety Score  Nervous, Anxious, on Edge 3 2 1 3   Control/stop worrying 3 3 3 2   Worry too much - different things 3 3 3 2   Trouble relaxing 3 2 2 2   Restless 2 2 1 1   Easily annoyed or irritable 3 2 2 2   Afraid - awful might happen 3 2 2  0  Total GAD 7 Score 20 16 14 12   Anxiety Difficulty Very difficult Somewhat difficult Somewhat difficult Somewhat difficult   Relevant past medical, surgical, family and social  history reviewed and updated as indicated. Interim medical history since our last visit reviewed. Allergies and medications reviewed and updated.  Review of Systems  Constitutional:  Positive for fatigue. Negative for activity change, appetite change, diaphoresis and fever.  Respiratory:  Negative for cough, chest tightness and shortness of breath.   Cardiovascular:  Negative for chest pain, palpitations and leg swelling.  Gastrointestinal: Negative.   Endocrine: Negative for polydipsia, polyphagia and polyuria.  Neurological: Negative.   Psychiatric/Behavioral:  Positive for sleep disturbance. Negative for decreased concentration, self-injury and suicidal ideas. The patient is nervous/anxious.    Per HPI unless specifically indicated above     Objective:    BP 126/80   Pulse (!) 57   Temp 97.9 F (36.6 C) (Oral)   Wt 157 lb 6.4 oz (71.4 kg)   SpO2 95%   BMI 28.60 kg/m   Wt Readings from Last 3 Encounters:  05/08/24 157 lb 6.4 oz (71.4 kg)  11/05/23 155 lb (70.3 kg)  08/08/23 155 lb 9.6 oz (70.6 kg)    Physical Exam Vitals and nursing note reviewed.  Constitutional:      General: She is awake. She is not in acute distress.    Appearance: She is well-developed and well-groomed. She is not ill-appearing or toxic-appearing.  HENT:     Head: Normocephalic.     Right Ear: Hearing and external ear normal.     Left Ear: Hearing and external ear normal.  Eyes:     General: Lids are normal.        Right eye: No discharge.        Left eye: No discharge.     Conjunctiva/sclera: Conjunctivae normal.     Pupils: Pupils are equal, round, and reactive to light.  Neck:     Thyroid: No thyromegaly.     Vascular: No carotid bruit.  Cardiovascular:     Rate and Rhythm: Regular rhythm. Bradycardia present.     Heart sounds: Normal heart sounds. No murmur heard.    No gallop.  Pulmonary:     Effort: Pulmonary effort is normal. No accessory muscle usage or respiratory distress.      Breath sounds: Normal breath sounds.  Abdominal:     General: Bowel sounds are normal. There is no distension.     Palpations: Abdomen is soft.     Tenderness: There is no abdominal tenderness.  Musculoskeletal:     Cervical back: Normal range of motion and neck supple.     Right lower leg: No edema.     Left lower leg: No edema.  Lymphadenopathy:     Cervical: No cervical adenopathy.  Skin:    General: Skin is warm and dry.  Neurological:     Mental Status: She is alert and oriented to person, place, and time.  Deep Tendon Reflexes: Reflexes are normal and symmetric.     Reflex Scores:      Brachioradialis reflexes are 2+ on the right side and 2+ on the left side.      Patellar reflexes are 2+ on the right side and 2+ on the left side. Psychiatric:        Attention and Perception: Attention normal.        Mood and Affect: Mood normal.        Speech: Speech normal.        Behavior: Behavior normal. Behavior is cooperative.        Thought Content: Thought content normal.    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  Improved with Celexa  and Gabapentin .  Continue current regimen and adjust as needed.        Endocrine   Type 2 diabetes mellitus with hyperglycemia [facility]) - Primary   Diagnosed August 2023.  A1c today slight trend up to 6.6%, continues diet only focus.  Urine ALB 80 August 2025 -- no current ACE or ARB, may benefit from this in future. Continue to monitor blood sugar at home daily and document.  Continue focus on diabetic diet.   - Need ACE or ARB, is on statin - Vaccinations up to date. - Foot and eye exams up to date.      Relevant Orders   Bayer  DCA Hb A1c Waived (Completed)   Hyperlipidemia associated with type 2 diabetes mellitus (HCC)   Chronic, ongoing.  Repeat lipid panel today and focus on diet.  Continue Lovaza  and Crestor  at this time.      Relevant Orders   Bayer DCA Hb A1c Waived (Completed)   Comprehensive metabolic panel with GFR   Lipid Panel w/o Chol/HDL Ratio     Hematopoietic and Hemostatic   Thrombocytopenia (HCC)   Noticed on past labs, mild.  Recent level normal, continue to monitor and recheck next visit.      Relevant Orders   Bayer DCA Hb A1c Waived (Completed)     Other   Generalized anxiety disorder   Chronic, ongoing  Having more anhedonia and sadness since mother passed in April 2025. Denies SI/HI.  Discussed options, will trial adding on Wellbutrin  XR 150 MG daily which may offer more energy, less anhedonia, and help depression.  Educated her on this medication.  Continue Celexa  40 MG daily and Buspar  to 10 MG BID.  Continue Trazodone  PRN 25-50 MG at HS for short period for sleep needs. Discussed various methods of meditation and relaxation techniques at home.  Recommend she speak to [facility] about support groups for grieving.      Relevant Medications   buPROPion  (WELLBUTRIN  XL) 150 MG 24 hr tablet   Other Visit Diagnoses       Pneumococcal vaccination given       PCV20 in office today, educated patient.   Relevant Orders   Pneumococcal conjugate vaccine 20-valent (Completed)        Follow up plan: Return in about 6 weeks (around 06/19/2024) for Depression, ANXIETY.

## 2024-05-08 NOTE — Assessment & Plan Note (Signed)
 Noticed on past labs, mild.  Recent level normal, continue to monitor and recheck next visit.

## 2024-05-08 NOTE — Assessment & Plan Note (Signed)
 Diagnosed August 2023.  A1c today slight trend up to 6.6%, continues diet only focus.  Urine ALB 80 August 2025 -- no current ACE or ARB, may benefit from this in future. Continue to monitor blood sugar at home daily and document.  Continue focus on diabetic diet.   - Need ACE or ARB, is on statin - Vaccinations up to date. - Foot and eye exams up to date.

## 2024-05-08 NOTE — Assessment & Plan Note (Signed)
 Chronic, ongoing  Having more anhedonia and sadness since mother passed in April 2025. Denies SI/HI.  Discussed options, will trial adding on Wellbutrin  XR 150 MG daily which may offer more energy, less anhedonia, and help depression.  Educated her on this medication.  Continue Celexa  40 MG daily and Buspar  to 10 MG BID.  Continue Trazodone  PRN 25-50 MG at HS for short period for sleep needs. Discussed various methods of meditation and relaxation techniques at home.  Recommend she speak to [facility] about support groups for grieving.

## 2024-05-09 ENCOUNTER — Ambulatory Visit: Payer: Self-pay | Admitting: Nurse Practitioner

## 2024-05-09 LAB — LIPID PANEL W/O CHOL/HDL RATIO
Cholesterol, Total: 244 mg/dL — ABNORMAL HIGH (ref 100–199)
HDL: 47 mg/dL (ref >39)
LDL Chol Calc (NIH): 141 mg/dL — ABNORMAL HIGH (ref 0–99)
Triglycerides: 306 mg/dL — ABNORMAL HIGH (ref 0–149)
VLDL Cholesterol Cal: 56 mg/dL — ABNORMAL HIGH (ref 5–40)

## 2024-05-09 LAB — COMPREHENSIVE METABOLIC PANEL WITH GFR
ALT: 32 IU/L (ref 0–32)
AST: 29 IU/L (ref 0–40)
Albumin: 4.6 g/dL (ref 3.9–4.9)
Alkaline Phosphatase: 90 IU/L (ref 44–121)
BUN/Creatinine Ratio: 8 — ABNORMAL LOW (ref 12–28)
BUN: 8 mg/dL (ref 8–27)
Bilirubin Total: 0.4 mg/dL (ref 0.0–1.2)
CO2: 19 mmol/L — ABNORMAL LOW (ref 20–29)
Calcium: 9.8 mg/dL (ref 8.7–10.3)
Chloride: 100 mmol/L (ref 96–106)
Creatinine, Ser: 0.97 mg/dL (ref 0.57–1.00)
Globulin, Total: 2.3 g/dL (ref 1.5–4.5)
Glucose: 113 mg/dL — ABNORMAL HIGH (ref 70–99)
Potassium: 4.3 mmol/L (ref 3.5–5.2)
Sodium: 137 mmol/L (ref 134–144)
Total Protein: 6.9 g/dL (ref 6.0–8.5)
eGFR: 66 mL/min/1.73 (ref >59)

## 2024-05-09 NOTE — Progress Notes (Signed)
 Contacted via MyChart  Good afternoon Karen Lambert, your labs have returned: - Kidney function, creatinine and eGFR, remains normal, as is liver function, AST and ALT.  - Lipid panel levels showing significant trends up, I recommend restarting Rosuvastatin  to get the LDL back under 55. Any questions? Keep being stellar!!  Thank you for allowing me to participate in your care.  I appreciate you. Kindest regards, Bentlee Drier

## 2024-05-19 MED ORDER — BUPROPION HCL ER (XL) 150 MG PO TB24: 150.0000 mg | ORAL_TABLET | Freq: Every day | ORAL | 3 refills | Status: AC

## 2024-05-23 ENCOUNTER — Other Ambulatory Visit: Payer: Self-pay | Admitting: Nurse Practitioner

## 2024-05-26 NOTE — Telephone Encounter (Signed)
 Requested Prescriptions  Pending Prescriptions Disp Refills   busPIRone  (BUSPAR ) 10 MG tablet [Pharmacy Med Name: BUSPIRONE  10MG  TABLETS] 180 tablet 0    Sig: TAKE 1 TABLET(10 MG) BY MOUTH TWICE DAILY     Psychiatry: Anxiolytics/Hypnotics - Non-controlled Passed - 05/26/2024 12:50 PM      Passed - Valid encounter within last 12 months    Recent Outpatient Visits           2 weeks ago Type 2 diabetes mellitus with hyperglycemia, without long-term current use of insulin (HCC)   [place] [facility] [place], [place] T, NP   6 months ago Type 2 diabetes mellitus with hyperglycemia, without long-term current use of insulin (HCC)   [place] [facility] [place], Melanie DASEN, NP

## 2024-06-01 ENCOUNTER — Other Ambulatory Visit: Payer: Self-pay | Admitting: Nurse Practitioner

## 2024-06-02 MED ORDER — CITALOPRAM HYDROBROMIDE 40 MG PO TABS: 40.0000 mg | ORAL_TABLET | Freq: Every day | ORAL | 3 refills | Status: AC

## 2024-06-02 NOTE — Addendum Note (Signed)
 Addended by: Mariselda Badalamenti T on: 06/02/2024 11:24 AM   Modules accepted: Orders

## 2024-06-02 NOTE — Telephone Encounter (Signed)
 Requested Prescriptions  Refused Prescriptions Disp Refills   citalopram  (CELEXA ) 40 MG tablet [Pharmacy Med Name: CITALOPRAM  40MG  TABLETS] 90 tablet 0    Sig: TAKE 1 TABLET(40 MG) BY MOUTH DAILY     Psychiatry:  Antidepressants - SSRI Passed - 06/02/2024  5:41 PM      Passed - Valid encounter within last 6 months    Recent Outpatient Visits           3 weeks ago Type 2 diabetes mellitus with hyperglycemia, without long-term current use of insulin (HCC)   [place] [facility] [place], [place] T, NP   7 months ago Type 2 diabetes mellitus with hyperglycemia, without long-term current use of insulin (HCC)   [place] [facility] [place], Melanie DASEN, NP

## 2024-06-10 ENCOUNTER — Other Ambulatory Visit: Payer: Self-pay | Admitting: Nurse Practitioner

## 2024-06-12 NOTE — Telephone Encounter (Signed)
 Requested Prescriptions  Pending Prescriptions Disp Refills   rosuvastatin  (CRESTOR ) 40 MG tablet [Pharmacy Med Name: ROSUVASTATIN  40MG  TABLETS] 90 tablet 3    Sig: TAKE 1 TABLET(40 MG) BY MOUTH DAILY     Cardiovascular:  Antilipid - Statins 2 Failed - 06/12/2024  1:32 PM      Failed - Lipid Panel in normal range within the last 12 months    Cholesterol, Total  Date Value Ref Range Status  05/08/2024 244 (H) 100 - 199 mg/dL Final   LDL Chol Calc (NIH)  Date Value Ref Range Status  05/08/2024 141 (H) 0 - 99 mg/dL Final   HDL  Date Value Ref Range Status  05/08/2024 47 >39 mg/dL Final   Triglycerides  Date Value Ref Range Status  05/08/2024 306 (H) 0 - 149 mg/dL Final         Passed - Cr in normal range and within 360 days    Creatinine, Ser  Date Value Ref Range Status  05/08/2024 0.97 0.57 - 1.00 mg/dL Final         Passed - Patient is not pregnant      Passed - Valid encounter within last 12 months    Recent Outpatient Visits           1 month ago Type 2 diabetes mellitus with hyperglycemia, without long-term current use of insulin (HCC)   [place] [facility] [place], [place] T, NP   7 months ago Type 2 diabetes mellitus with hyperglycemia, without long-term current use of insulin (HCC)   [place] [facility] [place], Melanie DASEN, NP

## 2024-06-14 DIAGNOSIS — F4321 Adjustment disorder with depressed mood: Secondary | ICD-10-CM | POA: Insufficient documentation

## 2024-06-14 NOTE — Patient Instructions (Signed)
 Be Involved in Caring For Your Health:  Taking Medications When medications are taken as directed, they can greatly improve your health. But if they are not taken as prescribed, they may not work. In some cases, not taking them correctly can be harmful. To help ensure your treatment remains effective and safe, understand your medications and how to take them. Bring your medications to each visit for review by your provider.  Your lab results, notes, and after visit summary will be available on My Chart. We strongly encourage you to use this feature. If lab results are abnormal the clinic will contact you with the appropriate steps. If the clinic does not contact you assume the results are satisfactory. You can always view your results on My Chart. If you have questions regarding your health or results, please contact the clinic during office hours. You can also ask questions on My Chart.  We at [facility] are grateful that you chose us  to provide your care. We strive to provide evidence-based and compassionate care and are always looking for feedback. If you get a survey from the clinic please complete this so we can hear your opinions.  Managing Depression, Adult Depression is a mental health condition that affects your thoughts, feelings, and actions. Being diagnosed with depression can bring you relief if you did not know why you have felt or behaved a certain way. It could also leave you feeling overwhelmed. Finding ways to manage your symptoms can help you feel more positive about your future. How to manage lifestyle changes Being depressed is difficult. Depression can increase the level of everyday stress. Stress can make depression symptoms worse. You may believe your symptoms cannot be managed or will never improve. However, there are many things you can try to help manage your symptoms. There is hope. Managing stress  Stress is your body's reaction to life changes and events,  both good and bad. Stress can add to your feelings of depression. Learning to manage your stress can help lessen your feelings of depression. Try some of the following approaches to reducing your stress (stress reduction techniques): Listen to music that you enjoy and that inspires you. Try using a meditation app or take a meditation class. Develop a practice that helps you connect with your spiritual self. Walk in nature, pray, or go to a place of worship. Practice deep breathing. To do this, inhale slowly through your nose. Pause at the top of your inhale for a few seconds and then exhale slowly, letting yourself relax. Repeat this three or four times. Practice yoga to help relax and work your muscles. Choose a stress reduction technique that works for you. These techniques take time and practice to develop. Set aside 5-15 minutes a day to do them. Therapists can offer training in these techniques. Do these things to help manage stress: Keep a journal. Know your limits. Set healthy boundaries for yourself and others, such as saying no when you think something is too much. Pay attention to how you react to certain situations. You may not be able to control everything, but you can change your reaction. Add humor to your life by watching funny movies or shows. Make time for activities that you enjoy and that relax you. Spend less time using electronics, especially at night before bed. The light from screens can make your brain think it is time to get up rather than go to bed.  Medicines Medicines, such as antidepressants, are often a part of  treatment for depression. Talk with your pharmacist or health care provider about all the medicines, supplements, and herbal products that you take, their possible side effects, and what medicines and other products are safe to take together. Make sure to report any side effects you may have to your health care provider. Relationships Your health care  provider may suggest family therapy, couples therapy, or individual therapy as part of your treatment. How to recognize changes Everyone responds differently to treatment for depression. As you recover from depression, you may start to: Have more interest in doing activities. Feel more hopeful. Have more energy. Eat a more regular amount of food. Have better mental focus. It is important to recognize if your depression is not getting better or is getting worse. The symptoms you had in the beginning may return, such as: Feeling tired. Eating too much or too little. Sleeping too much or too little. Feeling restless, agitated, or hopeless. Trouble focusing or making decisions. Having unexplained aches and pains. Feeling irritable, angry, or aggressive. If you or your family members notice these symptoms coming back, let your health care provider know right away. Follow these instructions at home: Activity Try to get some form of exercise each day, such as walking. Try yoga, mindfulness, or other stress reduction techniques. Participate in group activities if you are able. Lifestyle Get enough sleep. Cut down on or stop using caffeine, tobacco, alcohol, and any other harmful substances. Eat a healthy diet that includes plenty of vegetables, fruits, whole grains, low-fat dairy products, and lean protein. Limit foods that are high in solid fats, added sugar, or salt (sodium). General instructions Take over-the-counter and prescription medicines only as told by your health care provider. Keep all follow-up visits. It is important for your health care provider to check on your mood, behavior, and medicines. Your health care provider may need to make changes to your treatment. Where to find support Talking to others  Friends and family members can be sources of support and guidance. Talk to trusted friends or family members about your condition. Explain your symptoms and let them know that you  are working with a health care provider to treat your depression. Tell friends and family how they can help. Finances Find mental health providers that fit with your financial situation. Talk with your health care provider if you are worried about access to food, housing, or medicine. Call your insurance company to learn about your co-pays and prescription plan. Where to find more information You can find support in your area from: Anxiety and Depression Association of America (ADAA): adaa.org Mental Health America: mentalhealthamerica.net [employer] on Mental Illness: nami.org Contact a health care provider if: You stop taking your antidepressant medicines, and you have any of these symptoms: Nausea. Headache. Light-headedness. Chills and body aches. Not being able to sleep (insomnia). You or your friends and family think your depression is getting worse. Get help right away if: You have thoughts of hurting yourself or others. Get help right away if you feel like you may hurt yourself or others, or have thoughts about taking your own life. Go to your nearest emergency room or: Call 911. Call the National Suicide Prevention Lifeline at (743) 630-7432 or 988. This is open 24 hours a day. Text the Crisis Text Line at 854-590-3828. This information is not intended to replace advice given to you by your health care provider. Make sure you discuss any questions you have with your health care provider. Document Revised: 01/03/2022 Document Reviewed:  01/03/2022 Elsevier Patient Education  2024 [employer].

## 2024-06-19 ENCOUNTER — Encounter: Payer: Self-pay | Admitting: Nurse Practitioner

## 2024-06-19 ENCOUNTER — Ambulatory Visit (INDEPENDENT_AMBULATORY_CARE_PROVIDER_SITE_OTHER): Admitting: Nurse Practitioner

## 2024-06-19 VITALS — BP 105/69 | HR 64 | Temp 98.1°F | Resp 15 | Ht 62.21 in | Wt 155.4 lb

## 2024-06-19 DIAGNOSIS — F411 Generalized anxiety disorder: Secondary | ICD-10-CM

## 2024-06-19 DIAGNOSIS — F4321 Adjustment disorder with depressed mood: Secondary | ICD-10-CM

## 2024-06-19 DIAGNOSIS — G4733 Obstructive sleep apnea (adult) (pediatric): Secondary | ICD-10-CM

## 2024-06-19 DIAGNOSIS — Z23 Encounter for immunization: Secondary | ICD-10-CM

## 2024-06-19 MED ORDER — ROSUVASTATIN CALCIUM 40 MG PO TABS: 40.0000 mg | ORAL_TABLET | Freq: Every day | ORAL | 3 refills | Status: AC

## 2024-06-19 NOTE — Assessment & Plan Note (Signed)
 Chronic, continue to use CPAP nightly.

## 2024-06-19 NOTE — Assessment & Plan Note (Signed)
 Due to loss of mother in April 2025, her mood is improving with Wellbutrin  on board.  Will continue this for now along with her Celexa  and Buspar .  Denies SI/HI.  Adjust regimen as needed based on mood.  Scores improving and she wishes to maintain current dosing.

## 2024-06-19 NOTE — Progress Notes (Signed)
 BP 105/69 (BP Location: Left Arm, Patient Position: Sitting, Cuff Size: Normal)   Pulse 64   Temp 98.1 F (36.7 C) (Oral)   Resp 15   Ht 5' 2.21 (1.58 m)   Wt 155 lb 6.4 oz (70.5 kg)   SpO2 98%   BMI 28.24 kg/m    Subjective:    Patient ID: Karen Lambert, female    DOB: Sep 30, 1960, 63 y.o.   MRN: [id number]  HPI: Karen Lambert is a 63 y.o. female  Chief Complaint  Patient presents with   Depression/Anxiety    Starting to feel better. Has more days that are better than not compared to the past.    Eye doctor    Exams done at [facility]   DEPRESSION/ANXIETY Added on Wellbutrin  to regimen 05/08/24 due to increased depressed mood with loss of mother. Continues Celexa  40 MG daily and Buspar  10 MG BID. She finds Wellbutrin  is helping to keep her calm more, having more good days than bad days. Does noticed heart racing at times. Mood status: stable Satisfied with current treatment?: yes Symptom severity: moderate  Duration of current treatment : chronic Side effects: no Medication compliance: good compliance Psychotherapy/counseling: none Depressed mood: no Anxious mood: no Anhedonia: no Significant weight loss or gain: no Insomnia: yes hard to fall asleep -- takes Trazodone  at night as needed, does have OSA and uses CPAP every night Fatigue: yes Feelings of worthlessness or guilt: no Impaired concentration/indecisiveness: no Suicidal ideations: no Hopelessness: no Crying spells: no    06/19/2024    9:56 AM 05/08/2024    8:57 AM 11/05/2023    8:43 AM 08/08/2023    8:55 AM 05/08/2023    9:38 AM  Depression screen PHQ 2/9  Decreased Interest 1 3 2 2 2   Down, Depressed, Hopeless 1 2 2 1 2   PHQ - 2 Score 2 5 4 3 4   Altered sleeping 2 2 2 3 2   Tired, decreased energy 3 3 2 2 2   Change in appetite 2 1 2 1 2   Feeling bad or failure about yourself  1 2 2 2 2   Trouble concentrating 3 3 2 2  0  Moving slowly or fidgety/restless 1 2 2 1  0  Suicidal thoughts  0 0 0 0   PHQ-9 Score 14 18 16 14 12   Difficult doing work/chores Somewhat difficult Somewhat difficult Somewhat difficult Somewhat difficult Somewhat difficult       06/19/2024    9:56 AM 05/08/2024    8:58 AM 11/05/2023    8:53 AM 08/08/2023    8:55 AM  GAD 7 : Generalized Anxiety Score  Nervous, Anxious, on Edge 2 3 2 1   Control/stop worrying 2 3 3 3   Worry too much - different things 2 3 3 3   Trouble relaxing 2 3 2 2   Restless 1 2 2 1   Easily annoyed or irritable 2 3 2 2   Afraid - awful might happen 1 3 2 2   Total GAD 7 Score 12 20 16 14   Anxiety Difficulty Somewhat difficult Very difficult Somewhat difficult Somewhat difficult   Relevant past medical, surgical, family and social history reviewed and updated as indicated. Interim medical history since our last visit reviewed. Allergies and medications reviewed and updated.  Review of Systems  Constitutional:  Negative for activity change, appetite change, diaphoresis, fatigue and fever.  Respiratory:  Negative for cough, chest tightness and shortness of breath.   Cardiovascular:  Negative for chest pain, palpitations and leg  swelling.  Gastrointestinal: Negative.   Endocrine: Negative for polydipsia, polyphagia and polyuria.  Neurological: Negative.   Psychiatric/Behavioral:  Positive for sleep disturbance. Negative for decreased concentration, self-injury and suicidal ideas. The patient is nervous/anxious.     Per HPI unless specifically indicated above     Objective:    BP 105/69 (BP Location: Left Arm, Patient Position: Sitting, Cuff Size: Normal)   Pulse 64   Temp 98.1 F (36.7 C) (Oral)   Resp 15   Ht 5' 2.21 (1.58 m)   Wt 155 lb 6.4 oz (70.5 kg)   SpO2 98%   BMI 28.24 kg/m   Wt Readings from Last 3 Encounters:  06/19/24 155 lb 6.4 oz (70.5 kg)  05/08/24 157 lb 6.4 oz (71.4 kg)  11/05/23 155 lb (70.3 kg)    Physical Exam Vitals and nursing note reviewed.  Constitutional:      General: She is awake. She is not in  acute distress.    Appearance: She is well-developed and well-groomed. She is not ill-appearing or toxic-appearing.  HENT:     Head: Normocephalic.     Right Ear: Hearing and external ear normal.     Left Ear: Hearing and external ear normal.  Eyes:     General: Lids are normal.        Right eye: No discharge.        Left eye: No discharge.     Conjunctiva/sclera: Conjunctivae normal.     Pupils: Pupils are equal, round, and reactive to light.  Neck:     Thyroid: No thyromegaly.     Vascular: No carotid bruit.  Cardiovascular:     Rate and Rhythm: Normal rate and regular rhythm.     Heart sounds: Normal heart sounds. No murmur heard.    No gallop.  Pulmonary:     Effort: Pulmonary effort is normal. No accessory muscle usage or respiratory distress.     Breath sounds: Normal breath sounds.  Abdominal:     General: Bowel sounds are normal. There is no distension.     Palpations: Abdomen is soft.     Tenderness: There is no abdominal tenderness.  Musculoskeletal:     Cervical back: Normal range of motion and neck supple.     Right lower leg: No edema.     Left lower leg: No edema.  Lymphadenopathy:     Cervical: No cervical adenopathy.  Skin:    General: Skin is warm and dry.  Neurological:     Mental Status: She is alert and oriented to person, place, and time.     Deep Tendon Reflexes: Reflexes are normal and symmetric.     Reflex Scores:      Brachioradialis reflexes are 2+ on the right side and 2+ on the left side.      Patellar reflexes are 2+ on the right side and 2+ on the left side. Psychiatric:        Attention and Perception: Attention normal.        Mood and Affect: Mood normal.        Speech: Speech normal.        Behavior: Behavior normal. Behavior is cooperative.        Thought Content: Thought content normal.     Results for orders placed or performed in visit on 05/08/24  Bayer DCA Hb A1c Waived   Collection Time: 05/08/24  9:05 AM  Result Value Ref  Range   HB A1C (BAYER DCA - WAIVED)  6.6 (H) 4.8 - 5.6 %  Comprehensive metabolic panel with GFR   Collection Time: 05/08/24  9:06 AM  Result Value Ref Range   Glucose 113 (H) 70 - 99 mg/dL   BUN 8 8 - 27 mg/dL   Creatinine, Ser 9.02 0.57 - 1.00 mg/dL   eGFR 66 >40 fO/fpw/8.26   BUN/Creatinine Ratio 8 (L) 12 - 28   Sodium 137 134 - 144 mmol/L   Potassium 4.3 3.5 - 5.2 mmol/L   Chloride 100 96 - 106 mmol/L   CO2 19 (L) 20 - 29 mmol/L   Calcium  9.8 8.7 - 10.3 mg/dL   Total Protein 6.9 6.0 - 8.5 g/dL   Albumin 4.6 3.9 - 4.9 g/dL   Globulin, Total 2.3 1.5 - 4.5 g/dL   Bilirubin Total 0.4 0.0 - 1.2 mg/dL   Alkaline Phosphatase 90 44 - 121 IU/L   AST 29 0 - 40 IU/L   ALT 32 0 - 32 IU/L  Lipid Panel w/o Chol/HDL Ratio   Collection Time: 05/08/24  9:06 AM  Result Value Ref Range   Cholesterol, Total 244 (H) 100 - 199 mg/dL   Triglycerides 693 (H) 0 - 149 mg/dL   HDL 47 >60 mg/dL   VLDL Cholesterol Cal 56 (H) 5 - 40 mg/dL   LDL Chol Calc (NIH) 858 (H) 0 - 99 mg/dL      Assessment & Plan:   Problem List Items Addressed This Visit       Respiratory   OSA (obstructive sleep apnea)   Chronic, continue to use CPAP nightly.        Other   Situational depression - Primary   Due to loss of mother in April 2025, her mood is improving with Wellbutrin  on board.  Will continue this for now along with her Celexa  and Buspar .  Denies SI/HI.  Adjust regimen as needed based on mood.  Scores improving and she wishes to maintain current dosing.      Generalized anxiety disorder   Chronic, ongoing.  Due to loss of mother in April 2025, her mood is improving with Wellbutrin  on board. Denies SI/HI.  Adjust regimen as needed based on mood. Scores improving and she wishes to maintain current dosing. Continue Celexa  40 MG daily and Buspar  to 10 MG BID. Continue Trazodone  PRN 25-50 MG at HS for short period for sleep needs. Discussed various methods of meditation and relaxation techniques at home.   Recommend she speak to [facility] about support groups for grieving.        Follow up plan: Return in about 20 weeks (around 11/06/2024) for Annual Physical after 11/04/24.

## 2024-06-19 NOTE — Assessment & Plan Note (Signed)
 Chronic, ongoing.  Due to loss of mother in April 2025, her mood is improving with Wellbutrin  on board. Denies SI/HI.  Adjust regimen as needed based on mood. Scores improving and she wishes to maintain current dosing. Continue Celexa  40 MG daily and Buspar  to 10 MG BID. Continue Trazodone  PRN 25-50 MG at HS for short period for sleep needs. Discussed various methods of meditation and relaxation techniques at home.  Recommend she speak to [facility] about support groups for grieving.

## 2024-06-26 ENCOUNTER — Other Ambulatory Visit: Payer: Self-pay | Admitting: Nurse Practitioner

## 2024-06-30 NOTE — Telephone Encounter (Signed)
 Requested Prescriptions  Pending Prescriptions Disp Refills   traZODone  (DESYREL ) 100 MG tablet [Pharmacy Med Name: TRAZODONE  100MG  TABLETS] 90 tablet 1    Sig: TAKE 1 TABLET(100 MG) BY MOUTH AT BEDTIME AS NEEDED FOR SLEEP     Psychiatry: Antidepressants - Serotonin Modulator Passed - 06/30/2024  7:46 AM      Passed - Completed PHQ-2 or PHQ-9 in the last 360 days      Passed - Valid encounter within last 6 months    Recent Outpatient Visits           1 week ago Situational depression   [place] [facility] [place], Mondovi T, NP   1 month ago Type 2 diabetes mellitus with hyperglycemia, without long-term current use of insulin (HCC)   [place] [facility] [place], Marion T, NP   7 months ago Type 2 diabetes mellitus with hyperglycemia, without long-term current use of insulin (HCC)   [place] [facility] [place], Melanie DASEN, NP

## 2024-08-18 ENCOUNTER — Encounter: Payer: Self-pay | Admitting: Nurse Practitioner

## 2024-08-19 ENCOUNTER — Encounter: Payer: Self-pay | Admitting: Family Medicine

## 2024-08-19 ENCOUNTER — Ambulatory Visit (INDEPENDENT_AMBULATORY_CARE_PROVIDER_SITE_OTHER): Admitting: Family Medicine

## 2024-08-19 ENCOUNTER — Ambulatory Visit: Payer: Self-pay

## 2024-08-19 VITALS — BP 117/68 | HR 54 | Temp 97.4°F | Ht 62.0 in | Wt 155.4 lb

## 2024-08-19 DIAGNOSIS — J0100 Acute maxillary sinusitis, unspecified: Secondary | ICD-10-CM

## 2024-08-19 MED ORDER — AMOXICILLIN-POT CLAVULANATE 875-125 MG PO TABS: 1.0000 | ORAL_TABLET | Freq: Two times a day (BID) | ORAL | 0 refills | Status: AC

## 2024-08-19 NOTE — Progress Notes (Signed)
 BP 117/68   Pulse (!) 54   Temp (!) 97.4 F (36.3 C) (Oral)   Ht 5' 2 (1.575 m)   Wt 155 lb 6.4 oz (70.5 kg)   SpO2 95%   BMI 28.42 kg/m    Subjective:    Patient ID: Karen Lambert, female    DOB: Mar 20, 1961, 63 y.o.   MRN: [id number]  HPI: Karen Lambert is a 62 y.o. female  Chief Complaint  Patient presents with   Nasal Congestion    Onset 3/4 weeks. OTC meds not much relief  Denies fevers, cough, sore throat,    Ear Pain    Left ear   UPPER RESPIRATORY TRACT INFECTION Duration: 3-4 weeks Worst symptom: congestion  Fever: no Cough: yes Shortness of breath: no Wheezing: no Chest pain: no Chest tightness: no Chest congestion: no Nasal congestion: yes Runny nose: yes Post nasal drip: yes Sneezing: no Sore throat: no Swollen glands: no Sinus pressure: yes Headache: yes Face pain: yes Toothache: no Ear pain: yes left Ear pressure: yes left Eyes red/itching:no Eye drainage/crusting: no  Vomiting: no Rash: no Fatigue: yes Sick contacts: no Strep contacts: no  Context: worse Recurrent sinusitis: no Relief with OTC cold/cough medications: no  Treatments attempted: pseudoephedrine   Relevant past medical, surgical, family and social history reviewed and updated as indicated. Interim medical history since our last visit reviewed. Allergies and medications reviewed and updated.  Review of Systems  Constitutional:  Positive for fatigue. Negative for activity change, appetite change, chills, diaphoresis, fever and unexpected weight change.  HENT:  Positive for congestion, ear pain, postnasal drip, rhinorrhea, sinus pressure, sinus pain and sore throat. Negative for dental problem, drooling, ear discharge, facial swelling, hearing loss, mouth sores, nosebleeds, sneezing, tinnitus, trouble swallowing and voice change.   Eyes: Negative.   Respiratory: Negative.    Cardiovascular: Negative.   Psychiatric/Behavioral: Negative.      Per HPI unless specifically  indicated above     Objective:    BP 117/68   Pulse (!) 54   Temp (!) 97.4 F (36.3 C) (Oral)   Ht 5' 2 (1.575 m)   Wt 155 lb 6.4 oz (70.5 kg)   SpO2 95%   BMI 28.42 kg/m   Wt Readings from Last 3 Encounters:  08/19/24 155 lb 6.4 oz (70.5 kg)  06/19/24 155 lb 6.4 oz (70.5 kg)  05/08/24 157 lb 6.4 oz (71.4 kg)    Physical Exam Vitals and nursing note reviewed.  Constitutional:      General: She is not in acute distress.    Appearance: Normal appearance. She is not ill-appearing, toxic-appearing or diaphoretic.  HENT:     Head: Normocephalic and atraumatic.     Right Ear: Tympanic membrane, ear canal and external ear normal.     Left Ear: Tympanic membrane and external ear normal.     Nose: Congestion and rhinorrhea present.     Mouth/Throat:     Mouth: Mucous membranes are moist.     Pharynx: Oropharynx is clear. No oropharyngeal exudate or posterior oropharyngeal erythema.  Eyes:     General: No scleral icterus.       Right eye: No discharge.        Left eye: No discharge.     Extraocular Movements: Extraocular movements intact.     Conjunctiva/sclera: Conjunctivae normal.     Pupils: Pupils are equal, round, and reactive to light.  Cardiovascular:     Rate and Rhythm: Normal rate and  regular rhythm.     Pulses: Normal pulses.     Heart sounds: Normal heart sounds. No murmur heard.    No friction rub. No gallop.  Pulmonary:     Effort: Pulmonary effort is normal. No respiratory distress.     Breath sounds: Normal breath sounds. No stridor. No wheezing, rhonchi or rales.  Chest:     Chest wall: No tenderness.  Musculoskeletal:        General: Normal range of motion.     Cervical back: Normal range of motion and neck supple.  Skin:    General: Skin is warm and dry.     Capillary Refill: Capillary refill takes less than 2 seconds.     Coloration: Skin is not jaundiced or pale.     Findings: No bruising, erythema, lesion or rash.  Neurological:     General: No  focal deficit present.     Mental Status: She is alert and oriented to person, place, and time. Mental status is at baseline.  Psychiatric:        Mood and Affect: Mood normal.        Behavior: Behavior normal.        Thought Content: Thought content normal.        Judgment: Judgment normal.     Results for orders placed or performed in visit on 05/08/24  Bayer DCA Hb A1c Waived   Collection Time: 05/08/24  9:05 AM  Result Value Ref Range   HB A1C (BAYER DCA - WAIVED) 6.6 (H) 4.8 - 5.6 %  Comprehensive metabolic panel with GFR   Collection Time: 05/08/24  9:06 AM  Result Value Ref Range   Glucose 113 (H) 70 - 99 mg/dL   BUN 8 8 - 27 mg/dL   Creatinine, Ser 9.02 0.57 - 1.00 mg/dL   eGFR 66 >40 fO/fpw/8.26   BUN/Creatinine Ratio 8 (L) 12 - 28   Sodium 137 134 - 144 mmol/L   Potassium 4.3 3.5 - 5.2 mmol/L   Chloride 100 96 - 106 mmol/L   CO2 19 (L) 20 - 29 mmol/L   Calcium  9.8 8.7 - 10.3 mg/dL   Total Protein 6.9 6.0 - 8.5 g/dL   Albumin 4.6 3.9 - 4.9 g/dL   Globulin, Total 2.3 1.5 - 4.5 g/dL   Bilirubin Total 0.4 0.0 - 1.2 mg/dL   Alkaline Phosphatase 90 44 - 121 IU/L   AST 29 0 - 40 IU/L   ALT 32 0 - 32 IU/L  Lipid Panel w/o Chol/HDL Ratio   Collection Time: 05/08/24  9:06 AM  Result Value Ref Range   Cholesterol, Total 244 (H) 100 - 199 mg/dL   Triglycerides 693 (H) 0 - 149 mg/dL   HDL 47 >60 mg/dL   VLDL Cholesterol Cal 56 (H) 5 - 40 mg/dL   LDL Chol Calc (NIH) 858 (H) 0 - 99 mg/dL      Assessment & Plan:   Problem List Items Addressed This Visit   None Visit Diagnoses       Acute non-recurrent maxillary sinusitis    -  Primary   Will treat with augmentin . Call with any concerns. Continue to monitor. Call with any concerns.   Relevant Medications   amoxicillin -clavulanate (AUGMENTIN ) 875-125 MG tablet        Follow up plan: Return for As scheduled.

## 2024-08-19 NOTE — Telephone Encounter (Signed)
 FYI Only or Action Required?: FYI only for provider: appointment scheduled on 08/19/24.  Patient was last seen in primary care on 06/19/2024 by Valerio Melanie DASEN, NP.  Called Nurse Triage reporting Sinusitis.  Symptoms began about a month ago.  Interventions attempted: OTC medications: sudafed sinus.  Symptoms are: gradually worsening.  Triage Disposition: See HCP Within 4 Hours (Or PCP Triage)  Patient/caregiver understands and will follow disposition?: Yes Reason for Disposition  [1] SEVERE sinus pain (e.g., excruciating) AND [2] not improved 2 hours after pain medicine  Answer Assessment - Initial Assessment Questions 3-4 weeks sinus pain under eyes and above brows. Pat at worst is 10/10. Mild improvement with sudafed sinus.   1. LOCATION: Where does it hurt?      Around eyes  2. ONSET: When did the sinus pain start?  (e.g., hours, days)      3-4 weeks  3. SEVERITY: How bad is the pain?   (Scale 0-10; or none, mild, moderate or severe)     Severe, 10/10 at worst  4. NASAL CONGESTION: Is the nose blocked? If Yes, ask: Can you open it or must you breathe through your mouth?     States only mild, intermittent nasal congestion  5. FEVER: Do you have a fever? If Yes, ask: What is it, how was it measured, and when did it start?      Denies  Protocols used: Sinus Pain or Congestion-A-AH  Summary: sinus discomfort / rx req   Reason for Triage: The patient shares that they have had sinus pain and pressure (primarily on their left side) with significant congestion as well. The patient shares that they have experienced discomfort for roughly 3-4 weeks with no relief. The patient would like to be prescribed something for their symptoms.

## 2024-08-31 ENCOUNTER — Other Ambulatory Visit: Payer: Self-pay | Admitting: Nurse Practitioner

## 2024-09-07 ENCOUNTER — Encounter: Payer: Self-pay | Admitting: Nurse Practitioner

## 2024-09-08 MED ORDER — DOXYCYCLINE HYCLATE 100 MG PO TABS: 100.0000 mg | ORAL_TABLET | Freq: Two times a day (BID) | ORAL | 0 refills | Status: AC

## 2024-09-08 MED ORDER — PREDNISONE 20 MG PO TABS: 40.0000 mg | ORAL_TABLET | Freq: Every day | ORAL | 0 refills | Status: AC

## 2024-11-06 ENCOUNTER — Encounter: Admitting: Nurse Practitioner
